# Patient Record
Sex: Female | Born: 1955
Health system: Southern US, Community
[De-identification: ages and names within clinical notes are randomized; demographics above are authoritative.]

## PROBLEM LIST (undated history)

## (undated) DIAGNOSIS — Z9889 Other specified postprocedural states: Secondary | ICD-10-CM

## (undated) DIAGNOSIS — R112 Nausea with vomiting, unspecified: Secondary | ICD-10-CM

## (undated) DIAGNOSIS — M810 Age-related osteoporosis without current pathological fracture: Secondary | ICD-10-CM

## (undated) DIAGNOSIS — Z95810 Presence of automatic (implantable) cardiac defibrillator: Secondary | ICD-10-CM

## (undated) DIAGNOSIS — M1612 Unilateral primary osteoarthritis, left hip: Secondary | ICD-10-CM

## (undated) DIAGNOSIS — I219 Acute myocardial infarction, unspecified: Secondary | ICD-10-CM

## (undated) DIAGNOSIS — G4730 Sleep apnea, unspecified: Secondary | ICD-10-CM

## (undated) HISTORY — PX: CHOLECYSTECTOMY: SHX55

## (undated) HISTORY — PX: KNEE ARTHROSCOPY: SUR90

## (undated) HISTORY — DX: Sleep apnea, unspecified: G47.30

## (undated) HISTORY — PX: SP CHOLECYSTOMY: HXRAD409

## (undated) HISTORY — DX: Age-related osteoporosis without current pathological fracture: M81.0

---

## 2016-06-10 ENCOUNTER — Ambulatory Visit (INDEPENDENT_AMBULATORY_CARE_PROVIDER_SITE_OTHER): Payer: 59 | Admitting: Family Medicine

## 2016-06-10 ENCOUNTER — Encounter: Payer: Self-pay | Admitting: Family Medicine

## 2016-06-10 VITALS — BP 118/74 | HR 72 | Temp 98.1°F | Resp 17 | Ht 67.0 in | Wt 143.0 lb

## 2016-06-10 DIAGNOSIS — H6091 Unspecified otitis externa, right ear: Secondary | ICD-10-CM

## 2016-06-10 MED ORDER — NEOMYCIN-POLYMYXIN-HC 3.5-10000-1 OT SOLN: 3.0000 [drp] | Freq: Four times a day (QID) | OTIC | 1 refills | Status: DC

## 2016-06-10 NOTE — Patient Instructions (Addendum)
It was good to meet you today!  I have sent in the cortisporin otic for your Right ear.  Use this 3-4 drops three to four times a day.   If you're not doing better, come back to see [place] in a week or so. Sooner if worsening.      IF you received an x-ray today, you will receive an invoice from [facility]. Please contact [facility] at [phone] with questions or concerns regarding your invoice.   IF you received labwork today, you will receive an invoice from [employer]. Please contact [facility] at [phone] with questions or concerns regarding your invoice.   Our billing staff will not be able to assist you with questions regarding bills from these companies.  You will be contacted with the lab results as soon as they are available. The fastest way to get your results is to activate your My Chart account. Instructions are located on the last page of this paperwork. If you have not heard from [place] regarding the results in 2 weeks, please contact this office.    We recommend that you schedule a mammogram for breast cancer screening. Typically, you do not need a referral to do this. Please contact a local imaging center to schedule your mammogram.  [facility] - [phone]  *ask for the Radiology Department [facility]) - [phone] or [phone]  [facility] - [phone] [place] [phone] [facility] - [phone]  *ask for the [facility] - [phone]  *ask for the Radiology Department [facility] - [phone]  *ask for the [place] - [phone]

## 2016-06-10 NOTE — Progress Notes (Signed)
Allison Cannon is a 60 y.o. female who presents to Urgent Care today for Ear pain  1.  Right ear pain:  Present for past 2 days.  Resolved this AM.  Hasn't had any further pain today.  No other URI symptoms.  Describes sharp stabbing pain deep within her ear on the Right side.  No trauma.  NO drainage.  No injury.    Husband diagnosed several days ago with otitis externa.  She took some of his vosol drops yesterday evening as well as Alleve, both of which seemed to help. NO fevers or chills.   ROS as above.   PMH reviewed. Patient is a nonsmoker.   No past medical history on file. Past Surgical History:  Procedure Laterality Date  . CHOLECYSTECTOMY      Medications reviewed. No current outpatient prescriptions on file.   No current facility-administered medications for this visit.    Family history of stroke in mother and father. Nonsmoker.    Physical Exam:  BP 118/74 (BP Location: Left Arm, Patient Position: Sitting, Cuff Size: Normal)   Pulse 72   Temp 98.1 F (36.7 C) (Oral)   Resp 17   Ht 5\' 7"  (1.702 m)   Wt 143 lb (64.9 kg)   SpO2 97%   BMI 22.40 kg/m  Gen:  Patient sitting on exam table, appears stated age in no acute distress Head: Normocephalic atraumatic Eyes: EOMI, PERRL, sclera and conjunctiva non-erythematous Ears:  Canals clear with good TM on Left.  Rigth canal with some erythema and edema noted.  TM itself is non-red and normal appearing.  Mouth: Mucosa membranes moist. Tonsils +2, nonenlarged, non-erythematous. Neck: No cervical lymphadenopathy noted Heart:  RRR, no murmurs auscultated. Pulm:  Clear to auscultation bilaterally with good air movement.  No wheezes or rales noted.      Assessment and Plan:  1.  Right otitis externa: - treat with cortisporin otic.  OTC Alleve as needed - she did have some tenderness on ear speculum examination.  - TM not infected.  No evidence of AOM.   - FU if no improvement within the week

## 2018-06-21 DIAGNOSIS — D1801 Hemangioma of skin and subcutaneous tissue: Secondary | ICD-10-CM | POA: Diagnosis not present

## 2018-06-21 DIAGNOSIS — D239 Other benign neoplasm of skin, unspecified: Secondary | ICD-10-CM | POA: Diagnosis not present

## 2018-06-21 DIAGNOSIS — L821 Other seborrheic keratosis: Secondary | ICD-10-CM | POA: Diagnosis not present

## 2019-07-28 DIAGNOSIS — Z1231 Encounter for screening mammogram for malignant neoplasm of breast: Secondary | ICD-10-CM | POA: Diagnosis not present

## 2019-07-28 DIAGNOSIS — Z803 Family history of malignant neoplasm of breast: Secondary | ICD-10-CM | POA: Diagnosis not present

## 2019-11-07 ENCOUNTER — Encounter: Payer: Self-pay | Admitting: Orthopaedic Surgery

## 2019-11-07 ENCOUNTER — Ambulatory Visit: Payer: Self-pay

## 2019-11-07 ENCOUNTER — Other Ambulatory Visit: Payer: Self-pay

## 2019-11-07 ENCOUNTER — Ambulatory Visit (INDEPENDENT_AMBULATORY_CARE_PROVIDER_SITE_OTHER): Payer: BC Managed Care – PPO

## 2019-11-07 ENCOUNTER — Ambulatory Visit (INDEPENDENT_AMBULATORY_CARE_PROVIDER_SITE_OTHER): Payer: BC Managed Care – PPO | Admitting: Orthopaedic Surgery

## 2019-11-07 DIAGNOSIS — M1612 Unilateral primary osteoarthritis, left hip: Secondary | ICD-10-CM

## 2019-11-07 MED ORDER — MELOXICAM 7.5 MG PO TABS: 7.5000 mg | ORAL_TABLET | Freq: Two times a day (BID) | ORAL | 2 refills | Status: DC | PRN

## 2019-11-07 NOTE — Progress Notes (Signed)
Office Visit Note   Patient: Allison Cannon           Date of Birth: 1955/12/03           MRN: [id number] Visit Date: 11/07/2019              Requested by: No referring provider defined for this encounter. PCP: Patient, No Pcp Per   Assessment & Plan: Visit Diagnoses:  1. Primary osteoarthritis of left hip     Plan: Impression is moderate left hip osteoarthritis with recent exacerbation.  Activity restrictions and modifications were discussed today.  Meloxicam prescribed today as well to take as needed.  She is interested in receiving a cortisone injection today which was performed by Dr. Junius Roads.  Patient will follow up with me as needed.  Follow-Up Instructions: Return if symptoms worsen or fail to improve.   Orders:  Orders Placed This Encounter  Procedures  . XR HIP UNILAT W OR W/O PELVIS 2-3 VIEWS LEFT   Meds ordered this encounter  Medications  . meloxicam (MOBIC) 7.5 MG tablet    Sig: Take 1 tablet (7.5 mg total) by mouth 2 (two) times daily as needed for pain.    Dispense:  30 tablet    Refill:  2      Procedures: No procedures performed   Clinical Data: No additional findings.   Subjective: Chief Complaint  Patient presents with  . Left Hip - Pain    Allison Cannon is a very pleasant 64 year old female who is the neighbor Allison Cannon who comes in for evaluation and treatment of chronic left hip pain that has progressively gotten worse for years and especially over the last several weeks.  She feels mainly pain in the groin sometimes will radiate into the buttock.  Occasionally it'll radiate down into the knee and some burning pain in the calf but she doesn't relate endorse any significant back pain.  Denies any previous history of injury or surgery to the left hip.  Advil and Aleve do provide temporary relief.  She is now having trouble sleeping at night due to the pain.   Review of Systems  Constitutional: Negative.   HENT: Negative.   Eyes: Negative.     Respiratory: Negative.   Cardiovascular: Negative.   Endocrine: Negative.   Musculoskeletal: Negative.   Neurological: Negative.   Hematological: Negative.   Psychiatric/Behavioral: Negative.   All other systems reviewed and are negative.    Objective: Vital Signs: There were no vitals taken for this visit.  Physical Exam Vitals and nursing note reviewed.  Constitutional:      Appearance: She is well-developed.  HENT:     Head: Normocephalic and atraumatic.  Pulmonary:     Effort: Pulmonary effort is normal.  Abdominal:     Palpations: Abdomen is soft.  Musculoskeletal:     Cervical back: Neck supple.  Skin:    General: Skin is warm.     Capillary Refill: Capillary refill takes less than 2 seconds.  Neurological:     Mental Status: She is alert and oriented to person, place, and time.  Psychiatric:        Behavior: Behavior normal.        Thought Content: Thought content normal.        Judgment: Judgment normal.     Ortho Exam Left hip exam shows pain with logroll.  Mildly positive Stinchfield and no significant pain with internal and external rotation.  No bony tenderness. Specialty Comments:  No specialty  comments available.  Imaging: No results found.   PMFS History: Patient Active Problem List   Diagnosis Date Noted  . Primary osteoarthritis of left hip 11/07/2019   History reviewed. No pertinent past medical history.  Family History  Problem Relation Age of Onset  . Cancer Mother   . Stroke Mother   . Stroke Father   . Cancer Sister     Past Surgical History:  Procedure Laterality Date  . CHOLECYSTECTOMY     Social History   Occupational History  . Not on file  Tobacco Use  . Smoking status: Never Smoker  . Smokeless tobacco: Never Used  Substance and Sexual Activity  . Alcohol use: Not on file  . Drug use: Not on file  . Sexual activity: Not on file

## 2019-11-07 NOTE — Addendum Note (Signed)
Addended by: Marlyne Beards on: 11/07/2019 01:53 PM   Modules accepted: Orders

## 2019-11-07 NOTE — Progress Notes (Signed)
Subjective: Patient is here for ultrasound-guided intra-articular left hip injection.   Anterior and posterior pain.  Objective:  Good ROM but pain with IR.  Procedure: Ultrasound-guided left hip injection: After sterile prep with Betadine, injected 8 cc 1% lidocaine without epinephrine and 40 mg methylprednisolone using a 22-gauge spinal needle, passing the needle through the iliofemoral ligament into the femoral head/neck junction.  Injectate seen filling joint capsule.  Moderate immediate relief.

## 2020-01-02 ENCOUNTER — Encounter: Payer: Self-pay | Admitting: Orthopaedic Surgery

## 2020-01-02 ENCOUNTER — Other Ambulatory Visit: Payer: Self-pay

## 2020-01-02 ENCOUNTER — Ambulatory Visit: Payer: Self-pay

## 2020-01-02 ENCOUNTER — Ambulatory Visit (INDEPENDENT_AMBULATORY_CARE_PROVIDER_SITE_OTHER): Payer: BC Managed Care – PPO | Admitting: Orthopaedic Surgery

## 2020-01-02 DIAGNOSIS — G8929 Other chronic pain: Secondary | ICD-10-CM | POA: Diagnosis not present

## 2020-01-02 DIAGNOSIS — M1612 Unilateral primary osteoarthritis, left hip: Secondary | ICD-10-CM

## 2020-01-02 DIAGNOSIS — M545 Low back pain: Secondary | ICD-10-CM

## 2020-01-02 MED ORDER — PREDNISONE 10 MG (21) PO TBPK: ORAL_TABLET | ORAL | 0 refills | Status: DC

## 2020-01-02 MED ORDER — METHOCARBAMOL 500 MG PO TABS: 500.0000 mg | ORAL_TABLET | Freq: Two times a day (BID) | ORAL | 0 refills | Status: DC | PRN

## 2020-01-02 NOTE — Progress Notes (Signed)
Office Visit Note   Patient: Allison Cannon           Date of Birth: 11/20/1955           MRN: [id number] Visit Date: 01/02/2020              Requested by: No referring provider defined for this encounter. PCP: Patient, No Pcp Per   Assessment & Plan: Visit Diagnoses:  1. Chronic low back pain, unspecified back pain laterality, unspecified whether sciatica present   2. Unilateral primary osteoarthritis, left hip     Plan: Impression is left lower extremity radiculopathy with underlying left hip osteoarthritis.  At this point we will start the patient on a steroid and muscle relaxer and start her in outpatient physical therapy.  She would like to see Carrolyn Leigh of written her a prescription for this.  She will follow up with [place] as needed.  Follow-Up Instructions: Return if symptoms worsen or fail to improve.   Orders:  Orders Placed This Encounter  Procedures  . XR Lumbar Spine 2-3 Views   Meds ordered this encounter  Medications  . predniSONE (STERAPRED UNI-PAK 21 TAB) 10 MG (21) TBPK tablet    Sig: Take as directed    Dispense:  21 tablet    Refill:  0  . methocarbamol (ROBAXIN) 500 MG tablet    Sig: Take 1 tablet (500 mg total) by mouth 2 (two) times daily as needed.    Dispense:  20 tablet    Refill:  0      Procedures: No procedures performed   Clinical Data: No additional findings.   Subjective: Chief Complaint  Patient presents with  . Lower Back - Pain  . Left Hip - Pain    HPI patient is a pleasant 64 year old female comes in today with continued left lower extremity pain.  The pain she has starts in the left buttocks and radiates down the posterior lateral thigh and into the shin.  She does note occasional pain into the left groin and anterior thigh.  She has had this for the past several years and has progressively worsened.  She remembers to remote injuries 1 from slipping and falling on the ice in the morning falling pretty hard on her left  buttock when she was going down a set of stairs at home.  Both injuries were years ago.  Her pain is aggravated when she is walking for an extended period of time.  She also has pain while sleeping at night.  This does make it difficult to get comfortable.  She notes burning to the anterolateral shin on the left.  No bowel or bladder change and no saddle paresthesias.  She was seen by Dr. Junius Roads where her left hip was injected on 11/07/2019.  She noted moderate relief of symptoms only during the anesthetic phase.  Review of Systems as detailed in HPI.  All others reviewed and are negative.   Objective: Vital Signs: There were no vitals taken for this visit.  Physical Exam well-developed well-nourished female no acute distress.  Alert oriented x3.  Ortho Exam examination of the left hip reveals a minimally positive logroll.  Markedly positive straight leg raise.  Increased pain with lumbar flexion.  Mild tenderness to the left and right lower lumbar paraspinous musculature.  No focal weakness.  She is neurovascular intact distally.  Specialty Comments:  No specialty comments available.  Imaging: XR Lumbar Spine 2-3 Views  Result Date: 01/02/2020 Marked degenerative changes L5-S1  PMFS History: Patient Active Problem List   Diagnosis Date Noted  . Primary osteoarthritis of left hip 11/07/2019   History reviewed. No pertinent past medical history.  Family History  Problem Relation Age of Onset  . Cancer Mother   . Stroke Mother   . Stroke Father   . Cancer Sister     Past Surgical History:  Procedure Laterality Date  . CHOLECYSTECTOMY     Social History   Occupational History  . Not on file  Tobacco Use  . Smoking status: Never Smoker  . Smokeless tobacco: Never Used  Substance and Sexual Activity  . Alcohol use: Not on file  . Drug use: Not on file  . Sexual activity: Not on file

## 2020-01-11 DIAGNOSIS — M545 Low back pain: Secondary | ICD-10-CM | POA: Diagnosis not present

## 2020-01-17 DIAGNOSIS — M545 Low back pain: Secondary | ICD-10-CM | POA: Diagnosis not present

## 2020-01-24 DIAGNOSIS — M545 Low back pain: Secondary | ICD-10-CM | POA: Diagnosis not present

## 2020-02-01 DIAGNOSIS — M545 Low back pain: Secondary | ICD-10-CM | POA: Diagnosis not present

## 2020-02-08 DIAGNOSIS — M545 Low back pain: Secondary | ICD-10-CM | POA: Diagnosis not present

## 2020-02-20 DIAGNOSIS — M545 Low back pain: Secondary | ICD-10-CM | POA: Diagnosis not present

## 2020-02-24 ENCOUNTER — Ambulatory Visit: Payer: BC Managed Care – PPO

## 2020-02-29 DIAGNOSIS — M545 Low back pain: Secondary | ICD-10-CM | POA: Diagnosis not present

## 2020-03-19 ENCOUNTER — Emergency Department (HOSPITAL_COMMUNITY): Payer: BC Managed Care – PPO

## 2020-03-19 ENCOUNTER — Inpatient Hospital Stay (HOSPITAL_COMMUNITY): Payer: BC Managed Care – PPO

## 2020-03-19 ENCOUNTER — Inpatient Hospital Stay (HOSPITAL_COMMUNITY)
Admission: EM | Admit: 2020-03-19 | Discharge: 2020-03-22 | DRG: 224 | Disposition: A | Discharge status: Home / Self Care | Payer: BC Managed Care – PPO | Attending: Internal Medicine | Admitting: Internal Medicine

## 2020-03-19 ENCOUNTER — Encounter (HOSPITAL_COMMUNITY): Payer: Self-pay | Admitting: Cardiology

## 2020-03-19 ENCOUNTER — Other Ambulatory Visit: Payer: Self-pay

## 2020-03-19 DIAGNOSIS — R404 Transient alteration of awareness: Secondary | ICD-10-CM | POA: Diagnosis not present

## 2020-03-19 DIAGNOSIS — Z95810 Presence of automatic (implantable) cardiac defibrillator: Secondary | ICD-10-CM | POA: Diagnosis not present

## 2020-03-19 DIAGNOSIS — R61 Generalized hyperhidrosis: Secondary | ICD-10-CM | POA: Diagnosis not present

## 2020-03-19 DIAGNOSIS — I4901 Ventricular fibrillation: Secondary | ICD-10-CM | POA: Diagnosis not present

## 2020-03-19 DIAGNOSIS — R7989 Other specified abnormal findings of blood chemistry: Secondary | ICD-10-CM

## 2020-03-19 DIAGNOSIS — Z20822 Contact with and (suspected) exposure to covid-19: Secondary | ICD-10-CM | POA: Diagnosis not present

## 2020-03-19 DIAGNOSIS — R4781 Slurred speech: Secondary | ICD-10-CM | POA: Diagnosis present

## 2020-03-19 DIAGNOSIS — I428 Other cardiomyopathies: Secondary | ICD-10-CM | POA: Diagnosis not present

## 2020-03-19 DIAGNOSIS — G8929 Other chronic pain: Secondary | ICD-10-CM | POA: Diagnosis present

## 2020-03-19 DIAGNOSIS — R110 Nausea: Secondary | ICD-10-CM | POA: Diagnosis not present

## 2020-03-19 DIAGNOSIS — M79605 Pain in left leg: Secondary | ICD-10-CM | POA: Diagnosis present

## 2020-03-19 DIAGNOSIS — Z823 Family history of stroke: Secondary | ICD-10-CM | POA: Diagnosis not present

## 2020-03-19 DIAGNOSIS — R7401 Elevation of levels of liver transaminase levels: Secondary | ICD-10-CM | POA: Diagnosis not present

## 2020-03-19 DIAGNOSIS — R569 Unspecified convulsions: Secondary | ICD-10-CM | POA: Diagnosis not present

## 2020-03-19 DIAGNOSIS — J90 Pleural effusion, not elsewhere classified: Secondary | ICD-10-CM | POA: Diagnosis not present

## 2020-03-19 DIAGNOSIS — G4089 Other seizures: Secondary | ICD-10-CM | POA: Diagnosis present

## 2020-03-19 DIAGNOSIS — R1111 Vomiting without nausea: Secondary | ICD-10-CM | POA: Diagnosis not present

## 2020-03-19 DIAGNOSIS — M1612 Unilateral primary osteoarthritis, left hip: Secondary | ICD-10-CM | POA: Diagnosis present

## 2020-03-19 DIAGNOSIS — R270 Ataxia, unspecified: Secondary | ICD-10-CM | POA: Diagnosis not present

## 2020-03-19 DIAGNOSIS — R739 Hyperglycemia, unspecified: Secondary | ICD-10-CM | POA: Diagnosis present

## 2020-03-19 DIAGNOSIS — I469 Cardiac arrest, cause unspecified: Secondary | ICD-10-CM

## 2020-03-19 DIAGNOSIS — R55 Syncope and collapse: Secondary | ICD-10-CM | POA: Diagnosis not present

## 2020-03-19 DIAGNOSIS — R570 Cardiogenic shock: Secondary | ICD-10-CM | POA: Diagnosis not present

## 2020-03-19 DIAGNOSIS — Z9049 Acquired absence of other specified parts of digestive tract: Secondary | ICD-10-CM | POA: Diagnosis not present

## 2020-03-19 DIAGNOSIS — D3610 Benign neoplasm of peripheral nerves and autonomic nervous system, unspecified: Secondary | ICD-10-CM | POA: Diagnosis not present

## 2020-03-19 DIAGNOSIS — I462 Cardiac arrest due to underlying cardiac condition: Secondary | ICD-10-CM | POA: Diagnosis present

## 2020-03-19 DIAGNOSIS — D333 Benign neoplasm of cranial nerves: Secondary | ICD-10-CM | POA: Diagnosis present

## 2020-03-19 DIAGNOSIS — E876 Hypokalemia: Secondary | ICD-10-CM | POA: Diagnosis not present

## 2020-03-19 DIAGNOSIS — I639 Cerebral infarction, unspecified: Secondary | ICD-10-CM | POA: Diagnosis not present

## 2020-03-19 DIAGNOSIS — R29818 Other symptoms and signs involving the nervous system: Secondary | ICD-10-CM | POA: Diagnosis not present

## 2020-03-19 HISTORY — DX: Unilateral primary osteoarthritis, left hip: M16.12

## 2020-03-19 LAB — COMPREHENSIVE METABOLIC PANEL
ALT: 631 U/L — ABNORMAL HIGH (ref 0–44)
AST: 573 U/L — ABNORMAL HIGH (ref 15–41)
Albumin: 3.9 g/dL (ref 3.5–5.0)
Alkaline Phosphatase: 83 U/L (ref 38–126)
Anion gap: 16 — ABNORMAL HIGH (ref 5–15)
BUN: 10 mg/dL (ref 8–23)
CO2: 20 mmol/L — ABNORMAL LOW (ref 22–32)
Calcium: 9.4 mg/dL (ref 8.9–10.3)
Chloride: 103 mmol/L (ref 98–111)
Creatinine, Ser: 0.86 mg/dL (ref 0.44–1.00)
GFR calc Af Amer: >60 mL/min (ref >60)
GFR calc non Af Amer: >60 mL/min (ref >60)
Glucose, Bld: 214 mg/dL — ABNORMAL HIGH (ref 70–99)
Potassium: 3.3 mmol/L — ABNORMAL LOW (ref 3.5–5.1)
Sodium: 139 mmol/L (ref 135–145)
Total Bilirubin: 0.7 mg/dL (ref 0.3–1.2)
Total Protein: 6.4 g/dL — ABNORMAL LOW (ref 6.5–8.1)

## 2020-03-19 LAB — CBC
HCT: 41.4 % (ref 36.0–46.0)
Hemoglobin: 14 g/dL (ref 12.0–15.0)
MCH: 29.7 pg (ref 26.0–34.0)
MCHC: 33.8 g/dL (ref 30.0–36.0)
MCV: 87.9 fL (ref 80.0–100.0)
Platelets: 256 10*3/uL (ref 150–400)
RBC: 4.71 MIL/uL (ref 3.87–5.11)
RDW: 12.4 % (ref 11.5–15.5)
WBC: 11.5 10*3/uL — ABNORMAL HIGH (ref 4.0–10.5)
nRBC: 0 % (ref 0.0–0.2)

## 2020-03-19 LAB — I-STAT CHEM 8, ED
BUN: 10 mg/dL (ref 8–23)
Calcium, Ion: 1.08 mmol/L — ABNORMAL LOW (ref 1.15–1.40)
Chloride: 103 mmol/L (ref 98–111)
Creatinine, Ser: 0.6 mg/dL (ref 0.44–1.00)
Glucose, Bld: 209 mg/dL — ABNORMAL HIGH (ref 70–99)
HCT: 40 % (ref 36.0–46.0)
Hemoglobin: 13.6 g/dL (ref 12.0–15.0)
Potassium: 3.3 mmol/L — ABNORMAL LOW (ref 3.5–5.1)
Sodium: 139 mmol/L (ref 135–145)
TCO2: 22 mmol/L (ref 22–32)

## 2020-03-19 LAB — ECHOCARDIOGRAM COMPLETE
Height: 67 in
Weight: 2160 oz

## 2020-03-19 LAB — DIFFERENTIAL
Abs Immature Granulocytes: 0.1 10*3/uL — ABNORMAL HIGH (ref 0.00–0.07)
Basophils Absolute: 0.1 10*3/uL (ref 0.0–0.1)
Basophils Relative: 1 %
Eosinophils Absolute: 0.1 10*3/uL (ref 0.0–0.5)
Eosinophils Relative: 1 %
Immature Granulocytes: 1 %
Lymphocytes Relative: 27 %
Lymphs Abs: 3.1 10*3/uL (ref 0.7–4.0)
Monocytes Absolute: 0.6 10*3/uL (ref 0.1–1.0)
Monocytes Relative: 5 %
Neutro Abs: 7.5 10*3/uL (ref 1.7–7.7)
Neutrophils Relative %: 65 %

## 2020-03-19 LAB — ETHANOL: Alcohol, Ethyl (B): <10 mg/dL (ref <10)

## 2020-03-19 LAB — TSH: TSH: 1.195 u[IU]/mL (ref 0.350–4.500)

## 2020-03-19 LAB — HEMOGLOBIN A1C
Hgb A1c MFr Bld: 5.5 % (ref 4.8–5.6)
Mean Plasma Glucose: 111.15 mg/dL

## 2020-03-19 LAB — TROPONIN I (HIGH SENSITIVITY)
Troponin I (High Sensitivity): 8 ng/L (ref <18)
Troponin I (High Sensitivity): 88 ng/L — ABNORMAL HIGH (ref <18)

## 2020-03-19 LAB — APTT: aPTT: 24 seconds (ref 24–36)

## 2020-03-19 LAB — PROTIME-INR
INR: 1 (ref 0.8–1.2)
Prothrombin Time: 13.2 seconds (ref 11.4–15.2)

## 2020-03-19 LAB — CK: Total CK: 82 U/L (ref 38–234)

## 2020-03-19 LAB — MAGNESIUM: Magnesium: 1.6 mg/dL — ABNORMAL LOW (ref 1.7–2.4)

## 2020-03-19 LAB — D-DIMER, QUANTITATIVE: D-Dimer, Quant: 10.36 ug/mL-FEU — ABNORMAL HIGH (ref 0.00–0.50)

## 2020-03-19 LAB — T4, FREE: Free T4: 1.12 ng/dL (ref 0.61–1.12)

## 2020-03-19 LAB — SARS CORONAVIRUS 2 BY RT PCR (HOSPITAL ORDER, PERFORMED IN ~~LOC~~ HOSPITAL LAB): SARS Coronavirus 2: NEGATIVE

## 2020-03-19 MED ORDER — IOHEXOL 350 MG/ML SOLN
100.0000 mL | Freq: Once | INTRAVENOUS | Status: AC | PRN
  Administered 2020-03-19: 75 mL via INTRAVENOUS

## 2020-03-19 MED ORDER — DIPHENHYDRAMINE HCL 25 MG PO CAPS: 25.0000 mg | ORAL_CAPSULE | Freq: Four times a day (QID) | ORAL | Status: DC | PRN

## 2020-03-19 MED ORDER — ONDANSETRON HCL 4 MG/2ML IJ SOLN: 4.0000 mg | Freq: Once | INTRAMUSCULAR | Status: DC

## 2020-03-19 MED ORDER — POTASSIUM CHLORIDE CRYS ER 20 MEQ PO TBCR
40.0000 meq | EXTENDED_RELEASE_TABLET | Freq: Once | ORAL | Status: AC
  Administered 2020-03-19: 40 meq via ORAL
  Filled 2020-03-19: qty 2

## 2020-03-19 MED ORDER — PROMETHAZINE HCL 25 MG/ML IJ SOLN
12.5000 mg | Freq: Once | INTRAMUSCULAR | Status: AC
  Administered 2020-03-19: 12.5 mg via INTRAVENOUS
  Filled 2020-03-19: qty 1

## 2020-03-19 MED ORDER — ENOXAPARIN SODIUM 60 MG/0.6ML ~~LOC~~ SOLN
1.0000 mg/kg | Freq: Two times a day (BID) | SUBCUTANEOUS | Status: DC
  Administered 2020-03-19 – 2020-03-20 (×2): 60 mg via SUBCUTANEOUS
  Filled 2020-03-19 (×3): qty 0.6

## 2020-03-19 MED ORDER — LORAZEPAM 2 MG/ML IJ SOLN
0.5000 mg | Freq: Once | INTRAMUSCULAR | Status: AC
  Administered 2020-03-19: 0.5 mg via INTRAVENOUS
  Filled 2020-03-19: qty 1

## 2020-03-19 MED ORDER — ASPIRIN EC 81 MG PO TBEC
81.0000 mg | DELAYED_RELEASE_TABLET | Freq: Every day | ORAL | Status: DC
  Administered 2020-03-19 – 2020-03-22 (×3): 81 mg via ORAL
  Filled 2020-03-19 (×3): qty 1

## 2020-03-19 MED ORDER — ACETAMINOPHEN 650 MG RE SUPP: 650.0000 mg | Freq: Four times a day (QID) | RECTAL | Status: DC | PRN

## 2020-03-19 MED ORDER — DIPHENHYDRAMINE-APAP (SLEEP) 25-500 MG PO TABS: 1.0000 | ORAL_TABLET | Freq: Every evening | ORAL | Status: DC | PRN

## 2020-03-19 MED ORDER — ACETAMINOPHEN 325 MG PO TABS
650.0000 mg | ORAL_TABLET | Freq: Four times a day (QID) | ORAL | Status: DC | PRN
  Administered 2020-03-19 – 2020-03-22 (×4): 650 mg via ORAL
  Filled 2020-03-19 (×5): qty 2

## 2020-03-19 MED ORDER — SODIUM CHLORIDE 0.9 % IV BOLUS
1000.0000 mL | Freq: Once | INTRAVENOUS | Status: AC
  Administered 2020-03-19: 1000 mL via INTRAVENOUS

## 2020-03-19 MED ORDER — ONDANSETRON HCL 4 MG/2ML IJ SOLN
INTRAMUSCULAR | Status: AC
  Administered 2020-03-19: 4 mg
  Filled 2020-03-19: qty 2

## 2020-03-19 MED ORDER — MAGNESIUM SULFATE 2 GM/50ML IV SOLN
2.0000 g | Freq: Once | INTRAVENOUS | Status: AC
  Administered 2020-03-19: 2 g via INTRAVENOUS
  Filled 2020-03-19: qty 50

## 2020-03-19 MED ORDER — ENOXAPARIN SODIUM 40 MG/0.4ML ~~LOC~~ SOLN: 40.0000 mg | SUBCUTANEOUS | Status: DC

## 2020-03-19 NOTE — Consult Note (Addendum)
Cardiology Consultation:   Patient ID: Allison Cannon MRN: [id number]; DOB: 05-17-1956 Pt with 2 medical record numbers.  Admit date: 03/19/2020 Date of Consult: 03/19/2020  Primary Care Provider: Patient, No Pcp Per Primary Cardiologist: No primary care provider on file. new Primary Electrophysiologist:  None    Patient Profile:   Allison Cannon is a 64 y.o. female with a hx of unknown PMH who is being seen today for the evaluation of cardiac arrest at the request of Dr Sedonia Small.  History of Present Illness:   Allison Cannon was in step class at [facility] and collapsed, had 5 min of CPR and 1 shock given from [place].  Pulse present with EMS arrival. But jerking behavior and speech disturbance.     +nausea, has rec'd Zofran and phergan for severe nausea.  CODE Stroke called. CT of head with no acute issues.  Per neuro enroute to hospital she had head trun to left and some upper and lower body jerking. She was diaphoretic and incontinent.  She was slow to respond initially but improved. + nausea.  Usually in good health with biking yesterday about 7 miles and swimming.  She does walk up to 7-9 miles at times.  No chest pain prior to this event. No SOB.  Drinks 2 cups of coffee per day.  Was in her usual state of health. No pre-warning.  Never to her knowledge did she have COVID and has had vaccine.    Now with chest pain due to CPR, increases with palpation.   EKG:  The EKG was personally reviewed and demonstrates:  ST at 101, RAD, no acute ST elevation.  Telemetry:  Telemetry was personally reviewed and demonstrates:  SR CXR negative portable chest.  CTA of head and neck IMPRESSION: 1. Negative for large vessel occlusion. 2. Mild for age atherosclerosis, most pronounced in the dominant left vertebral V4 segment. No significant arterial stenosis in the head or neck.   Labs: Troponin hs 8 second  INR 1.0 ETOH <10 hgb 14, WBC 11.5 plts 256  Na 139, K+ 3.3, C02 20, glucose 214, Cr 0.86, AST 573  ALT 631  Gap 16   Past Medical History:  Diagnosis Date   Arthritis of left hip     Past Surgical History:  Procedure Laterality Date   SP CHOLECYSTOMY       Home Medications:  Prior to Admission medications   Medication Sig Start Date End Date Taking? Authorizing Provider  diphenhydrAMINE (BENADRYL) 25 MG tablet Take 25 mg by mouth every 6 (six) hours as needed for allergies (cough).   Yes [provider]  diphenhydramine-acetaminophen (TYLENOL PM) 25-500 MG TABS tablet Take 1 tablet by mouth at bedtime as needed (pain).   Yes [provider]  ibuprofen (ADVIL) 200 MG tablet Take 200 mg by mouth every 6 (six) hours as needed for moderate pain.   Yes [provider]  naproxen sodium (ALEVE) 220 MG tablet Take 220 mg by mouth daily as needed (pain).   Yes [provider]  Also Vitamin C and D at times and some zinc   Inpatient Medications: Scheduled Meds:  ondansetron (ZOFRAN) IV  4 mg Intravenous Once   potassium chloride  40 mEq Oral Once   Continuous Infusions:  PRN Meds:   Allergies:   Not on File  Social History:   Social History   Socioeconomic History   Marital status: Married    Spouse name: Not on file   Number of children: Not  on file   Years of education: Not on file   Highest education level: Not on file  Occupational History   Not on file  Tobacco Use   Smoking status: Never Smoker   Smokeless tobacco: Never Used  Substance and Sexual Activity   Alcohol use: Not Currently   Drug use: Never   Sexual activity: Not on file  Other Topics Concern   Not on file  Social History Narrative   Not on file   Social Determinants of Health   Financial Resource Strain:    Difficulty of Paying Living Expenses:   Food Insecurity:    Worried About [place] in the Last Year:    Arboriculturist in the Last Year:   Transportation Needs:    Film/video editor (Medical):    Lack of  Transportation (Non-Medical):   Physical Activity:    Days of Exercise per Week:    Minutes of Exercise per Session:   Stress:    Feeling of Stress :   Social Connections:    Frequency of Communication with Friends and Family:    Frequency of Social Gatherings with Friends and Family:    Attends Religious Services:    Active Member of Clubs or Organizations:    Attends Music therapist:    Marital Status:   Intimate Partner Violence:    Fear of Current or Ex-Partner:    Emotionally Abused:    Physically Abused:    Sexually Abused:     Family History:    Family History  Problem Relation Age of Onset   Stroke Mother    Stroke Father      ROS:  Please see the history of present illness.  General:no colds or fevers, no weight changes Skin:no rashes or ulcers HEENT:no blurred vision, no congestion CV:see HPI PUL:see HPI GI:no diarrhea constipation or melena, no indigestion GU:no hematuria, no dysuria MS:no joint pain, no claudication Neuro:no syncope, no lightheadedness- cardiac arrest today. Endo:no diabetes, no thyroid disease  All other ROS reviewed and negative.     Physical Exam/Data:   Vitals:   03/19/20 1000 03/19/20 1003  BP: 128/75 128/75  Pulse: 91 99  Resp: 16 17  Temp:  97.8 F (36.6 C)  TempSrc:  Oral  SpO2:  100%   No intake or output data in the 24 hours ending 03/19/20 1225 No flowsheet data found.   There is no height or weight on file to calculate BMI.  General:  Thin female, in no acute distress though somewhat stunned HEENT: normal Lymph: no adenopathy Neck: no JVD Endocrine:  No thryomegaly Vascular: No carotid bruits; pedal pulses 2+ bilaterally  Cardiac:  normal S1, S2; RRR; no murmur gallup rub or click Lungs:  clear to auscultation bilaterally, no wheezing, rhonchi or rales  Abd: soft, nontender, no hepatomegaly  Ext: no edema Musculoskeletal:  No deformities, BUE and BLE strength normal and  equal Skin: warm and dry  Neuro:  Alert and oriented X 3 MAE follows commands , no focal abnormalities noted Psych:  Normal affect     Relevant CV Studies: Echo pending  Laboratory Data:  High Sensitivity Troponin:   Recent Labs  Lab 03/19/20 1008  TROPONINIHS 8     Chemistry Recent Labs  Lab 03/19/20 1008 03/19/20 1015  NA 139 139  K 3.3* 3.3*  CL 103 103  CO2 20*  --   GLUCOSE 214* 209*  BUN 10 10  CREATININE 0.86 0.60  CALCIUM 9.4  --   GFRNONAA >60  --   GFRAA >60  --   ANIONGAP 16*  --     Recent Labs  Lab 03/19/20 1008  PROT 6.4*  ALBUMIN 3.9  AST 573*  ALT 631*  ALKPHOS 83  BILITOT 0.7   Hematology Recent Labs  Lab 03/19/20 1008 03/19/20 1015  WBC 11.5*  --   RBC 4.71  --   HGB 14.0 13.6  HCT 41.4 40.0  MCV 87.9  --   MCH 29.7  --   MCHC 33.8  --   RDW 12.4  --   PLT 256  --    BNPNo results for input(s): BNP, PROBNP in the last 168 hours.  DDimer No results for input(s): DDIMER in the last 168 hours.   Radiology/Studies:  CT ANGIO NECK W OR WO CONTRAST  Result Date: 03/19/2020 CLINICAL DATA:  64 year old female code stroke presentation. EXAM: CT ANGIOGRAPHY HEAD AND NECK TECHNIQUE: Multidetector CT imaging of the head and neck was performed using the standard protocol during bolus administration of intravenous contrast. Multiplanar CT image reconstructions and MIPs were obtained to evaluate the vascular anatomy. Carotid stenosis measurements (when applicable) are obtained utilizing NASCET criteria, using the distal internal carotid diameter as the denominator. CONTRAST:  31mL OMNIPAQUE IOHEXOL 350 MG/ML SOLN COMPARISON:  Plain head CT  1014 hours today. FINDINGS: CTA NECK Skeleton: Chronic C5-C6 disc and endplate degeneration in the cervical spine. No acute osseous abnormality identified. Upper chest: Negative. Other neck: Negative. Aortic arch: 3 vessel arch configuration. No arch atherosclerosis identified. Right carotid system: Negative.  Left carotid system: Negative. Vertebral arteries: Proximal right subclavian artery and right vertebral artery origin are normal. The right vertebral is mildly tortuous and patent to the skull base without stenosis. No proximal left subclavian artery or left vertebral artery origin plaque or stenosis. Mildly dominant left vertebral is tortuous and patent to the skull base without stenosis. CTA HEAD A multiphase intracranial CTA was performed although accidentally, the vertex was excluded. Posterior circulation: Dominant left V4 segment with mild calcified plaque, only mild left V4 segment stenosis. Minimal right V4 plaque without stenosis. Normal right PICA origin. Patent vertebrobasilar junction is tortuous without stenosis. Patent basilar artery without stenosis. Normal SCA and PCA origins. Posterior communicating arteries are diminutive or absent. Bilateral PCA branches are within normal limits. Anterior circulation: Both ICA siphons are patent. Minimal siphon plaque and no stenosis. Patent carotid termini. Normal MCA and ACA origins. Anterior communicating artery and visible ACA branches are within normal limits. Left MCA M1 segment and bifurcation are patent without stenosis. Left MCA branches are within normal limits. Right MCA M1 segment and trifurcation are patent. There is no right M2 branch occlusion. And no convincing right MCA branch occlusion. Multiphase: No additional findings. Venous sinuses: Superior sagittal sinus not included. Otherwise patent. Anatomic variants: Dominant left vertebral artery. Review of the MIP images confirms the above findings IMPRESSION: 1. Negative for large vessel occlusion. 2. Mild for age atherosclerosis, most pronounced in the dominant left vertebral V4 segment. No significant arterial stenosis in the head or neck. Electronically Signed   By: Genevie Ann M.D.   On: 03/19/2020 11:50   DG Chest Port 1 View  Result Date: 03/19/2020 CLINICAL DATA:  Cardiac arrest with CPR EXAM:  PORTABLE CHEST 1 VIEW COMPARISON:  None. FINDINGS: Normal heart size and mediastinal contours. No acute infiltrate or edema. Minimal retrocardiac scarring. No effusion or pneumothorax. No acute osseous findings. Artifact from EKG  leads. IMPRESSION: Negative portable chest. Electronically Signed   By: Monte Fantasia M.D.   On: 03/19/2020 10:46   CT HEAD CODE STROKE WO CONTRAST  Result Date: 03/19/2020 CLINICAL DATA:  Code stroke.  64 year old female with ataxia. EXAM: CT HEAD WITHOUT CONTRAST TECHNIQUE: Contiguous axial images were obtained from the base of the skull through the vertex without intravenous contrast. COMPARISON:  None. FINDINGS: Brain: Cerebral volume is within normal limits for age. No midline shift, mass effect, or evidence of intracranial mass lesion. No ventriculomegaly. No acute intracranial hemorrhage identified. Gray-white matter differentiation is within normal limits for age. No acute cortically based infarct or chronic cortical encephalomalacia identified. Posterior fossa gray-white matter differentiation within normal limits. Vascular: Dominant left vertebral artery. Mild Calcified atherosclerosis at the skull base. No suspicious intracranial vascular hyperdensity. Skull: No acute osseous abnormality identified. Sinuses/Orbits: Visualized paranasal sinuses and mastoids are clear. Other: No acute orbit or scalp soft tissue finding. ASPECTS [facility] Stroke Program Early CT Score) Total score (0-10 with 10 being normal): 10 IMPRESSION: 1. Normal for age non contrast CT appearance of the brain. ASPECTS 10. 2. These results were communicated to Dr. Cheral Marker at 10:21 am on 03/19/2020 by text page via the [facility] messaging system. Electronically Signed   By: Genevie Ann M.D.   On: 03/19/2020 10:21   CT ANGIO HEAD CODE STROKE  Result Date: 03/19/2020 CLINICAL DATA:  64 year old female code stroke presentation. EXAM: CT ANGIOGRAPHY HEAD AND NECK TECHNIQUE: Multidetector CT imaging of the head and neck  was performed using the standard protocol during bolus administration of intravenous contrast. Multiplanar CT image reconstructions and MIPs were obtained to evaluate the vascular anatomy. Carotid stenosis measurements (when applicable) are obtained utilizing NASCET criteria, using the distal internal carotid diameter as the denominator. CONTRAST:  93mL OMNIPAQUE IOHEXOL 350 MG/ML SOLN COMPARISON:  Plain head CT  1014 hours today. FINDINGS: CTA NECK Skeleton: Chronic C5-C6 disc and endplate degeneration in the cervical spine. No acute osseous abnormality identified. Upper chest: Negative. Other neck: Negative. Aortic arch: 3 vessel arch configuration. No arch atherosclerosis identified. Right carotid system: Negative. Left carotid system: Negative. Vertebral arteries: Proximal right subclavian artery and right vertebral artery origin are normal. The right vertebral is mildly tortuous and patent to the skull base without stenosis. No proximal left subclavian artery or left vertebral artery origin plaque or stenosis. Mildly dominant left vertebral is tortuous and patent to the skull base without stenosis. CTA HEAD A multiphase intracranial CTA was performed although accidentally, the vertex was excluded. Posterior circulation: Dominant left V4 segment with mild calcified plaque, only mild left V4 segment stenosis. Minimal right V4 plaque without stenosis. Normal right PICA origin. Patent vertebrobasilar junction is tortuous without stenosis. Patent basilar artery without stenosis. Normal SCA and PCA origins. Posterior communicating arteries are diminutive or absent. Bilateral PCA branches are within normal limits. Anterior circulation: Both ICA siphons are patent. Minimal siphon plaque and no stenosis. Patent carotid termini. Normal MCA and ACA origins. Anterior communicating artery and visible ACA branches are within normal limits. Left MCA M1 segment and bifurcation are patent without stenosis. Left MCA branches are  within normal limits. Right MCA M1 segment and trifurcation are patent. There is no right M2 branch occlusion. And no convincing right MCA branch occlusion. Multiphase: No additional findings. Venous sinuses: Superior sagittal sinus not included. Otherwise patent. Anatomic variants: Dominant left vertebral artery. Review of the MIP images confirms the above findings IMPRESSION: 1. Negative for large vessel occlusion. 2. Mild for  age atherosclerosis, most pronounced in the dominant left vertebral V4 segment. No significant arterial stenosis in the head or neck. Electronically Signed   By: Genevie Ann M.D.   On: 03/19/2020 11:50        No CHest pain.   Assessment and Plan:   1. Cardiac and respiratory arrest, with AED shock- presumed V fib. No acute ST changes and first HS troponin 8.  K+ 3.3- replace and check Mg+, may have been primary arrhythmia, will check Echo and possible cardiac cath to eval. Depending on echo.  Dr. [name] to see.  In SR now--ddimer is pending. She has had vaccine concern for clots.  But should be admitted to ICU for eval.  And if no reason will have  2. Garble speech post event, with CT head without bleed, and follow up without acute process, neuro has seen 3. Elevated LFTs  No old labs to compare. 4. Possible seizure, garbled speech -neuro is following.       For questions or updates, please contact [place] Please consult [email or web address] for contact info under     Signed, Cecilie Kicks, NP  03/19/2020 12:25 PM    I have personally seen and examined this patient. I agree with the assessment and plan as outlined above.  64 yo female with joint disease but no major medical problems presenting to the ED via EMS following a syncopal event and apparent cardiac arrest. She was at the gym exercising and without warning, she passed out. Bystanders started CPR and the AED advised a single shock at which time she regained consciousness. During EMS transport she had some jerking  movements. Stroke workup is thus far negative.  At this time, she has no dyspnea. She has chest wall pain post CPR  EKG reviewed by me with sinus rhythm Tele with sinus rhythm She feels at baseline other than chest wall pain My exam:  General: Well developed, well nourished, NAD  HEENT: OP clear, mucus membranes moist  SKIN: warm, dry. No rashes. Neuro: No focal deficits  Musculoskeletal: Muscle strength 5/5 all ext  Psychiatric: Mood and affect normal  Neck: No JVD, no carotid bruits, no thyromegaly, no lymphadenopathy.  Lungs:Clear bilaterally, no wheezes, rhonci, crackles Cardiovascular: Regular rate and rhythm. No murmurs, gallops or rubs. Abdomen:Soft. Bowel sounds present. Non-tender.  Extremities: No lower extremity edema. Pulses are 2 + in the bilateral DP/PT.  Plan: Syncope/Cardiac and Respiratory Arrest: She has no ischemic changes on her EKG. Troponin negative thus far. She had both Covid vaccine shots but has had no LE edema or dyspnea at home to suggest a post vaccine embolic/hypercoagulable event such as PE. D-dimer is pending. No exertional chest pain at home and no risk factors for CAD.  -Agree with admission and monitoring on tele -Echo to assess LV function -Cycle troponin -Will consider an ischemic evaluation with cardiac cath or coronary CTA pending echo results.  - If no etiology of the event is found, will involve our EP team to discuss an ICD.   Lauree Chandler 03/19/2020 1:12 PM

## 2020-03-19 NOTE — Plan of Care (Signed)

## 2020-03-19 NOTE — Progress Notes (Signed)
EEG completed, results pending. 

## 2020-03-19 NOTE — Consult Note (Addendum)
Neurology Consultation  Reason for Consult: Code stroke  Referring Physician: Dr.Bero  CC: Syncopal episodes with strokelike symptoms  History is obtained from: EMS  HPI: Allison Cannon is a 64 y.o. female with no past medical history.  Patient was apparently in a workout class at the [facility].  At [phone] the patient collapsed.  AED was placed on patient, detecting a shockable rhythm.  She received 1 shock with no further shocks recommended.  CPR was given by staff.  On arrival EMS noted patient had a normal rhythm, but also appreciated left gaze deviation and garbles speech described as word salad.  While en route she was note to have her head turned to the left with bilateral upper and lower body jerking; legs were bent up to her stomach along with bilateral flailing of arms.  She was also diaphoretic and ncontinent.  On arrival to the emergency department, the patient was slow to respond initially but then improved.  There were no localizing or lateralizing abnormalities.  The patient was able to speak clearly.  No seizure-like activity was noted.  LKW: 8:35 AM today tpa given?: no, neurological deficits resolved Premorbid modified Rankin scale (mRS): 0 NIH stroke scale: 0  No past medical history, including no hypertension, prior seizure or stroke.  No family history of stroke and/or seizure.  Social History:   has no history on file for tobacco, alcohol, and drug.  Medications  Current Facility-Administered Medications:  .  ondansetron (ZOFRAN) injection 4 mg, 4 mg, Intravenous, Once, Bero, Barth Kirks, MD No current outpatient medications on file.  ROS: Positive for nausea. No other complaints. Detailed ROS deferred in the context of acuity of presentation.    General ROS: negative for - chills, fatigue, fever, night sweats, weight gain or weight loss Psychological ROS: negative for - behavioral disorder, hallucinations, memory difficulties, mood swings or suicidal ideation Ophthalmic  ROS: negative for - blurry vision, double vision, eye pain or loss of vision ENT ROS: negative for - epistaxis, nasal discharge, oral lesions, sore throat, tinnitus or vertigo Allergy and Immunology ROS: negative for - hives or itchy/watery eyes Hematological and Lymphatic ROS: negative for - bleeding problems, bruising or swollen lymph nodes Endocrine ROS: negative for - galactorrhea, hair pattern changes, polydipsia/polyuria or temperature intolerance Respiratory ROS: negative for - cough, hemoptysis, shortness of breath or wheezing Cardiovascular ROS: negative for - chest pain, dyspnea on exertion, edema or irregular heartbeat Gastrointestinal ROS: negative for - abdominal pain, diarrhea, hematemesis, nausea/vomiting or stool incontinence Genito-Urinary ROS: negative for - dysuria, hematuria, incontinence or urinary frequency/urgency Musculoskeletal ROS: negative for - joint swelling or muscular weakness Neurological ROS: as noted in HPI Dermatological ROS: negative for rash and skin lesion changes  Exam: Current vital signs: BP 128/75 (BP Location: Right Arm)   Pulse 99   Temp 97.8 F (36.6 C) (Oral)   Resp 17   SpO2 100%  Vital signs in last 24 hours: Temp:  [97.8 F (36.6 C)] 97.8 F (36.6 C) (06/01 1003) Pulse Rate:  [91-99] 99 (06/01 1003) Resp:  [16-17] 17 (06/01 1003) BP: (128)/(75) 128/75 (06/01 1003) SpO2:  [100 %] 100 % (06/01 1003)   Constitutional: Appears well-developed and well-nourished.  Psych: Affect appropriate to situation Eyes: No scleral injection HENT: No OP obstrucion Head: Normocephalic.  Cardiovascular: Normal rate and regular rhythm.  Respiratory: Effort normal, non-labored breathing GI: Soft.  No distension. There is no tenderness.  Skin: WDI  Neuro: Mental Status: Patient is awake, alert, oriented to person,  place, month, not year, and situation. Speech-intact naming, repeating, comprehension.  Intact comprehension of three-step commands.  No  dysarthria or aphasia.  She does not recall the event at the exercise facility; her first memory after her collapse is of her being transported in an ambulance. Cranial Nerves: II: Visual Fields are full. PERRL.  III,IV, VI: EOMI without ptosis or diplopia. V: Facial sensation is symmetric to temperature VII: Facial movement is symmetric.  VIII: hearing is intact to voice X: Palate elevates symmetrically XI: Shoulder shrug is symmetric. XII: tongue is midline without atrophy or fasciculations.  Motor: Tone is normal. Bulk is normal. 5/5 strength was present in all four extremities.  No drift Sensory: Sensation is symmetric to light touch and temperature in the arms and legs. DSS intact Deep Tendon Reflexes: 2+  biceps and 3+ bilateral patellae and 2+ bilateral ankle jerk Plantars: Equivocal bilaterally Cerebellar: FNF and HKS are intact bilaterally  Labs I have reviewed labs in epic and the results pertinent to this consultation are:   CBC    Component Value Date/Time   WBC 11.5 (H) 03/19/2020 1008   RBC 4.71 03/19/2020 1008   HGB 13.6 03/19/2020 1015   HCT 40.0 03/19/2020 1015   PLT 256 03/19/2020 1008   MCV 87.9 03/19/2020 1008   MCH 29.7 03/19/2020 1008   MCHC 33.8 03/19/2020 1008   RDW 12.4 03/19/2020 1008   LYMPHSABS 3.1 03/19/2020 1008   MONOABS 0.6 03/19/2020 1008   EOSABS 0.1 03/19/2020 1008   BASOSABS 0.1 03/19/2020 1008    CMP     Component Value Date/Time   NA 139 03/19/2020 1015   K 3.3 (L) 03/19/2020 1015   CL 103 03/19/2020 1015   GLUCOSE 209 (H) 03/19/2020 1015   BUN 10 03/19/2020 1015   CREATININE 0.60 03/19/2020 1015    Imaging I have reviewed the images obtained:  CT-scan of the brain-showed no acute abnormality  CTA head and neck-negative for large vessel occlusion.  Mild for age atherosclerosis, most pronounced in the dominant left vertebral V4 segment.  No significant arterial stenosis in the head or neck.  Etta Quill PA-C [facility]  Neurohospitalist [phone] 03/19/2020, 11:06 AM   Assessment: 64 year old female with no past medical history presenting to the emergency department after an episode of acute LOC while working out, with shockable rhythm detected by AED placed on her at the exercise facility where she collapsed.  This was followed by seizure-like activity at the facility and during EMS transport.  Per staff at the [facility] she was altered for approximately 5 minutes. EMS did note eye deviation and dysp-hasia resembling word salad as well as some shaking.  On arrival to ED patient was somewhat with depressed mentation and appeared postictal, but had no focal deficits.   1. DDx: Most likely the patient suffered from a syncopal episode secondary to an acute arrhythmia, followed by syncopal seizure.   2. Arrhythmia secondary to a first time seizure is also possible, but is felt to be less likely.   Recommendations: 1.  MRI brain 2.  EEG 3.  Cardiology consult  I have seen and examined the patient. I have formulated the assessment and recommendations. 64 year old female with probable syncopal seizure precipitated by an arrhythmia during strenuous exercise. Exam in the ED is nonfocal. Recommendations include MRI brain, EEG and Cardiology consult.  Electronically signed: Dr. Kerney Elbe

## 2020-03-19 NOTE — Progress Notes (Signed)
Pt unavailable for EEG. Pt went to MRI. Will attempt later when schedule permits

## 2020-03-19 NOTE — ED Triage Notes (Signed)
[facility] to ED [facility] from [facility] [place], was working out in a step class, witneesed collapse, placed on AED, shock indicated, shocked x 1, regained consciousness, was not answering questions initially, began talking approx 15 minutes afte event. Per EMS - attempted to talk, was sticking tongue out, words were garbled.

## 2020-03-19 NOTE — Progress Notes (Signed)
Echocardiogram 2D Echocardiogram has been performed.  Allison Cannon 03/19/2020, 3:46 PM

## 2020-03-19 NOTE — Code Documentation (Signed)
Pt in workout class at [facility], witnessed collapse, CPR started, AED advised shock, shock delivered. [facility]EM[facility] arrived and found patient disoriented/aphasic with spastic movement all over her body, a left gaze, and very nauseated. Pt taken to [facility] and code stroke called. Pt was LKW at [place]. Pt taken to Trauma C and evaluated by EDP and cleared for CT. CT/CTA completed. Pt's NIHSS 0 at this time. She was given one dose of Zofran and then Phenergan for severe nausea. No headache or complaints of pain. No TPA r/t symptoms resolved. Care Plan: q2 neuro checks and vitals x12 hours then q4, routine MRI, EEG, and cardiac consult per Neuro MD. Will call new code stroke if symptoms return. Hand off with Delia Chimes. Avabella Wailes, Rande Brunt, RN, SCRN

## 2020-03-19 NOTE — Procedures (Signed)
[name]  MRN: [id number]  Epilepsy Attending: Lora Havens  Referring Physician/Provider: Etta Quill, PA Date: 03/19/2020 Duration: 24.15 mins  Patient history:  64 year old female with no past medical history presenting to the emergency department after a syncopal episode followed by seizure-like jerking that lasted briefly. EEG to evaluate for seizure  Level of alertness: Awake, asleep  AEDs during EEG study: None  Technical aspects: This EEG study was done with scalp electrodes positioned according to the 10-20 International system of electrode placement. Electrical activity was acquired at a sampling rate of 500Hz  and reviewed with a high frequency filter of 70Hz  and a low frequency filter of 1Hz . EEG data were recorded continuously and digitally stored.   Description: The posterior dominant rhythm consists of 8.5-9 Hz activity of moderate voltage (25-35 uV) seen predominantly in posterior head regions, symmetric and reactive to eye opening and eye closing. Sleep was characterized by vertex waves, maximal frontocentral region.   Hyperventilation and photic stimulation were not performed.     IMPRESSION: This study is within normal limits. No seizures or epileptiform discharges were seen throughout the recording.  Allison Cannon

## 2020-03-19 NOTE — ED Notes (Signed)
Per Dorene Ar NP with cardiology and Zhang with [facility] pt is appropriate for progressive on 6e

## 2020-03-19 NOTE — ED Provider Notes (Signed)
[facility] Emergency Department Provider Note MRN:  [id number]  Arrival date & time: 03/19/20     Chief Complaint   post CPR   History of Present Illness   Allison Cannon is a 64 y.o. year-old female with unknown past medical history presenting to the ED with chief complaint of post CPR.  Per report patient collapsed at the [facility], received 5 total minutes of CPR with 1 shock given from the [facility] AED.  Upon EMS arrival patient was with pulse, but she was exhibiting speech disturbance and she was having some jerking behavior.  Patient currently denies chest pain or shortness of breath, no abdominal pain, no recent illness, does not recall the event, endorsing nausea.  Review of Systems  Positive for cardiac arrest, nausea, syncope.  Patient's Health History    Past Medical History:  Diagnosis Date  . Arthritis of left hip       Family History  Problem Relation Age of Onset  . Stroke Mother   . Stroke Father     Social History   Socioeconomic History  . Marital status: Married    Spouse name: Not on file  . Number of children: Not on file  . Years of education: Not on file  . Highest education level: Not on file  Occupational History  . Not on file  Tobacco Use  . Smoking status: Never Smoker  . Smokeless tobacco: Never Used  Substance and Sexual Activity  . Alcohol use: Not Currently  . Drug use: Never  . Sexual activity: Not on file  Other Topics Concern  . Not on file  Social History Narrative  . Not on file   Social Determinants of Health   Financial Resource Strain:   . Difficulty of Paying Living Expenses:   Food Insecurity:   . Worried About Charity fundraiser in the Last Year:   . Arboriculturist in the Last Year:   Transportation Needs:   . Film/video editor (Medical):   Marland Kitchen Lack of Transportation (Non-Medical):   Physical Activity:   . Days of Exercise per Week:   . Minutes of Exercise per Session:   Stress:   .  Feeling of Stress :   Social Connections:   . Frequency of Communication with Friends and Family:   . Frequency of Social Gatherings with Friends and Family:   . Attends Religious Services:   . Active Member of Clubs or Organizations:   . Attends Archivist Meetings:   Marland Kitchen Marital Status:   Intimate Partner Violence:   . Fear of Current or Ex-Partner:   . Emotionally Abused:   Marland Kitchen Physically Abused:   . Sexually Abused:      Physical Exam   Vitals:   03/19/20 1620 03/19/20 1622  BP: 104/64   Pulse: 82   Resp: 17   Temp:  98.5 F (36.9 C)  SpO2: 97%     CONSTITUTIONAL: Well-appearing, NAD NEURO:  Alert and oriented x 3, normal and symmetric strength and sensation, normal coordination, mildly slowed response to questions but no aphasia, no dysarthria EYES:  eyes equal and reactive ENT/NECK:  no LAD, no JVD CARDIO: Regular rate, well-perfused, normal S1 and S2 PULM:  CTAB no wheezing or rhonchi GI/GU:  normal bowel sounds, non-distended, non-tender MSK/SPINE:  No gross deformities, no edema SKIN:  no rash, atraumatic PSYCH:  Appropriate speech and behavior  *Additional and/or pertinent findings included in MDM below  Diagnostic and Interventional  Summary    EKG Interpretation  Date/Time:  Tuesday March 19 2020 10:00:57 EDT Ventricular Rate:  101 PR Interval:    QRS Duration: 103 QT Interval:  362 QTC Calculation: 470 R Axis:   95 Text Interpretation: Sinus tachycardia Right axis deviation Confirmed by Gerlene Fee [phone]) on 03/19/2020 10:23:28 AM      Labs Reviewed  CBC - Abnormal; Notable for the following components:      Result Value   WBC 11.5 (*)    All other components within normal limits  DIFFERENTIAL - Abnormal; Notable for the following components:   Abs Immature Granulocytes 0.10 (*)    All other components within normal limits  COMPREHENSIVE METABOLIC PANEL - Abnormal; Notable for the following components:   Potassium 3.3 (*)    CO2 20 (*)     Glucose, Bld 214 (*)    Total Protein 6.4 (*)    AST 573 (*)    ALT 631 (*)    Anion gap 16 (*)    All other components within normal limits  D-DIMER, QUANTITATIVE (NOT AT [facility]) - Abnormal; Notable for the following components:   D-Dimer, Quant 10.36 (*)    All other components within normal limits  MAGNESIUM - Abnormal; Notable for the following components:   Magnesium 1.6 (*)    All other components within normal limits  I-STAT CHEM 8, ED - Abnormal; Notable for the following components:   Potassium 3.3 (*)    Glucose, Bld 209 (*)    Calcium, Ion 1.08 (*)    All other components within normal limits  TROPONIN I (HIGH SENSITIVITY) - Abnormal; Notable for the following components:   Troponin I (High Sensitivity) 88 (*)    All other components within normal limits  SARS CORONAVIRUS 2 BY RT PCR (HOSPITAL ORDER, [facility])  ETHANOL  PROTIME-INR  APTT  TSH  T4, FREE  RAPID URINE DRUG SCREEN, HOSP PERFORMED  URINALYSIS, ROUTINE W REFLEX MICROSCOPIC  HIV ANTIBODY (ROUTINE TESTING W REFLEX)  TSH  CK  HEMOGLOBIN A1C  TROPONIN I (HIGH SENSITIVITY)    MR BRAIN WO CONTRAST  Final Result    CT ANGIO HEAD CODE STROKE  Final Result    CT ANGIO NECK W OR WO CONTRAST  Final Result    DG Chest Port 1 View  Final Result    CT HEAD CODE STROKE WO CONTRAST  Final Result    CT ANGIO CHEST PE W OR WO CONTRAST    (Results Pending)    Medications  ondansetron (ZOFRAN) injection 4 mg (has no administration in time range)  acetaminophen (TYLENOL) tablet 650 mg (has no administration in time range)    Or  acetaminophen (TYLENOL) suppository 650 mg (has no administration in time range)  aspirin EC tablet 81 mg (has no administration in time range)  diphenhydrAMINE (BENADRYL) capsule 25 mg (has no administration in time range)  enoxaparin (LOVENOX) injection 60 mg (has no administration in time range)  ondansetron (ZOFRAN) 4 MG/2ML injection (4 mg  Given  03/19/20 1015)  promethazine (PHENERGAN) injection 12.5 mg (12.5 mg Intravenous Given 03/19/20 1036)  LORazepam (ATIVAN) injection 0.5 mg (0.5 mg Intravenous Given 03/19/20 1130)  sodium chloride 0.9 % bolus 1,000 mL (1,000 mLs Intravenous New Bag/Given 03/19/20 1131)  iohexol (OMNIPAQUE) 350 MG/ML injection 100 mL (75 mLs Intravenous Contrast Given 03/19/20 1122)  potassium chloride SA (KLOR-CON) CR tablet 40 mEq (40 mEq Oral Given 03/19/20 1412)     Procedures  /  Critical Care .Critical Care Performed by: Maudie Flakes, MD Authorized by: Maudie Flakes, MD   Critical care provider statement:    Critical care time (minutes):  45   Critical care was necessary to treat or prevent imminent or life-threatening deterioration of the following conditions: Cardiac arrest.   Critical care was time spent personally by me on the following activities:  Discussions with consultants, evaluation of patient's response to treatment, examination of patient, ordering and performing treatments and interventions, ordering and review of laboratory studies, ordering and review of radiographic studies, pulse oximetry, re-evaluation of patient's condition, obtaining history from patient or surrogate and review of old charts    ED Course and Medical Decision Making  I have reviewed the triage vital signs, the nursing notes, and pertinent available records from the EMR.  Listed above are laboratory and imaging tests that I personally ordered, reviewed, and interpreted and then considered in my medical decision making (see below for details).      Cardiac arrest with return of spontaneous circulation after single shock in the field.  Some concern for aphasia and clonic movements with EMS.  Code stroke initiated prior to arrival.  She seemed to have a good recovery on my initial exam, able to answer questions, alert and oriented, good strength and sensation, single episode emesis but nausea controlled with Zofran.  Given the  code stroke initiation she underwent CT imaging of the head and neck which is reassuring.  She continues to be hemodynamically stable and protecting her airway, I suspect that she had a cardiac arrest and had some abnormal neurological symptoms in route due to hypoxia, possibly a seizure.  Overall the primary etiology does not seem to be neurological.  Neurology recommending cardiology consultation.  We will continue close monitoring, will obtain cardiology consultation, anticipating admission to ICU or stepdown unit.  Post ROSC EKG does not reveal any significant ischemia.  Patient continued to be hemodynamically stable and neurologically at baseline during her few hours here in the emergency department.  Neurology will continue to follow, cardiology was consulted and will continue to follow.  Admitted to hospitalist stepdown unit for further care.  Barth Kirks. Sedonia Small, [place] [email or web address]  Final Clinical Impressions(s) / ED Diagnoses     ICD-10-CM   1. Cardiac arrest [facility])  I46.9     ED Discharge Orders    None       Discharge Instructions Discussed with and Provided to Patient:   Discharge Instructions   None       Maudie Flakes, MD 03/19/20 [phone]

## 2020-03-19 NOTE — ED Notes (Signed)
Pt husband is in Consult B.

## 2020-03-19 NOTE — H&P (Signed)
History and Physical    Allison Cannon [id number] DOB: [birth year] DOA: 03/19/2020  PCP: Patient, No Pcp Per (Confirm with patient/family/NH records and if not entered, this has to be entered at [facility] point of entry) Patient coming from: Home  I have personally briefly reviewed patient's old medical records in [place]  Chief Complaint: My heart stopped  HPI: Allison Cannon is a 64 y.o. female with no significant past medical history but chronic left leg pain secondary to puriform muscle compression, presented with cardiopulmonary arrest and CPR.  Patient has been healthy, with occasionally intake of ibuprofen for leg pain.  She she has been doing outpatient PT and workout class at the [facility] for her leg pain 3 times a week.  At 853 this morning, just before regular yoga class started, she suddenly collapsed without any prodromes. Downtime was minimal. CPR started, and at one point AED was showed shockable rhythm.  AED delivered 1 shock and there was no further shocks recommended.    CPR continued for about 15 minutes before surgery resume simultaneous circulation.  On arrival EMS noted patient had normal rhythm.  On arrival he noted left gaze deviation and slurred speech. Per EMS while in route she did have her head turned to the left, bilateral upper and lower body jerking with legs bent up to stomach along with bilateral flailing of arms.  She is diaphoretic and she was incontinent. ED Course: Vital signs were stable, patient complains some soreness in front of her chest, MRI negative for stroke, EEG negative for seizure disorder.  EKG normal sinus rhythm no significant ST-T changes with PR or QTC changes electrolyte potassium 3.3 magnesium 1.6, AST 570 ALT 630  Review of Systems: As per HPI otherwise 10 point review of systems negative.    Past Medical History:  Diagnosis Date  . Arthritis of left hip     Past Surgical History:  Procedure Laterality Date  . SP CHOLECYSTOMY       reports that she has never smoked. She has never used smokeless tobacco. She reports previous alcohol use. She reports that she does not use drugs.  Not on File  Family History  Problem Relation Age of Onset  . Stroke Mother   . Stroke Father      Prior to Admission medications   Medication Sig Start Date End Date Taking? Authorizing Provider  diphenhydrAMINE (BENADRYL) 25 MG tablet Take 25 mg by mouth every 6 (six) hours as needed for allergies (cough).   Yes [provider]  diphenhydramine-acetaminophen (TYLENOL PM) 25-500 MG TABS tablet Take 1 tablet by mouth at bedtime as needed (pain).   Yes [provider]  ibuprofen (ADVIL) 200 MG tablet Take 200 mg by mouth every 6 (six) hours as needed for moderate pain.   Yes [provider]  naproxen sodium (ALEVE) 220 MG tablet Take 220 mg by mouth daily as needed (pain).   Yes [provider]    Physical Exam: Vitals:   03/19/20 1445 03/19/20 1500 03/19/20 1515 03/19/20 1530  BP: (!) 96/59 99/66 105/67 102/69  Pulse: 82 85 90 83  Resp: 13 19 17 15   Temp:      TempSrc:      SpO2: 98% 97% 97% 96%  Weight:      Height:        Constitutional: NAD, calm, comfortable Vitals:   03/19/20 1445 03/19/20 1500 03/19/20 1515 03/19/20 1530  BP: (!) 96/59 99/66 105/67 102/69  Pulse: 82 85  90 83  Resp: 13 19 17 15   Temp:      TempSrc:      SpO2: 98% 97% 97% 96%  Weight:      Height:       Eyes: PERRL, lids and conjunctivae normal ENMT: Mucous membranes are moist. Posterior pharynx clear of any exudate or lesions.Normal dentition.  Neck: normal, supple, no masses, no thyromegaly Respiratory: clear to auscultation bilaterally, no wheezing, no crackles. Normal respiratory effort. No accessory muscle use.  Cardiovascular: Regular rate and rhythm, no murmurs / rubs / gallops. No extremity edema. 2+ pedal pulses. No carotid bruits.  Abdomen: no tenderness, no masses palpated. No hepatosplenomegaly. Bowel  sounds positive.  Musculoskeletal: no clubbing / cyanosis. No joint deformity upper and lower extremities. Good ROM, no contractures. Normal muscle tone.  Skin: no rashes, lesions, ulcers. No induration Neurologic: CN 2-12 grossly intact. Sensation intact, DTR normal. Strength 5/5 in all 4.  Psychiatric: Normal judgment and insight. Alert and oriented x 3. Normal mood.    Labs on Admission: I have personally reviewed following labs and imaging studies  CBC: Recent Labs  Lab 03/19/20 1008 03/19/20 1015  WBC 11.5*  --   NEUTROABS 7.5  --   HGB 14.0 13.6  HCT 41.4 40.0  MCV 87.9  --   PLT 256  --    Basic Metabolic Panel: Recent Labs  Lab 03/19/20 1008 03/19/20 1015  NA 139 139  K 3.3* 3.3*  CL 103 103  CO2 20*  --   GLUCOSE 214* 209*  BUN 10 10  CREATININE 0.86 0.60  CALCIUM 9.4  --    GFR: Estimated Creatinine Clearance: 68.6 mL/min (by C-G formula based on SCr of 0.6 mg/dL). Liver Function Tests: Recent Labs  Lab 03/19/20 1008  AST 573*  ALT 631*  ALKPHOS 83  BILITOT 0.7  PROT 6.4*  ALBUMIN 3.9   No results for input(s): LIPASE, AMYLASE in the last 168 hours. No results for input(s): AMMONIA in the last 168 hours. Coagulation Profile: Recent Labs  Lab 03/19/20 1008  INR 1.0   Cardiac Enzymes: No results for input(s): CKTOTAL, CKMB, CKMBINDEX, TROPONINI in the last 168 hours. BNP (last 3 results) No results for input(s): PROBNP in the last 8760 hours. HbA1C: No results for input(s): HGBA1C in the last 72 hours. CBG: No results for input(s): GLUCAP in the last 168 hours. Lipid Profile: No results for input(s): CHOL, HDL, LDLCALC, TRIG, CHOLHDL, LDLDIRECT in the last 72 hours. Thyroid Function Tests: Recent Labs    03/19/20 1424  TSH 1.195   Anemia Panel: No results for input(s): VITAMINB12, FOLATE, FERRITIN, TIBC, IRON, RETICCTPCT in the last 72 hours. Urine analysis: No results found for: COLORURINE, APPEARANCEUR, LABSPEC, PHURINE, GLUCOSEU,  HGBUR, BILIRUBINUR, KETONESUR, PROTEINUR, UROBILINOGEN, NITRITE, LEUKOCYTESUR  Radiological Exams on Admission: CT ANGIO NECK W OR WO CONTRAST  Result Date: 03/19/2020 CLINICAL DATA:  64 year old female code stroke presentation. EXAM: CT ANGIOGRAPHY HEAD AND NECK TECHNIQUE: Multidetector CT imaging of the head and neck was performed using the standard protocol during bolus administration of intravenous contrast. Multiplanar CT image reconstructions and MIPs were obtained to evaluate the vascular anatomy. Carotid stenosis measurements (when applicable) are obtained utilizing NASCET criteria, using the distal internal carotid diameter as the denominator. CONTRAST:  84mL OMNIPAQUE IOHEXOL 350 MG/ML SOLN COMPARISON:  Plain head CT  1014 hours today. FINDINGS: CTA NECK Skeleton: Chronic C5-C6 disc and endplate degeneration in the cervical spine. No acute osseous abnormality identified. Upper chest: Negative.  Other neck: Negative. Aortic arch: 3 vessel arch configuration. No arch atherosclerosis identified. Right carotid system: Negative. Left carotid system: Negative. Vertebral arteries: Proximal right subclavian artery and right vertebral artery origin are normal. The right vertebral is mildly tortuous and patent to the skull base without stenosis. No proximal left subclavian artery or left vertebral artery origin plaque or stenosis. Mildly dominant left vertebral is tortuous and patent to the skull base without stenosis. CTA HEAD A multiphase intracranial CTA was performed although accidentally, the vertex was excluded. Posterior circulation: Dominant left V4 segment with mild calcified plaque, only mild left V4 segment stenosis. Minimal right V4 plaque without stenosis. Normal right PICA origin. Patent vertebrobasilar junction is tortuous without stenosis. Patent basilar artery without stenosis. Normal SCA and PCA origins. Posterior communicating arteries are diminutive or absent. Bilateral PCA branches are within  normal limits. Anterior circulation: Both ICA siphons are patent. Minimal siphon plaque and no stenosis. Patent carotid termini. Normal MCA and ACA origins. Anterior communicating artery and visible ACA branches are within normal limits. Left MCA M1 segment and bifurcation are patent without stenosis. Left MCA branches are within normal limits. Right MCA M1 segment and trifurcation are patent. There is no right M2 branch occlusion. And no convincing right MCA branch occlusion. Multiphase: No additional findings. Venous sinuses: Superior sagittal sinus not included. Otherwise patent. Anatomic variants: Dominant left vertebral artery. Review of the MIP images confirms the above findings IMPRESSION: 1. Negative for large vessel occlusion. 2. Mild for age atherosclerosis, most pronounced in the dominant left vertebral V4 segment. No significant arterial stenosis in the head or neck. Electronically Signed   By: Genevie Ann M.D.   On: 03/19/2020 11:50   MR BRAIN WO CONTRAST  Result Date: 03/19/2020 CLINICAL DATA:  Stroke, follow-up. EXAM: MRI HEAD WITHOUT CONTRAST TECHNIQUE: Multiplanar, multiecho pulse sequences of the brain and surrounding structures were obtained without intravenous contrast. COMPARISON:  Noncontrast head CT and CT angiogram head/neck performed earlier the same day 03/19/2020. 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 Elsewhere, there is no focal parenchymal signal abnormality within the brain. There is no acute infarct. No chronic intracranial blood products. No extra-axial fluid collection. No midline shift. Vascular: Expected proximal arterial flow voids. Skull and upper cervical spine: No focal marrow lesion. Sinuses/Orbits: Visualized orbits show no acute finding. Minimal ethmoid sinus mucosal thickening. No significant mastoid effusion. IMPRESSION: No  evidence of acute infarct. 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 Primary differential considerations include a vestibular schwannoma or meningioma, although this finding is incompletely assessed on this noncontrast study. Contrast-enhanced brain MRI with internal auditory canal protocol is recommended for further characterization. Otherwise unremarkable MRI appearance of the brain. Mild ethmoid sinus mucosal thickening. Electronically Signed   By: Kellie Simmering DO   On: 03/19/2020 14:08   DG Chest Port 1 View  Result Date: 03/19/2020 CLINICAL DATA:  Cardiac arrest with CPR EXAM: PORTABLE CHEST 1 VIEW COMPARISON:  None. FINDINGS: Normal heart size and mediastinal contours. No acute infiltrate or edema. Minimal retrocardiac scarring. No effusion or pneumothorax. No acute osseous findings. Artifact from EKG leads. IMPRESSION: Negative portable chest. Electronically Signed   By: Monte Fantasia M.D.   On: 03/19/2020 10:46   CT HEAD CODE STROKE WO CONTRAST  Result Date: 03/19/2020 CLINICAL DATA:  Code stroke.  64 year old female with ataxia. EXAM: CT HEAD WITHOUT CONTRAST TECHNIQUE: Contiguous axial  images were obtained from the base of the skull through the vertex without intravenous contrast. COMPARISON:  None. FINDINGS: Brain: Cerebral volume is within normal limits for age. No midline shift, mass effect, or evidence of intracranial mass lesion. No ventriculomegaly. No acute intracranial hemorrhage identified. Gray-white matter differentiation is within normal limits for age. No acute cortically based infarct or chronic cortical encephalomalacia identified. Posterior fossa gray-white matter differentiation within normal limits. Vascular: Dominant left vertebral artery. Mild Calcified atherosclerosis at the skull base. No suspicious intracranial vascular hyperdensity. Skull: No acute osseous abnormality identified. Sinuses/Orbits: Visualized paranasal sinuses and mastoids are clear. Other: No acute orbit or  scalp soft tissue finding. ASPECTS [facility] Stroke Program Early CT Score) Total score (0-10 with 10 being normal): 10 IMPRESSION: 1. Normal for age non contrast CT appearance of the brain. ASPECTS 10. 2. These results were communicated to Dr. Cheral Marker at 10:21 am on 03/19/2020 by text page via the [facility] messaging system. Electronically Signed   By: Genevie Ann M.D.   On: 03/19/2020 10:21   CT ANGIO HEAD CODE STROKE  Result Date: 03/19/2020 CLINICAL DATA:  64 year old female code stroke presentation. EXAM: CT ANGIOGRAPHY HEAD AND NECK TECHNIQUE: Multidetector CT imaging of the head and neck was performed using the standard protocol during bolus administration of intravenous contrast. Multiplanar CT image reconstructions and MIPs were obtained to evaluate the vascular anatomy. Carotid stenosis measurements (when applicable) are obtained utilizing NASCET criteria, using the distal internal carotid diameter as the denominator. CONTRAST:  42mL OMNIPAQUE IOHEXOL 350 MG/ML SOLN COMPARISON:  Plain head CT  1014 hours today. FINDINGS: CTA NECK Skeleton: Chronic C5-C6 disc and endplate degeneration in the cervical spine. No acute osseous abnormality identified. Upper chest: Negative. Other neck: Negative. Aortic arch: 3 vessel arch configuration. No arch atherosclerosis identified. Right carotid system: Negative. Left carotid system: Negative. Vertebral arteries: Proximal right subclavian artery and right vertebral artery origin are normal. The right vertebral is mildly tortuous and patent to the skull base without stenosis. No proximal left subclavian artery or left vertebral artery origin plaque or stenosis. Mildly dominant left vertebral is tortuous and patent to the skull base without stenosis. CTA HEAD A multiphase intracranial CTA was performed although accidentally, the vertex was excluded. Posterior circulation: Dominant left V4 segment with mild calcified plaque, only mild left V4 segment stenosis. Minimal right V4  plaque without stenosis. Normal right PICA origin. Patent vertebrobasilar junction is tortuous without stenosis. Patent basilar artery without stenosis. Normal SCA and PCA origins. Posterior communicating arteries are diminutive or absent. Bilateral PCA branches are within normal limits. Anterior circulation: Both ICA siphons are patent. Minimal siphon plaque and no stenosis. Patent carotid termini. Normal MCA and ACA origins. Anterior communicating artery and visible ACA branches are within normal limits. Left MCA M1 segment and bifurcation are patent without stenosis. Left MCA branches are within normal limits. Right MCA M1 segment and trifurcation are patent. There is no right M2 branch occlusion. And no convincing right MCA branch occlusion. Multiphase: No additional findings. Venous sinuses: Superior sagittal sinus not included. Otherwise patent. Anatomic variants: Dominant left vertebral artery. Review of the MIP images confirms the above findings IMPRESSION: 1. Negative for large vessel occlusion. 2. Mild for age atherosclerosis, most pronounced in the dominant left vertebral V4 segment. No significant arterial stenosis in the head or neck. Electronically Signed   By: Genevie Ann M.D.   On: 03/19/2020 11:50    EKG: Independently reviewed.  No significant PR or QTC changes  Assessment/Plan  Active Problems:   Cardiac arrest [facility])   Cardiac arrest status post CPR with cardiovert -Suspect ventricular arrhythmia, replenish potassium to level of 4, and magnesium 2. -Telemetry monitoring -Check echocardiogram to rule out any structural heart problems such as HCOM -May need outpatient cardiac monitoring versus EP study, defer to cardiology.  Elevated D-dimer -Will order CT angiogram to rule out PE, however patient received IV contrast today, will postpone the CT angiogram to tomorrow 11 o'clock -For now, Lovenox 1 mg/kg twice daily until PE ruled out.  Hypomagnesemia -Replace and  recheck  Transaminitis -Suspect 2nd to transient cardiogenic shock from cardiac arrest and shock liver, will trend AST and ALT levels. -RUQ U/S  Elevated glucose -Recheck BMP in the morning, suspect dyspnea related to the glucose/IVF given during resuscitation.  DVT prophylaxis: Lovenox twice daily Code Status: Full code Family Communication: Husband at bedside Disposition Plan: Suspect patient may have a fatal arrhythmia causing cardiopulmonary arrest, likely will need extended inpatient monitoring and cardiac work-up, likely will stay 2 midnights Consults called: Neurology cardiology Admission status: PCU   Lequita Halt MD [facility] Pager [phone]    03/19/2020, 3:34 PM

## 2020-03-19 NOTE — Plan of Care (Signed)
  Problem: Education: Goal: Knowledge of General Education information will improve Description: Including pain rating scale, medication(s)/side effects and non-pharmacologic comfort measures Outcome: Progressing   Problem: Clinical Measurements: Goal: Ability to maintain clinical measurements within normal limits will improve Outcome: Progressing Goal: Diagnostic test results will improve Outcome: Progressing   Problem: Activity: Goal: Risk for activity intolerance will decrease Outcome: Progressing   Problem: Nutrition: Goal: Adequate nutrition will be maintained Outcome: Adequate for Discharge   Problem: Pain Managment: Goal: General experience of comfort will improve Outcome: Adequate for Discharge

## 2020-03-20 ENCOUNTER — Inpatient Hospital Stay (HOSPITAL_COMMUNITY): Payer: BC Managed Care – PPO

## 2020-03-20 DIAGNOSIS — R55 Syncope and collapse: Secondary | ICD-10-CM

## 2020-03-20 LAB — CBC
HCT: 40 % (ref 36.0–46.0)
Hemoglobin: 13 g/dL (ref 12.0–15.0)
MCH: 29.1 pg (ref 26.0–34.0)
MCHC: 32.5 g/dL (ref 30.0–36.0)
MCV: 89.7 fL (ref 80.0–100.0)
Platelets: 195 10*3/uL (ref 150–400)
RBC: 4.46 MIL/uL (ref 3.87–5.11)
RDW: 12.6 % (ref 11.5–15.5)
WBC: 5.5 10*3/uL (ref 4.0–10.5)
nRBC: 0 % (ref 0.0–0.2)

## 2020-03-20 LAB — TROPONIN I (HIGH SENSITIVITY): Troponin I (High Sensitivity): 23 ng/L — ABNORMAL HIGH (ref <18)

## 2020-03-20 LAB — COMPREHENSIVE METABOLIC PANEL
ALT: 433 U/L — ABNORMAL HIGH (ref 0–44)
AST: 258 U/L — ABNORMAL HIGH (ref 15–41)
Albumin: 3.6 g/dL (ref 3.5–5.0)
Alkaline Phosphatase: 82 U/L (ref 38–126)
Anion gap: 7 (ref 5–15)
BUN: 7 mg/dL — ABNORMAL LOW (ref 8–23)
CO2: 27 mmol/L (ref 22–32)
Calcium: 9.1 mg/dL (ref 8.9–10.3)
Chloride: 106 mmol/L (ref 98–111)
Creatinine, Ser: 0.72 mg/dL (ref 0.44–1.00)
GFR calc Af Amer: >60 mL/min (ref >60)
GFR calc non Af Amer: >60 mL/min (ref >60)
Glucose, Bld: 96 mg/dL (ref 70–99)
Potassium: 3.9 mmol/L (ref 3.5–5.1)
Sodium: 140 mmol/L (ref 135–145)
Total Bilirubin: 0.8 mg/dL (ref 0.3–1.2)
Total Protein: 5.8 g/dL — ABNORMAL LOW (ref 6.5–8.1)

## 2020-03-20 LAB — HIV ANTIBODY (ROUTINE TESTING W REFLEX): HIV Screen 4th Generation wRfx: NONREACTIVE

## 2020-03-20 LAB — MAGNESIUM: Magnesium: 1.9 mg/dL (ref 1.7–2.4)

## 2020-03-20 MED ORDER — MAGNESIUM SULFATE 2 GM/50ML IV SOLN
2.0000 g | Freq: Once | INTRAVENOUS | Status: AC
  Administered 2020-03-20: 2 g via INTRAVENOUS
  Filled 2020-03-20: qty 50

## 2020-03-20 MED ORDER — SODIUM CHLORIDE 0.9% FLUSH
3.0000 mL | Freq: Two times a day (BID) | INTRAVENOUS | Status: DC
  Administered 2020-03-20: 3 mL via INTRAVENOUS

## 2020-03-20 MED ORDER — ENOXAPARIN SODIUM 60 MG/0.6ML ~~LOC~~ SOLN: 1.0000 mg/kg | Freq: Two times a day (BID) | SUBCUTANEOUS | Status: DC

## 2020-03-20 MED ORDER — IOHEXOL 350 MG/ML SOLN
65.0000 mL | Freq: Once | INTRAVENOUS | Status: AC | PRN
  Administered 2020-03-20: 65 mL via INTRAVENOUS

## 2020-03-20 MED ORDER — SODIUM CHLORIDE 0.9 % WEIGHT BASED INFUSION
3.0000 mL/kg/h | INTRAVENOUS | Status: DC
  Administered 2020-03-21: 3 mL/kg/h via INTRAVENOUS

## 2020-03-20 MED ORDER — SODIUM CHLORIDE 0.9 % WEIGHT BASED INFUSION
1.0000 mL/kg/h | INTRAVENOUS | Status: DC
  Administered 2020-03-21: 1 mL/kg/h via INTRAVENOUS

## 2020-03-20 MED ORDER — SODIUM CHLORIDE 0.9 % IV SOLN: INTRAVENOUS | Status: DC

## 2020-03-20 MED ORDER — SODIUM CHLORIDE 0.9% FLUSH: 3.0000 mL | INTRAVENOUS | Status: DC | PRN

## 2020-03-20 MED ORDER — SODIUM CHLORIDE 0.9 % IV SOLN: 250.0000 mL | INTRAVENOUS | Status: DC | PRN

## 2020-03-20 MED ORDER — ENOXAPARIN SODIUM 40 MG/0.4ML ~~LOC~~ SOLN
40.0000 mg | SUBCUTANEOUS | Status: DC
  Administered 2020-03-21 – 2020-03-22 (×2): 40 mg via SUBCUTANEOUS
  Filled 2020-03-20 (×2): qty 0.4

## 2020-03-20 MED ORDER — POTASSIUM CHLORIDE CRYS ER 20 MEQ PO TBCR
20.0000 meq | EXTENDED_RELEASE_TABLET | Freq: Once | ORAL | Status: AC
  Administered 2020-03-20: 20 meq via ORAL
  Filled 2020-03-20: qty 1

## 2020-03-20 MED ORDER — ASPIRIN 81 MG PO CHEW
81.0000 mg | CHEWABLE_TABLET | ORAL | Status: AC
  Administered 2020-03-21: 81 mg via ORAL
  Filled 2020-03-20: qty 1

## 2020-03-20 NOTE — Progress Notes (Addendum)
Progress Note  [name] Date of Encounter: 03/20/2020  Primary Cardiologist: [name] (new)  Subjective   No chest pain. No dyspnea. No overnight events.   Inpatient Medications    Scheduled Meds: . aspirin EC  81 mg Oral Daily  . enoxaparin (LOVENOX) injection  1 mg/kg Subcutaneous Q12H  . ondansetron (ZOFRAN) IV  4 mg Intravenous Once   Continuous Infusions:  PRN Meds: acetaminophen **OR** acetaminophen, diphenhydrAMINE   Vital Signs    Vitals:   03/19/20 2010 03/20/20 0037 03/20/20 0342 03/20/20 0345  BP:  110/76  118/78  Pulse:  83  77  Resp:  16  14  Temp:  98.3 F (36.8 C)  99 F (37.2 C)  TempSrc:  Oral  Oral  SpO2: 97% 99%  97%  Weight:   61.4 kg   Height:        Intake/Output Summary (Last 24 hours) at 03/20/2020 0915 Last data filed at 03/19/2020 2012 Gross per 24 hour  Intake 240 ml  Output --  Net 240 ml   Last 3 Weights 03/20/2020 03/19/2020 03/19/2020  Weight (lbs) 135 lb 4.8 oz 135 lb 12.8 oz 135 lb  Weight (kg) 61.372 kg 61.598 kg 61.236 kg      Telemetry    Sinus - Personally Reviewed  ECG    No AM EKG - Personally Reviewed  Physical Exam   GEN: No acute distress.   Neck: No JVD Cardiac: RRR, no murmurs, rubs, or gallops.  Respiratory: Clear to auscultation bilaterally. GI: Soft, nontender, non-distended  MS: No edema; No deformity. Neuro:  Nonfocal  Psych: Normal affect   Labs    High Sensitivity Troponin:   Recent Labs  Lab 03/19/20 1008 03/19/20 1424 03/20/20 0647  TROPONINIHS 8 88* 23*      Chemistry Recent Labs  Lab 03/19/20 1008 03/19/20 1015 03/20/20 0647  NA 139 139 140  K 3.3* 3.3* 3.9  CL 103 103 106  CO2 20*  --  27  GLUCOSE 214* 209* 96  BUN 10 10 7*  CREATININE 0.86 0.60 0.72  CALCIUM 9.4  --  9.1  PROT 6.4*  --  5.8*  ALBUMIN 3.9  --  3.6  AST 573*  --  258*  ALT 631*  --  433*  ALKPHOS 83  --  82  BILITOT 0.7  --  0.8  GFRNONAA >60  --  >60  GFRAA >60  --  >60  ANIONGAP 16*   --  7     Hematology Recent Labs  Lab 03/19/20 1008 03/19/20 1015 03/20/20 0647  WBC 11.5*  --  5.5  RBC 4.71  --  4.46  HGB 14.0 13.6 13.0  HCT 41.4 40.0 40.0  MCV 87.9  --  89.7  MCH 29.7  --  29.1  MCHC 33.8  --  32.5  RDW 12.4  --  12.6  PLT 256  --  195    BNPNo results for input(s): BNP, PROBNP in the last 168 hours.   DDimer  Recent Labs  Lab 03/19/20 1424  DDIMER 10.36*     Radiology    EEG  Result Date: 03/19/2020 Lora Havens, MD     03/19/2020  4:07 PM [name] MRN: [id number] Epilepsy Attending: Lora Havens Referring Physician/Provider: Etta Quill, PA Date: 03/19/2020 Duration: 24.15 mins Patient history:  64 year old female with no past medical history presenting to the emergency department after a syncopal episode followed by seizure-like jerking  that lasted briefly. EEG to evaluate for seizure Level of alertness: Awake, asleep AEDs during EEG study: None Technical aspects: This EEG study was done with scalp electrodes positioned according to the 10-20 International system of electrode placement. Electrical activity was acquired at a sampling rate of 500Hz  and reviewed with a high frequency filter of 70Hz  and a low frequency filter of 1Hz . EEG data were recorded continuously and digitally stored. Description: The posterior dominant rhythm consists of 8.5-9 Hz activity of moderate voltage (25-35 uV) seen predominantly in posterior head regions, symmetric and reactive to eye opening and eye closing. Sleep was characterized by vertex waves, maximal frontocentral region.   Hyperventilation and photic stimulation were not performed.   IMPRESSION: This study is within normal limits. No seizures or epileptiform discharges were seen throughout the recording. Priyanka Barbra Sarks   CT ANGIO NECK W OR WO CONTRAST  Result Date: 03/19/2020 CLINICAL DATA:  64 year old female code stroke presentation. EXAM: CT ANGIOGRAPHY HEAD AND NECK TECHNIQUE: Multidetector  CT imaging of the head and neck was performed using the standard protocol during bolus administration of intravenous contrast. Multiplanar CT image reconstructions and MIPs were obtained to evaluate the vascular anatomy. Carotid stenosis measurements (when applicable) are obtained utilizing NASCET criteria, using the distal internal carotid diameter as the denominator. CONTRAST:  47mL OMNIPAQUE IOHEXOL 350 MG/ML SOLN COMPARISON:  Plain head CT  1014 hours today. FINDINGS: CTA NECK Skeleton: Chronic C5-C6 disc and endplate degeneration in the cervical spine. No acute osseous abnormality identified. Upper chest: Negative. Other neck: Negative. Aortic arch: 3 vessel arch configuration. No arch atherosclerosis identified. Right carotid system: Negative. Left carotid system: Negative. Vertebral arteries: Proximal right subclavian artery and right vertebral artery origin are normal. The right vertebral is mildly tortuous and patent to the skull base without stenosis. No proximal left subclavian artery or left vertebral artery origin plaque or stenosis. Mildly dominant left vertebral is tortuous and patent to the skull base without stenosis. CTA HEAD A multiphase intracranial CTA was performed although accidentally, the vertex was excluded. Posterior circulation: Dominant left V4 segment with mild calcified plaque, only mild left V4 segment stenosis. Minimal right V4 plaque without stenosis. Normal right PICA origin. Patent vertebrobasilar junction is tortuous without stenosis. Patent basilar artery without stenosis. Normal SCA and PCA origins. Posterior communicating arteries are diminutive or absent. Bilateral PCA branches are within normal limits. Anterior circulation: Both ICA siphons are patent. Minimal siphon plaque and no stenosis. Patent carotid termini. Normal MCA and ACA origins. Anterior communicating artery and visible ACA branches are within normal limits. Left MCA M1 segment and bifurcation are patent without  stenosis. Left MCA branches are within normal limits. Right MCA M1 segment and trifurcation are patent. There is no right M2 branch occlusion. And no convincing right MCA branch occlusion. Multiphase: No additional findings. Venous sinuses: Superior sagittal sinus not included. Otherwise patent. Anatomic variants: Dominant left vertebral artery. Review of the MIP images confirms the above findings IMPRESSION: 1. Negative for large vessel occlusion. 2. Mild for age atherosclerosis, most pronounced in the dominant left vertebral V4 segment. No significant arterial stenosis in the head or neck. Electronically Signed   By: Genevie Ann M.D.   On: 03/19/2020 11:50   MR BRAIN WO CONTRAST  Result Date: 03/19/2020 CLINICAL DATA:  Stroke, follow-up. EXAM: MRI HEAD WITHOUT CONTRAST TECHNIQUE: Multiplanar, multiecho pulse sequences of the brain and surrounding structures were obtained without intravenous contrast. COMPARISON:  Noncontrast head CT and CT angiogram head/neck performed earlier the  same day 03/19/2020. 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 Elsewhere, there is no focal parenchymal signal abnormality within the brain. There is no acute infarct. No chronic intracranial blood products. No extra-axial fluid collection. No midline shift. Vascular: Expected proximal arterial flow voids. Skull and upper cervical spine: No focal marrow lesion. Sinuses/Orbits: Visualized orbits show no acute finding. Minimal ethmoid sinus mucosal thickening. No significant mastoid effusion. IMPRESSION: No evidence of acute infarct. 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 Primary differential considerations include a vestibular schwannoma or meningioma, although this finding is incompletely assessed on this noncontrast study. Contrast-enhanced brain MRI with internal auditory canal protocol is  recommended for further characterization. Otherwise unremarkable MRI appearance of the brain. Mild ethmoid sinus mucosal thickening. Electronically Signed   By: Kellie Simmering DO   On: 03/19/2020 14:08   DG Chest Port 1 View  Result Date: 03/19/2020 CLINICAL DATA:  Cardiac arrest with CPR EXAM: PORTABLE CHEST 1 VIEW COMPARISON:  None. FINDINGS: Normal heart size and mediastinal contours. No acute infiltrate or edema. Minimal retrocardiac scarring. No effusion or pneumothorax. No acute osseous findings. Artifact from EKG leads. IMPRESSION: Negative portable chest. Electronically Signed   By: Monte Fantasia M.D.   On: 03/19/2020 10:46   ECHOCARDIOGRAM COMPLETE  Result Date: 03/19/2020    ECHOCARDIOGRAM REPORT   Patient Name:   Allison Cannon Date of Exam: 03/19/2020 Medical Rec #:  [id number]    Height:       67.0 in Accession #:    [id number]   Weight:       135.0 lb Date of Birth:  [birth year]   BSA:          34.711 m Patient Age:    67 years     BP:           96/59 mmHg Patient Gender: F            HR:           95 bpm. Exam Location:  Inpatient Procedure: 2D Echo, Color Doppler and Cardiac Doppler Indications:    Cardiac Arrest  History:        Patient has no prior history of Echocardiogram examinations.  Sonographer:    Raquel Sarna Senior RDCS Referring Phys: [place]  1. Left ventricular ejection fraction, by estimation, is 70 to 75%. The left ventricle has hyperdynamic function. The left ventricle has no regional wall motion abnormalities. Left ventricular diastolic parameters were normal.  2. Right ventricular systolic function is normal. The right ventricular size is normal.  3. Left atrial size was mildly dilated.  4. Right atrial size was mildly dilated.  5. The mitral valve is normal in structure. Trivial mitral valve regurgitation. No evidence of mitral stenosis.  6. The aortic valve is tricuspid. Aortic valve regurgitation is not visualized. No aortic stenosis is present.  7. Aortic  dilatation noted. There is borderline dilatation of the ascending aorta measuring 37 mm.  8. The inferior vena cava is normal in size with <50% respiratory variability, suggesting right atrial pressure of 8 mmHg. Comparison(s): No prior Echocardiogram. Conclusion(s)/Recommendation(s): Normal biventricular function without evidence of hemodynamically significant valvular heart disease. FINDINGS  Left Ventricle: Left ventricular ejection fraction, by estimation, is 70 to 75%. The left ventricle has hyperdynamic function. The left ventricle has no regional wall motion abnormalities. The left ventricular internal cavity size was  normal in size. There is no left ventricular hypertrophy. Left ventricular diastolic parameters were normal. Right Ventricle: The right ventricular size is normal. No increase in right ventricular wall thickness. Right ventricular systolic function is normal. Left Atrium: Left atrial size was mildly dilated. Right Atrium: Right atrial size was mildly dilated. Pericardium: Trivial pericardial effusion is present. Mitral Valve: The mitral valve is normal in structure. Trivial mitral valve regurgitation. No evidence of mitral valve stenosis. Tricuspid Valve: The tricuspid valve is normal in structure. Tricuspid valve regurgitation is trivial. No evidence of tricuspid stenosis. Aortic Valve: The aortic valve is tricuspid. Aortic valve regurgitation is not visualized. No aortic stenosis is present. Pulmonic Valve: The pulmonic valve was grossly normal. Pulmonic valve regurgitation is not visualized. Aorta: Aortic dilatation noted. There is borderline dilatation of the ascending aorta measuring 37 mm. Venous: The inferior vena cava is normal in size with less than 50% respiratory variability, suggesting right atrial pressure of 8 mmHg. IAS/Shunts: No atrial level shunt detected by color flow Doppler. Additional Comments: There is a small pleural effusion in both left and right lateral regions.  LEFT  VENTRICLE PLAX 2D LVIDd:         4.60 cm  Diastology LVIDs:         2.50 cm  LV e' lateral:   14.40 cm/s LV PW:         0.90 cm  LV E/e' lateral: 4.2 LV IVS:        1.00 cm  LV e' medial:    9.79 cm/s LVOT diam:     1.90 cm  LV E/e' medial:  6.1 LV SV:         61 LV SV Index:   36 LVOT Area:     2.84 cm  RIGHT VENTRICLE RV S prime:     14.90 cm/s TAPSE (M-mode): 2.9 cm LEFT ATRIUM           Index       RIGHT ATRIUM           Index LA diam:      2.50 cm 1.46 cm/m  RA Area:     19.00 cm LA Vol (A2C): 52.3 ml 30.56 ml/m RA Volume:   51.70 ml  30.21 ml/m LA Vol (A4C): 46.4 ml 27.12 ml/m  AORTIC VALVE LVOT Vmax:   119.00 cm/s LVOT Vmean:  79.200 cm/s LVOT VTI:    0.215 m  AORTA Ao Root diam: 3.20 cm Ao Asc diam:  3.70 cm MITRAL VALVE MV Area (PHT): 2.43 cm    SHUNTS MV Decel Time: 312 msec    Systemic VTI:  0.22 m MV E velocity: 60.00 cm/s  Systemic Diam: 1.90 cm MV A velocity: 57.40 cm/s MV E/A ratio:  1.05 Buford Dresser MD Electronically signed by Buford Dresser MD Signature Date/Time: 03/19/2020/5:23:57 PM    Final    CT HEAD CODE STROKE WO CONTRAST  Result Date: 03/19/2020 CLINICAL DATA:  Code stroke.  64 year old female with ataxia. EXAM: CT HEAD WITHOUT CONTRAST TECHNIQUE: Contiguous axial images were obtained from the base of the skull through the vertex without intravenous contrast. COMPARISON:  None. FINDINGS: Brain: Cerebral volume is within normal limits for age. No midline shift, mass effect, or evidence of intracranial mass lesion. No ventriculomegaly. No acute intracranial hemorrhage identified. Gray-white matter differentiation is within normal limits for age. No acute cortically based infarct or chronic cortical encephalomalacia identified. Posterior fossa gray-white matter differentiation within normal limits. Vascular: Dominant left vertebral artery. Mild  Calcified atherosclerosis at the skull base. No suspicious intracranial vascular hyperdensity. Skull: No acute osseous  abnormality identified. Sinuses/Orbits: Visualized paranasal sinuses and mastoids are clear. Other: No acute orbit or scalp soft tissue finding. ASPECTS [facility] [place] [facility] Stroke Program Early CT Score) Total score (0-10 with 10 being normal): 10 IMPRESSION: 1. Normal for age non contrast CT appearance of the brain. ASPECTS 10. 2. These results were communicated to Dr. Cheral Marker at 10:21 am on 03/19/2020 by text page via the [facility] messaging system. Electronically Signed   By: Genevie Ann M.D.   On: 03/19/2020 10:21   CT ANGIO HEAD CODE STROKE  Result Date: 03/19/2020 CLINICAL DATA:  64 year old female code stroke presentation. EXAM: CT ANGIOGRAPHY HEAD AND NECK TECHNIQUE: Multidetector CT imaging of the head and neck was performed using the standard protocol during bolus administration of intravenous contrast. Multiplanar CT image reconstructions and MIPs were obtained to evaluate the vascular anatomy. Carotid stenosis measurements (when applicable) are obtained utilizing NASCET criteria, using the distal internal carotid diameter as the denominator. CONTRAST:  26mL OMNIPAQUE IOHEXOL 350 MG/ML SOLN COMPARISON:  Plain head CT  1014 hours today. FINDINGS: CTA NECK Skeleton: Chronic C5-C6 disc and endplate degeneration in the cervical spine. No acute osseous abnormality identified. Upper chest: Negative. Other neck: Negative. Aortic arch: 3 vessel arch configuration. No arch atherosclerosis identified. Right carotid system: Negative. Left carotid system: Negative. Vertebral arteries: Proximal right subclavian artery and right vertebral artery origin are normal. The right vertebral is mildly tortuous and patent to the skull base without stenosis. No proximal left subclavian artery or left vertebral artery origin plaque or stenosis. Mildly dominant left vertebral is tortuous and patent to the skull base without stenosis. CTA HEAD A multiphase intracranial CTA was performed although accidentally, the vertex was excluded. Posterior  circulation: Dominant left V4 segment with mild calcified plaque, only mild left V4 segment stenosis. Minimal right V4 plaque without stenosis. Normal right PICA origin. Patent vertebrobasilar junction is tortuous without stenosis. Patent basilar artery without stenosis. Normal SCA and PCA origins. Posterior communicating arteries are diminutive or absent. Bilateral PCA branches are within normal limits. Anterior circulation: Both ICA siphons are patent. Minimal siphon plaque and no stenosis. Patent carotid termini. Normal MCA and ACA origins. Anterior communicating artery and visible ACA branches are within normal limits. Left MCA M1 segment and bifurcation are patent without stenosis. Left MCA branches are within normal limits. Right MCA M1 segment and trifurcation are patent. There is no right M2 branch occlusion. And no convincing right MCA branch occlusion. Multiphase: No additional findings. Venous sinuses: Superior sagittal sinus not included. Otherwise patent. Anatomic variants: Dominant left vertebral artery. Review of the MIP images confirms the above findings IMPRESSION: 1. Negative for large vessel occlusion. 2. Mild for age atherosclerosis, most pronounced in the dominant left vertebral V4 segment. No significant arterial stenosis in the head or neck. Electronically Signed   By: Genevie Ann M.D.   On: 03/19/2020 11:50   US Abdomen Limited RUQ  Result Date: 03/19/2020 CLINICAL DATA:  Elevated LFTs EXAM: ULTRASOUND ABDOMEN LIMITED RIGHT UPPER QUADRANT COMPARISON:  None. FINDINGS: Gallbladder: Prior cholecystectomy Common bile duct: Diameter: Normal caliber, 5 mm Liver: No focal lesion identified. Within normal limits in parenchymal echogenicity. Portal vein is patent on color Doppler imaging with normal direction of blood flow towards the liver. Other: None. IMPRESSION: No acute findings. Electronically Signed   By: Rolm Baptise M.D.   On: 03/19/2020 23:24    Cardiac Studies  Patient Profile       64 y.o. female with no chronic medical issues admitted following a syncopal event/cardiac arrest on 03/19/20. She was exercising and lost consciousness. Witnessed arrest with immediate CPR and defibrillation by AED. No persistent neuro issues post arrest.   Assessment & Plan    1. Cardiac arrest: No evidence of ACS (Troponin peak 88 then down to 23 post arrest/CPR). EKG without ischemic changes. Echo with normal LV systolic function. Low suspicion for obstructive CAD. Her D-dimer was elevated and CTA chest is pending to exclude PE. She has had no recent long trips or risk factors for PE. She did receive the Covid vaccine which has been associated with hypercoagulability so I think a definitive CTA to exclude PE is indicated. If no evidence of PE, will plan cardiac cath to exclude CAD. If no CAD is noted, will involve EP to discuss the need for an ICD.   Addendum 1 pm: CTA chest negative for PE. Will plan cardiac cath tomorrow. She had lunch already today. NPO at midnight. Cath orders placed.  I have reviewed the risks, indications, and alternatives to cardiac catheterization, possible angioplasty, and stenting with the patient. Risks include but are not limited to bleeding, infection, vascular injury, stroke, myocardial infection, arrhythmia, kidney injury, radiation-related injury in the case of prolonged fluoroscopy use, emergency cardiac surgery, and death. The patient understands the risks of serious complication is 1-2 in [id number] with diagnostic cardiac cath and 1-2% or less with angioplasty/stenting.   For questions or updates, please contact [place] Please consult [email or web address] for contact info under        Signed, Lauree Chandler, MD  03/20/2020, 9:15 AM

## 2020-03-20 NOTE — Consult Note (Addendum)
Cardiology Consultation:   Patient ID: Allison Cannon MRN: [id number]; DOB: 06-03-1956  Admit date: 03/19/2020 Date of Consult: 03/20/2020  Primary Care Provider: Patient, No Pcp Per Primary Cardiologist: new to [facility], Dr. [name] Primary Electrophysiologist:  None    Patient Profile:   Allison Cannon is a 64 y.o. female with no notable PMHx who is being seen today for the evaluation of cardiac arrest at the request of Dr. [name].  History of Present Illness:   Allison Cannon was at the [facility] exercising when she was witnessed to collapse.  EMS record reviewed On their arrival FD was on scene, [t was conscious though with snoring type respirations Reportedly she had immediate CPR started and once AED applied was advised shock and she was given one shock  By AED, afterwards she became conscious though with body jerking, throwing her head back, incontinent of urine and spastic type movements.  She had Left gaze preference, displayed lip smacking, sticking tongue out. She was found in SR and maintained SR through their tracings, rates low 100's, and stable BP.  Code stroke was activated, neurology and cardiology consulted.  Neurology note from today is incomplete though appears they feel most likely the patient suffered from a syncopal episode secondary to an acute arrhythmia, followed by syncopal seizure. EEG is normal, which militates against epilepsy as the primary etiology for her presentation.   MRI brain is negative for stroke. 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  HS Trop 8 > 88 > 23 K+ 3.3 > 3.3 > 3.9 Mag 1.6 > 1.9 BUN/Creat 10/0.66 WBC 11.5 > 5.5 H/H 13/40 Plts 195 TSH 1.195 D dimer 10.36 >  CT chest that r/o PE  Echo noted LVEF 70-75%, hyperdynamic LV, no WMA  Home meds only for PRN tylenol, advil, aleve, benadryl  TOX: ETOH <10  She had no presyncopal symptoms/warning In the 10th grade they  visited a hospital, blood bank/lab and the smell/sight of blood provoked fainting. No family cardiac history, no sudden death hx Her sister has vaso-vagal syncope    Past Medical History:  Diagnosis Date   Arthritis of left hip     Past Surgical History:  Procedure Laterality Date   SP CHOLECYSTOMY       Home Medications:  Prior to Admission medications   Medication Sig Start Date End Date Taking? Authorizing Provider  diphenhydrAMINE (BENADRYL) 25 MG tablet Take 25 mg by mouth every 6 (six) hours as needed for allergies (cough).   Yes [provider]  diphenhydramine-acetaminophen (TYLENOL PM) 25-500 MG TABS tablet Take 1 tablet by mouth at bedtime as needed (pain).   Yes [provider]  ibuprofen (ADVIL) 200 MG tablet Take 200 mg by mouth every 6 (six) hours as needed for moderate pain.   Yes [provider]  naproxen sodium (ALEVE) 220 MG tablet Take 220 mg by mouth daily as needed (pain).   Yes [provider]    Inpatient Medications: Scheduled Meds:  [START ON 03/21/2020] aspirin  81 mg Oral Pre-Cath   aspirin EC  81 mg Oral Daily   enoxaparin (LOVENOX) injection  1 mg/kg Subcutaneous Q12H   ondansetron (ZOFRAN) IV  4 mg Intravenous Once   sodium chloride flush  3 mL Intravenous Q12H   Continuous Infusions:  sodium chloride     sodium chloride     [START ON 03/21/2020] sodium chloride     Followed by   [  START ON 03/21/2020] sodium chloride     PRN Meds: sodium chloride, acetaminophen **OR** acetaminophen, diphenhydrAMINE, sodium chloride flush  Allergies:   Not on File  Social History:   Social History   Socioeconomic History   Marital status: Married    Spouse name: Not on file   Number of children: Not on file   Years of education: Not on file   Highest education level: Not on file  Occupational History   Not on file  Tobacco Use   Smoking status: Never Smoker   Smokeless tobacco: Never Used  Substance and Sexual  Activity   Alcohol use: Not Currently   Drug use: Never   Sexual activity: Not on file  Other Topics Concern   Not on file  Social History Narrative   Not on file   Social Determinants of Health   Financial Resource Strain:    Difficulty of Paying Living Expenses:   Food Insecurity:    Worried About Charity fundraiser in the Last Year:    Arboriculturist in the Last Year:   Transportation Needs:    Film/video editor (Medical):    Lack of Transportation (Non-Medical):   Physical Activity:    Days of Exercise per Week:    Minutes of Exercise per Session:   Stress:    Feeling of Stress :   Social Connections:    Frequency of Communication with Friends and Family:    Frequency of Social Gatherings with Friends and Family:    Attends Religious Services:    Active Member of Clubs or Organizations:    Attends Music therapist:    Marital Status:   Intimate Partner Violence:    Fear of Current or Ex-Partner:    Emotionally Abused:    Physically Abused:    Sexually Abused:     Family History:   Family History  Problem Relation Age of Onset   Stroke Mother    Stroke Father      ROS:  Please see the history of present illness.  All other ROS reviewed and negative.     Physical Exam/Data:   Vitals:   03/20/20 0037 03/20/20 0342 03/20/20 0345 03/20/20 0949  BP: 110/76  118/78 112/73  Pulse: 83  77 80  Resp: 16  14 16   Temp: 98.3 F (36.8 C)  99 F (37.2 C) 98.3 F (36.8 C)  TempSrc: Oral  Oral Oral  SpO2: 99%  97% 97%  Weight:  61.4 kg    Height:        Intake/Output Summary (Last 24 hours) at 03/20/2020 1332 Last data filed at 03/19/2020 2012 Gross per 24 hour  Intake 240 ml  Output --  Net 240 ml   Last 3 Weights 03/20/2020 03/19/2020 03/19/2020  Weight (lbs) 135 lb 4.8 oz 135 lb 12.8 oz 135 lb  Weight (kg) 61.372 kg 61.598 kg 61.236 kg     Body mass index is 21.19 kg/m.  General:  Well nourished, well developed, in no acute distress HEENT:  normal Lymph: no adenopathy Neck: no JVD Endocrine:  No thryomegaly Vascular: No carotid bruits  Cardiac: RRR; no murmurs, gallops or rubs Lungs:  CTA b/l, no wheezing, rhonchi or rales  Abd: soft, nontender, no hepatomegaly  Ext: no edema Musculoskeletal:  No deformities, BUE and BLE strength normal and equal Skin: warm and dry  Neuro:  No gross focal abnormalities noted Psych:  Normal affect   EKG:  The EKG was  personally reviewed and demonstrates:   SR , normal intervals  Telemetry:  Telemetry was personally reviewed and demonstrates:   SR  Relevant CV Studies:  03/19/2020: TTE IMPRESSIONS  1. Left ventricular ejection fraction, by estimation, is 70 to 75%. The  left ventricle has hyperdynamic function. The left ventricle has no  regional wall motion abnormalities. Left ventricular diastolic parameters  were normal.   2. Right ventricular systolic function is normal. The right ventricular  size is normal.   3. Left atrial size was mildly dilated.   4. Right atrial size was mildly dilated.   5. The mitral valve is normal in structure. Trivial mitral valve  regurgitation. No evidence of mitral stenosis.   6. The aortic valve is tricuspid. Aortic valve regurgitation is not  visualized. No aortic stenosis is present.   7. Aortic dilatation noted. There is borderline dilatation of the  ascending aorta measuring 37 mm.   8. The inferior vena cava is normal in size with <50% respiratory  variability, suggesting right atrial pressure of 8 mmHg.    Laboratory Data:  High Sensitivity Troponin:   Recent Labs  Lab 03/19/20 1008 03/19/20 1424 03/20/20 0647  TROPONINIHS 8 88* 23*     Chemistry Recent Labs  Lab 03/19/20 1008 03/19/20 1015 03/20/20 0647  NA 139 139 140  K 3.3* 3.3* 3.9  CL 103 103 106  CO2 20*  --  27  GLUCOSE 214* 209* 96  BUN 10 10 7*  CREATININE 0.86 0.60 0.72  CALCIUM 9.4  --  9.1  GFRNONAA >60  --  >60  GFRAA >60  --  >60  ANIONGAP 16*  --  7     Recent Labs  Lab 03/19/20 1008 03/20/20 0647  PROT 6.4* 5.8*  ALBUMIN 3.9 3.6  AST 573* 258*  ALT 631* 433*  ALKPHOS 83 82  BILITOT 0.7 0.8   Hematology Recent Labs  Lab 03/19/20 1008 03/19/20 1015 03/20/20 0647  WBC 11.5*  --  5.5  RBC 4.71  --  4.46  HGB 14.0 13.6 13.0  HCT 41.4 40.0 40.0  MCV 87.9  --  89.7  MCH 29.7  --  29.1  MCHC 33.8  --  32.5  RDW 12.4  --  12.6  PLT 256  --  195   BNPNo results for input(s): BNP, PROBNP in the last 168 hours.  DDimer  Recent Labs  Lab 03/19/20 1424  DDIMER 10.36*     Radiology/Studies:   EEG Result Date: 03/19/2020 Lora Havens, MD     03/19/2020  4:07 PM Patient Name: Desteney Hogle MRN: [id number] Epilepsy Attending: Lora Havens Referring Physician/Provider: Etta Quill, PA Date: 03/19/2020 Duration: 24.15 mins Patient history:  64 year old female with no past medical history presenting to the emergency department after a syncopal episode followed by seizure-like jerking that lasted briefly. EEG to evaluate for seizure Level of alertness: Awake, asleep AEDs during EEG study: None Technical aspects: This EEG study was done with scalp electrodes positioned according to the 10-20 International system of electrode placement. Electrical activity was acquired at a sampling rate of 500Hz  and reviewed with a high frequency filter of 70Hz  and a low frequency filter of 1Hz . EEG data were recorded continuously and digitally stored. Description: The posterior dominant rhythm consists of 8.5-9 Hz activity of moderate voltage (25-35 uV) seen predominantly in posterior head regions, symmetric and reactive to eye opening and eye closing. Sleep was characterized by vertex waves, maximal frontocentral region.  Hyperventilation and photic stimulation were not performed.   IMPRESSION: This study is within normal limits. No seizures or epileptiform discharges were seen throughout the recording. Priyanka Barbra Sarks     CT ANGIO NECK W OR WO  CONTRAST Result Date: 03/19/2020 CLINICAL DATA:  64 year old female code stroke presentation. EXAM: CT ANGIOGRAPHY HEAD AND NECK TECHNIQUE: Multidetector CT imaging of the head and neck was performed using the standard protocol during bolus administration of intravenous contrast. Multiplanar CT image reconstructions and MIPs were obtained to evaluate the vascular anatomy. Carotid stenosis measurements (when applicable) are obtained utilizing NASCET criteria, using the distal internal carotid diameter as the denominator. CONTRAST:  50mL OMNIPAQUE IOHEXOL 350 MG/ML SOLN COMPARISON:  Plain head CT  1014 hours today. FINDINGS: CTA NECK Skeleton: Chronic C5-C6 disc and endplate degeneration in the cervical spine. No acute osseous abnormality identified. Upper chest: Negative. Other neck: Negative. Aortic arch: 3 vessel arch configuration. No arch atherosclerosis identified. Right carotid system: Negative. Left carotid system: Negative. Vertebral arteries: Proximal right subclavian artery and right vertebral artery origin are normal. The right vertebral is mildly tortuous and patent to the skull base without stenosis. No proximal left subclavian artery or left vertebral artery origin plaque or stenosis. Mildly dominant left vertebral is tortuous and patent to the skull base without stenosis. CTA HEAD A multiphase intracranial CTA was performed although accidentally, the vertex was excluded. Posterior circulation: Dominant left V4 segment with mild calcified plaque, only mild left V4 segment stenosis. Minimal right V4 plaque without stenosis. Normal right PICA origin. Patent vertebrobasilar junction is tortuous without stenosis. Patent basilar artery without stenosis. Normal SCA and PCA origins. Posterior communicating arteries are diminutive or absent. Bilateral PCA branches are within normal limits. Anterior circulation: Both ICA siphons are patent. Minimal siphon plaque and no stenosis. Patent carotid termini. Normal  MCA and ACA origins. Anterior communicating artery and visible ACA branches are within normal limits. Left MCA M1 segment and bifurcation are patent without stenosis. Left MCA branches are within normal limits. Right MCA M1 segment and trifurcation are patent. There is no right M2 branch occlusion. And no convincing right MCA branch occlusion. Multiphase: No additional findings. Venous sinuses: Superior sagittal sinus not included. Otherwise patent. Anatomic variants: Dominant left vertebral artery. Review of the MIP images confirms the above findings IMPRESSION: 1. Negative for large vessel occlusion. 2. Mild for age atherosclerosis, most pronounced in the dominant left vertebral V4 segment. No significant arterial stenosis in the head or neck. Electronically Signed   By: Genevie Ann M.D.   On: 03/19/2020 11:50    CT ANGIO CHEST PE W OR WO CONTRAST Result Date: 03/20/2020 CLINICAL DATA:  Cardiac arrest. EXAM: CT ANGIOGRAPHY CHEST WITH CONTRAST TECHNIQUE: Multidetector CT imaging of the chest was performed using the standard protocol during bolus administration of intravenous contrast. Multiplanar CT image reconstructions and MIPs were obtained to evaluate the vascular anatomy. CONTRAST:  52mL OMNIPAQUE IOHEXOL 350 MG/ML SOLN COMPARISON:  Chest x-ray dated 03/19/2020 FINDINGS: Cardiovascular: Satisfactory opacification of the pulmonary arteries to the segmental level. No evidence of pulmonary embolism. Normal heart size. No pericardial effusion. Mediastinum/Nodes: No enlarged mediastinal, hilar, or axillary lymph nodes. Thyroid gland, trachea, and esophagus demonstrate no significant findings. Lungs/Pleura: Slight atelectasis at the lung bases posteriorly. Otherwise normal. No effusions. Upper Abdomen: Normal. Musculoskeletal: Normal. Review of the MIP images confirms the above findings. IMPRESSION: No pulmonary emboli. Slight atelectasis at the lung bases posteriorly. Otherwise, normal exam. Electronically Signed    By: Jeneen Rinks  Maxwell M.D.   On: 03/20/2020 11:59    MR BRAIN WO CONTRAST Result Date: 03/19/2020 CLINICAL DATA:  Stroke, follow-up. EXAM: MRI HEAD WITHOUT CONTRAST TECHNIQUE: Multiplanar, multiecho pulse sequences of the brain and surrounding structures were obtained without intravenous contrast. COMPARISON:  Noncontrast head CT and CT angiogram head/neck performed earlier the same day 03/19/2020. 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 Elsewhere, there is no focal parenchymal signal abnormality within the brain. There is no acute infarct. No chronic intracranial blood products. No extra-axial fluid collection. No midline shift. Vascular: Expected proximal arterial flow voids. Skull and upper cervical spine: No focal marrow lesion. Sinuses/Orbits: Visualized orbits show no acute finding. Minimal ethmoid sinus mucosal thickening. No significant mastoid effusion. IMPRESSION: No evidence of acute infarct. 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 Primary differential considerations include a vestibular schwannoma or meningioma, although this finding is incompletely assessed on this noncontrast study. Contrast-enhanced brain MRI with internal auditory canal protocol is recommended for further characterization. Otherwise unremarkable MRI appearance of the brain. Mild ethmoid sinus mucosal thickening. Electronically Signed   By: Kellie Simmering DO   On: 03/19/2020 14:08     DG Chest Port 1 View Result Date: 03/19/2020 CLINICAL DATA:  Cardiac arrest with CPR EXAM: PORTABLE CHEST 1 VIEW COMPARISON:  None. FINDINGS: Normal heart size and mediastinal contours. No acute infiltrate or edema. Minimal retrocardiac scarring. No effusion or pneumothorax. No acute osseous findings. Artifact from EKG leads. IMPRESSION: Negative portable chest. Electronically Signed   By: Monte Fantasia  M.D.   On: 03/19/2020 10:46    CT HEAD CODE STROKE WO CONTRAST Result Date: 03/19/2020 CLINICAL DATA:  Code stroke.  64 year old female with ataxia. EXAM: CT HEAD WITHOUT CONTRAST TECHNIQUE: Contiguous axial images were obtained from the base of the skull through the vertex without intravenous contrast. COMPARISON:  None. FINDINGS: Brain: Cerebral volume is within normal limits for age. No midline shift, mass effect, or evidence of intracranial mass lesion. No ventriculomegaly. No acute intracranial hemorrhage identified. Gray-white matter differentiation is within normal limits for age. No acute cortically based infarct or chronic cortical encephalomalacia identified. Posterior fossa gray-white matter differentiation within normal limits. Vascular: Dominant left vertebral artery. Mild Calcified atherosclerosis at the skull base. No suspicious intracranial vascular hyperdensity. Skull: No acute osseous abnormality identified. Sinuses/Orbits: Visualized paranasal sinuses and mastoids are clear. Other: No acute orbit or scalp soft tissue finding. ASPECTS [facility] Stroke Program Early CT Score) Total score (0-10 with 10 being normal): 10 IMPRESSION: 1. Normal for age non contrast CT appearance of the brain. ASPECTS 10. 2. These results were communicated to Dr. Cheral Marker at 10:21 am on 03/19/2020 by text page via the [facility] messaging system. Electronically Signed   By: Genevie Ann M.D.   On: 03/19/2020 10:21    US Abdomen Limited RUQ Result Date: 03/19/2020 CLINICAL DATA:  Elevated LFTs EXAM: ULTRASOUND ABDOMEN LIMITED RIGHT UPPER QUADRANT COMPARISON:  None. FINDINGS: Gallbladder: Prior cholecystectomy Common bile duct: Diameter: Normal caliber, 5 mm Liver: No focal lesion identified. Within normal limits in parenchymal echogenicity. Portal vein is patent on color Doppler imaging with normal direction of blood flow towards the liver. Other: None. IMPRESSION: No acute findings. Electronically Signed   By: Rolm Baptise M.D.    On: 03/19/2020 23:24     Assessment and Plan:   1. Cardiac arrest     AED advised and  gave shock presumed VF  No known medical history Unremarkable EKG, echo Pending cath  If no significant CAD Judeth Gilles need c.MRI and an ICD  I have discussed [place] law no driving 6 months  Await cath findings  Dr. Curt Bears Khyleigh Furney see the patient later today    For questions or updates, please contact [facility] Please consult [email or web address] for contact info under     Signed, Baldwin Jamaica, PA-C  03/20/2020 1:32 PM  I have seen and examined this patient with Tommye Standard.  Agree with above, note added to reflect my findings.  On exam, RRR, no murmurs, lungs clear.  Presented to the hospital after witnessed cardiac arrest.  She had a CPR and an AED placed.  AED advised shock.  After her arrest, she had seizure-like activity.  She has had a seizure work-up without any conclusive finding.  It is certainly possible that her seizure-like activity was due to anoxic injury caused by her arrest.  She is plan for left heart catheterization tomorrow.  If this is not revealing, would plan for cardiac MRI.  There is nothing reversible found, would plan for ICD.  Shaquile Lutze M. Camella Seim MD 03/20/2020 3:50 PM

## 2020-03-20 NOTE — Progress Notes (Signed)
PROGRESS NOTE    Allison Cannon  [id number] DOB: 04-04-1956 DOA: 03/19/2020 PCP: Allison Cannon, No Pcp Per   Brief Narrative: 64 year old with no significant past medical history other than chronic left leg pain secondary to piriformis muscle compression, presents after a cardiopulmonary arrest and CPR.  Allison Cannon has been doing outpatient PT and workout class at the [facility] for leg pain.  At 8:30 AM, just before regular yoga class, Allison Cannon suddenly collapsed without any prodromes.  Downtime was minimal.  CPR restarted, at one point AED showed shockable rhythm, Allison Cannon had 1 shock delivered.  CPR was continued.  Allison Cannon will regain circulation.  On EMS arrival Allison Cannon was noted to have a normal rhythm.  On arrival to the ED Allison Cannon was noted to have a left gaze deviation and a slurry speech. Evaluation in the ED EKG with no significant changes, EEG negative for seizure.  Low magnesium and low potassium level.   Assessment & Plan:   Active Problems:   Cardiac arrest [facility])   LFT elevation   1-Cardiac Arrest, S/P CPR and shock by AED. CT angio negative for PE  ECHO ordered. EF 70-75 % Plan for cath tomorrow. Might need EP eval, evaluation for ICD>   2-Elevation of D dimer;  CT angio negative for PE>  Doppler ordered.   3-Hypokalemia/Hypomagnesemia;  Replaced.   Transaminases Related to  Liver hypoperfusion.  LFT trending down  [place] negative.   Seizure like activity post cardiac arrest;  Evaluated by neurology CT head no acute abnormalities  EEG no evidence of seizure.  MRI brain: 11 mm left cerebellopontine angle mass, vestibular schwannoma or meningioma.  Follow neurology recommendation.       Estimated body mass index is 21.19 kg/m as calculated from the following:   Height as of this encounter: 5\' 7"  (1.702 m).   Weight as of this encounter: 61.4 kg.   DVT prophylaxis: lovenox Code Status: Full code Family Communication: Husband was at bedside Disposition Plan:  Status  is: Inpatient  Remains inpatient appropriate because:Ongoing diagnostic testing needed not appropriate for outpatient work up   Dispo: The Allison Cannon is from: Home              Anticipated d/c is to: Home              Anticipated d/c date is: 2 days              Allison Cannon currently is not medically stable to d/c.         Consultants:   Cardiology   Procedures:   Doppler  Antimicrobials:    Subjective: Alert, conversant. She doesn't remember what happen. She only remember waking up surrounded by  People. Denies dyspnea or chest pain prior to episode. No family history of sudden death.   Objective: Vitals:   04/06/2020 0037 04/06/20 0342 04-06-20 0345 04-06-2020 0949  BP: 110/76  118/78 112/73  Pulse: 83  77 80  Resp: 16  14 16   Temp: 98.3 F (36.8 C)  99 F (37.2 C) 98.3 F (36.8 C)  TempSrc: Oral  Oral Oral  SpO2: 99%  97% 97%  Weight:  61.4 kg    Height:        Intake/Output Summary (Last 24 hours) at 04-06-20 1240 Last data filed at 03/19/2020 2012 Gross per 24 hour  Intake 240 ml  Output --  Net 240 ml   Filed Weights   03/19/20 1415 03/19/20 1652 04/06/20 0342  Weight: 61.2 kg 61.6 kg 61.4 kg  Examination:  General exam: Appears calm and comfortable  Respiratory system: Clear to auscultation. Respiratory effort normal. Cardiovascular system: S1 & S2 heard, RRR. No JVD, murmurs, rubs, gallops or clicks. No pedal edema. Gastrointestinal system: Abdomen is nondistended, soft and nontender. No organomegaly or masses felt. Normal bowel sounds heard. Central nervous system: Alert and oriented. No focal neurological deficits. Extremities: Symmetric 5 x 5 power. Skin: No rashes, lesions or ulcers Psychiatry: Judgement and insight appear normal. Mood & affect appropriate.     Data Reviewed: I have personally reviewed following labs and imaging studies  CBC: Recent Labs  Lab 03/19/20 1008 03/19/20 1015 03/20/20 0647  WBC 11.5*  --  5.5  NEUTROABS 7.5   --   --   HGB 14.0 13.6 13.0  HCT 41.4 40.0 40.0  MCV 87.9  --  89.7  PLT 256  --  0000000   Basic Metabolic Panel: Recent Labs  Lab 03/19/20 1008 03/19/20 1015 03/19/20 1424 03/20/20 0647  NA 139 139  --  140  K 3.3* 3.3*  --  3.9  CL 103 103  --  106  CO2 20*  --   --  27  GLUCOSE 214* 209*  --  96  BUN 10 10  --  7*  CREATININE 0.86 0.60  --  0.72  CALCIUM 9.4  --   --  9.1  MG  --   --  1.6* 1.9   GFR: Estimated Creatinine Clearance: 68.9 mL/min (by C-G formula based on SCr of 0.72 mg/dL). Liver Function Tests: Recent Labs  Lab 03/19/20 1008 03/20/20 0647  AST 573* 258*  ALT 631* 433*  ALKPHOS 83 82  BILITOT 0.7 0.8  PROT 6.4* 5.8*  ALBUMIN 3.9 3.6   No results for input(s): LIPASE, AMYLASE in the last 168 hours. No results for input(s): AMMONIA in the last 168 hours. Coagulation Profile: Recent Labs  Lab 03/19/20 1008  INR 1.0   Cardiac Enzymes: Recent Labs  Lab 03/19/20 1639  CKTOTAL 82   BNP (last 3 results) No results for input(s): PROBNP in the last 8760 hours. HbA1C: Recent Labs    03/19/20 1639  HGBA1C 5.5   CBG: No results for input(s): GLUCAP in the last 168 hours. Lipid Profile: No results for input(s): CHOL, HDL, LDLCALC, TRIG, CHOLHDL, LDLDIRECT in the last 72 hours. Thyroid Function Tests: Recent Labs    03/19/20 1424  TSH 1.195  FREET4 1.12   Anemia Panel: No results for input(s): VITAMINB12, FOLATE, FERRITIN, TIBC, IRON, RETICCTPCT in the last 72 hours. Sepsis Labs: No results for input(s): PROCALCITON, LATICACIDVEN in the last 168 hours.  Recent Results (from the past 240 hour(s))  SARS Coronavirus 2 by RT PCR (hospital order, performed in [facility] hospital lab) Nasopharyngeal Nasopharyngeal Swab     Status: None   Collection Time: 03/19/20 12:03 PM   Specimen: Nasopharyngeal Swab  Result Value Ref Range Status   SARS Coronavirus 2 NEGATIVE NEGATIVE Final    Comment: (NOTE) SARS-CoV-2 target nucleic acids are NOT  DETECTED. The SARS-CoV-2 RNA is generally detectable in upper and lower respiratory specimens during the acute phase of infection. The lowest concentration of SARS-CoV-2 viral copies this assay can detect is 250 copies / mL. A negative result does not preclude SARS-CoV-2 infection and should not be used as the sole basis for treatment or other Allison Cannon management decisions.  A negative result may occur with improper specimen collection / handling, submission of specimen other than nasopharyngeal swab, presence of  viral mutation(s) within the areas targeted by this assay, and inadequate number of viral copies (<250 copies / mL). A negative result must be combined with clinical observations, Allison Cannon history, and epidemiological information. Fact Sheet for Patients:   StrictlyIdeas.no Fact Sheet for Healthcare Providers: BankingDealers.co.za This test is not yet approved or cleared  by the [place] FDA and has been authorized for detection and/or diagnosis of SARS-CoV-2 by FDA under an Emergency Use Authorization (EUA).  This EUA will remain in effect (meaning this test can be used) for the duration of the COVID-19 declaration under Section 564(b)(1) of the Act, 21 U.S.C. section 360bbb-3(b)(1), unless the authorization is terminated or revoked sooner. Performed at [facility], [place] [address], [place], [place] [address]          Radiology Studies: EEG  Result Date: 03/19/2020 Lora Havens, MD     03/19/2020  4:07 PM Allison Cannon Name: Allison Cannon MRN: [id number] Epilepsy Attending: Lora Havens Referring Physician/Provider: Etta Quill, PA Date: 03/19/2020 Duration: 24.15 mins Allison Cannon history:  64 year old female with no past medical history presenting to the emergency department after a syncopal episode followed by seizure-like jerking that lasted briefly. EEG to evaluate for seizure Level of alertness: Awake, asleep AEDs during  EEG study: None Technical aspects: This EEG study was done with scalp electrodes positioned according to the 10-20 International system of electrode placement. Electrical activity was acquired at a sampling rate of 500Hz  and reviewed with a high frequency filter of 70Hz  and a low frequency filter of 1Hz . EEG data were recorded continuously and digitally stored. Description: The posterior dominant rhythm consists of 8.5-9 Hz activity of moderate voltage (25-35 uV) seen predominantly in posterior head regions, symmetric and reactive to eye opening and eye closing. Sleep was characterized by vertex waves, maximal frontocentral region.   Hyperventilation and photic stimulation were not performed.   IMPRESSION: This study is within normal limits. No seizures or epileptiform discharges were seen throughout the recording. Priyanka Barbra Sarks   CT ANGIO NECK W OR WO CONTRAST  Result Date: 03/19/2020 CLINICAL DATA:  64 year old female code stroke presentation. EXAM: CT ANGIOGRAPHY HEAD AND NECK TECHNIQUE: Multidetector CT imaging of the head and neck was performed using the standard protocol during bolus administration of intravenous contrast. Multiplanar CT image reconstructions and MIPs were obtained to evaluate the vascular anatomy. Carotid stenosis measurements (when applicable) are obtained utilizing NASCET criteria, using the distal internal carotid diameter as the denominator. CONTRAST:  49mL OMNIPAQUE IOHEXOL 350 MG/ML SOLN COMPARISON:  Plain head CT  1014 hours today. FINDINGS: CTA NECK Skeleton: Chronic C5-C6 disc and endplate degeneration in the cervical spine. No acute osseous abnormality identified. Upper chest: Negative. Other neck: Negative. Aortic arch: 3 vessel arch configuration. No arch atherosclerosis identified. Right carotid system: Negative. Left carotid system: Negative. Vertebral arteries: Proximal right subclavian artery and right vertebral artery origin are normal. The right vertebral is mildly  tortuous and patent to the skull base without stenosis. No proximal left subclavian artery or left vertebral artery origin plaque or stenosis. Mildly dominant left vertebral is tortuous and patent to the skull base without stenosis. CTA HEAD A multiphase intracranial CTA was performed although accidentally, the vertex was excluded. Posterior circulation: Dominant left V4 segment with mild calcified plaque, only mild left V4 segment stenosis. Minimal right V4 plaque without stenosis. Normal right PICA origin. Patent vertebrobasilar junction is tortuous without stenosis. Patent basilar artery without stenosis. Normal SCA and PCA origins. Posterior communicating arteries are  diminutive or absent. Bilateral PCA branches are within normal limits. Anterior circulation: Both ICA siphons are patent. Minimal siphon plaque and no stenosis. Patent carotid termini. Normal MCA and ACA origins. Anterior communicating artery and visible ACA branches are within normal limits. Left MCA M1 segment and bifurcation are patent without stenosis. Left MCA branches are within normal limits. Right MCA M1 segment and trifurcation are patent. There is no right M2 branch occlusion. And no convincing right MCA branch occlusion. Multiphase: No additional findings. Venous sinuses: Superior sagittal sinus not included. Otherwise patent. Anatomic variants: Dominant left vertebral artery. Review of the MIP images confirms the above findings IMPRESSION: 1. Negative for large vessel occlusion. 2. Mild for age atherosclerosis, most pronounced in the dominant left vertebral V4 segment. No significant arterial stenosis in the head or neck. Electronically Signed   By: Genevie Ann M.D.   On: 03/19/2020 11:50   CT ANGIO CHEST PE W OR WO CONTRAST  Result Date: 03/20/2020 CLINICAL DATA:  Cardiac arrest. EXAM: CT ANGIOGRAPHY CHEST WITH CONTRAST TECHNIQUE: Multidetector CT imaging of the chest was performed using the standard protocol during bolus administration  of intravenous contrast. Multiplanar CT image reconstructions and MIPs were obtained to evaluate the vascular anatomy. CONTRAST:  71mL OMNIPAQUE IOHEXOL 350 MG/ML SOLN COMPARISON:  Chest x-ray dated 03/19/2020 FINDINGS: Cardiovascular: Satisfactory opacification of the pulmonary arteries to the segmental level. No evidence of pulmonary embolism. Normal heart size. No pericardial effusion. Mediastinum/Nodes: No enlarged mediastinal, hilar, or axillary lymph nodes. Thyroid gland, trachea, and esophagus demonstrate no significant findings. Lungs/Pleura: Slight atelectasis at the lung bases posteriorly. Otherwise normal. No effusions. Upper Abdomen: Normal. Musculoskeletal: Normal. Review of the MIP images confirms the above findings. IMPRESSION: No pulmonary emboli. Slight atelectasis at the lung bases posteriorly. Otherwise, normal exam. Electronically Signed   By: Lorriane Shire M.D.   On: 03/20/2020 11:59   MR BRAIN WO CONTRAST  Result Date: 03/19/2020 CLINICAL DATA:  Stroke, follow-up. EXAM: MRI HEAD WITHOUT CONTRAST TECHNIQUE: Multiplanar, multiecho pulse sequences of the brain and surrounding structures were obtained without intravenous contrast. COMPARISON:  Noncontrast head CT and CT angiogram head/neck performed earlier the same day 03/19/2020. 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 Elsewhere, there is no focal parenchymal signal abnormality within the brain. There is no acute infarct. No chronic intracranial blood products. No extra-axial fluid collection. No midline shift. Vascular: Expected proximal arterial flow voids. Skull and upper cervical spine: No focal marrow lesion. Sinuses/Orbits: Visualized orbits show no acute finding. Minimal ethmoid sinus mucosal thickening. No significant mastoid effusion. IMPRESSION: No evidence of acute infarct. 11 mm left  cerebellopontine angle mass. Primary differential considerations include a vestibular schwannoma or meningioma, although this finding is incompletely assessed on this noncontrast study. Contrast-enhanced brain MRI with internal auditory canal protocol is recommended for further characterization. Otherwise unremarkable MRI appearance of the brain. Mild ethmoid sinus mucosal thickening. Electronically Signed   By: Kellie Simmering DO   On: 03/19/2020 14:08   DG Chest Port 1 View  Result Date: 03/19/2020 CLINICAL DATA:  Cardiac arrest with CPR EXAM: PORTABLE CHEST 1 VIEW COMPARISON:  None. FINDINGS: Normal heart size and mediastinal contours. No acute infiltrate or edema. Minimal retrocardiac scarring. No effusion or pneumothorax. No acute osseous findings. Artifact from EKG leads. IMPRESSION: Negative portable chest. Electronically Signed   By: Monte Fantasia M.D.   On: 03/19/2020 10:46  ECHOCARDIOGRAM COMPLETE  Result Date: 03/19/2020    ECHOCARDIOGRAM REPORT   Allison Cannon Name:   LISA FAUNTLEROY Date of Exam: 03/19/2020 Medical Rec #:  [id number]    Height:       67.0 in Accession #:    [id number]   Weight:       135.0 lb Date of Birth:  1956-03-02   BSA:          92.711 m Allison Cannon Age:    74 years     BP:           96/59 mmHg Allison Cannon Gender: F            HR:           95 bpm. Exam Location:  Inpatient Procedure: 2D Echo, Color Doppler and Cardiac Doppler Indications:    Cardiac Arrest  History:        Allison Cannon has no prior history of Echocardiogram examinations.  Sonographer:    Raquel Sarna Senior RDCS Referring Phys: [place]  1. Left ventricular ejection fraction, by estimation, is 70 to 75%. The left ventricle has hyperdynamic function. The left ventricle has no regional wall motion abnormalities. Left ventricular diastolic parameters were normal.  2. Right ventricular systolic function is normal. The right ventricular size is normal.  3. Left atrial size was mildly dilated.  4. Right atrial size was  mildly dilated.  5. The mitral valve is normal in structure. Trivial mitral valve regurgitation. No evidence of mitral stenosis.  6. The aortic valve is tricuspid. Aortic valve regurgitation is not visualized. No aortic stenosis is present.  7. Aortic dilatation noted. There is borderline dilatation of the ascending aorta measuring 37 mm.  8. The inferior vena cava is normal in size with <50% respiratory variability, suggesting right atrial pressure of 8 mmHg. Comparison(s): No prior Echocardiogram. Conclusion(s)/Recommendation(s): Normal biventricular function without evidence of hemodynamically significant valvular heart disease. FINDINGS  Left Ventricle: Left ventricular ejection fraction, by estimation, is 70 to 75%. The left ventricle has hyperdynamic function. The left ventricle has no regional wall motion abnormalities. The left ventricular internal cavity size was normal in size. There is no left ventricular hypertrophy. Left ventricular diastolic parameters were normal. Right Ventricle: The right ventricular size is normal. No increase in right ventricular wall thickness. Right ventricular systolic function is normal. Left Atrium: Left atrial size was mildly dilated. Right Atrium: Right atrial size was mildly dilated. Pericardium: Trivial pericardial effusion is present. Mitral Valve: The mitral valve is normal in structure. Trivial mitral valve regurgitation. No evidence of mitral valve stenosis. Tricuspid Valve: The tricuspid valve is normal in structure. Tricuspid valve regurgitation is trivial. No evidence of tricuspid stenosis. Aortic Valve: The aortic valve is tricuspid. Aortic valve regurgitation is not visualized. No aortic stenosis is present. Pulmonic Valve: The pulmonic valve was grossly normal. Pulmonic valve regurgitation is not visualized. Aorta: Aortic dilatation noted. There is borderline dilatation of the ascending aorta measuring 37 mm. Venous: The inferior vena cava is normal in size with  less than 50% respiratory variability, suggesting right atrial pressure of 8 mmHg. IAS/Shunts: No atrial level shunt detected by color flow Doppler. Additional Comments: There is a small pleural effusion in both left and right lateral regions.  LEFT VENTRICLE PLAX 2D LVIDd:         4.60 cm  Diastology LVIDs:         2.50 cm  LV e' lateral:   14.40 cm/s LV PW:  0.90 cm  LV E/e' lateral: 4.2 LV IVS:        1.00 cm  LV e' medial:    9.79 cm/s LVOT diam:     1.90 cm  LV E/e' medial:  6.1 LV SV:         61 LV SV Index:   36 LVOT Area:     2.84 cm  RIGHT VENTRICLE RV S prime:     14.90 cm/s TAPSE (M-mode): 2.9 cm LEFT ATRIUM           Index       RIGHT ATRIUM           Index LA diam:      2.50 cm 1.46 cm/m  RA Area:     19.00 cm LA Vol (A2C): 52.3 ml 30.56 ml/m RA Volume:   51.70 ml  30.21 ml/m LA Vol (A4C): 46.4 ml 27.12 ml/m  AORTIC VALVE LVOT Vmax:   119.00 cm/s LVOT Vmean:  79.200 cm/s LVOT VTI:    0.215 m  AORTA Ao Root diam: 3.20 cm Ao Asc diam:  3.70 cm MITRAL VALVE MV Area (PHT): 2.43 cm    SHUNTS MV Decel Time: 312 msec    Systemic VTI:  0.22 m MV E velocity: 60.00 cm/s  Systemic Diam: 1.90 cm MV A velocity: 57.40 cm/s MV E/A ratio:  1.05 Buford Dresser MD Electronically signed by Buford Dresser MD Signature Date/Time: 03/19/2020/5:23:57 PM    Final    CT HEAD CODE STROKE WO CONTRAST  Result Date: 03/19/2020 CLINICAL DATA:  Code stroke.  64 year old female with ataxia. EXAM: CT HEAD WITHOUT CONTRAST TECHNIQUE: Contiguous axial images were obtained from the base of the skull through the vertex without intravenous contrast. COMPARISON:  None. FINDINGS: Brain: Cerebral volume is within normal limits for age. No midline shift, mass effect, or evidence of intracranial mass lesion. No ventriculomegaly. No acute intracranial hemorrhage identified. Gray-white matter differentiation is within normal limits for age. No acute cortically based infarct or chronic cortical encephalomalacia  identified. Posterior fossa gray-white matter differentiation within normal limits. Vascular: Dominant left vertebral artery. Mild Calcified atherosclerosis at the skull base. No suspicious intracranial vascular hyperdensity. Skull: No acute osseous abnormality identified. Sinuses/Orbits: Visualized paranasal sinuses and mastoids are clear. Other: No acute orbit or scalp soft tissue finding. ASPECTS [facility] Stroke Program Early CT Score) Total score (0-10 with 10 being normal): 10 IMPRESSION: 1. Normal for age non contrast CT appearance of the brain. ASPECTS 10. 2. These results were communicated to Dr. Cheral Marker at 10:21 am on 03/19/2020 by text page via the [facility] messaging system. Electronically Signed   By: Genevie Ann M.D.   On: 03/19/2020 10:21   CT ANGIO HEAD CODE STROKE  Result Date: 03/19/2020 CLINICAL DATA:  64 year old female code stroke presentation. EXAM: CT ANGIOGRAPHY HEAD AND NECK TECHNIQUE: Multidetector CT imaging of the head and neck was performed using the standard protocol during bolus administration of intravenous contrast. Multiplanar CT image reconstructions and MIPs were obtained to evaluate the vascular anatomy. Carotid stenosis measurements (when applicable) are obtained utilizing NASCET criteria, using the distal internal carotid diameter as the denominator. CONTRAST:  77mL OMNIPAQUE IOHEXOL 350 MG/ML SOLN COMPARISON:  Plain head CT  1014 hours today. FINDINGS: CTA NECK Skeleton: Chronic C5-C6 disc and endplate degeneration in the cervical spine. No acute osseous abnormality identified. Upper chest: Negative. Other neck: Negative. Aortic arch: 3 vessel arch configuration. No arch atherosclerosis identified. Right carotid system: Negative. Left carotid system: Negative. Vertebral  arteries: Proximal right subclavian artery and right vertebral artery origin are normal. The right vertebral is mildly tortuous and patent to the skull base without stenosis. No proximal left subclavian artery or left  vertebral artery origin plaque or stenosis. Mildly dominant left vertebral is tortuous and patent to the skull base without stenosis. CTA HEAD A multiphase intracranial CTA was performed although accidentally, the vertex was excluded. Posterior circulation: Dominant left V4 segment with mild calcified plaque, only mild left V4 segment stenosis. Minimal right V4 plaque without stenosis. Normal right PICA origin. Patent vertebrobasilar junction is tortuous without stenosis. Patent basilar artery without stenosis. Normal SCA and PCA origins. Posterior communicating arteries are diminutive or absent. Bilateral PCA branches are within normal limits. Anterior circulation: Both ICA siphons are patent. Minimal siphon plaque and no stenosis. Patent carotid termini. Normal MCA and ACA origins. Anterior communicating artery and visible ACA branches are within normal limits. Left MCA M1 segment and bifurcation are patent without stenosis. Left MCA branches are within normal limits. Right MCA M1 segment and trifurcation are patent. There is no right M2 branch occlusion. And no convincing right MCA branch occlusion. Multiphase: No additional findings. Venous sinuses: Superior sagittal sinus not included. Otherwise patent. Anatomic variants: Dominant left vertebral artery. Review of the MIP images confirms the above findings IMPRESSION: 1. Negative for large vessel occlusion. 2. Mild for age atherosclerosis, most pronounced in the dominant left vertebral V4 segment. No significant arterial stenosis in the head or neck. Electronically Signed   By: Genevie Ann M.D.   On: 03/19/2020 11:50   US Abdomen Limited RUQ  Result Date: 03/19/2020 CLINICAL DATA:  Elevated LFTs EXAM: ULTRASOUND ABDOMEN LIMITED RIGHT UPPER QUADRANT COMPARISON:  None. FINDINGS: Gallbladder: Prior cholecystectomy Common bile duct: Diameter: Normal caliber, 5 mm Liver: No focal lesion identified. Within normal limits in parenchymal echogenicity. Portal vein is  patent on color Doppler imaging with normal direction of blood flow towards the liver. Other: None. IMPRESSION: No acute findings. Electronically Signed   By: Rolm Baptise M.D.   On: 03/19/2020 23:24        Scheduled Meds: . aspirin EC  81 mg Oral Daily  . enoxaparin (LOVENOX) injection  1 mg/kg Subcutaneous Q12H  . ondansetron (ZOFRAN) IV  4 mg Intravenous Once   Continuous Infusions:   LOS: 1 day    Time spent: 35 minutes.    Elmarie Shiley, MD [facility]   If 7PM-7AM, please contact night-coverage [email or web address]  03/20/2020, 12:40 PM

## 2020-03-20 NOTE — Plan of Care (Signed)

## 2020-03-20 NOTE — Progress Notes (Signed)
Subjective: Back to baseline. Complains of focal chest discomfort from prior CPR.   Objective: Current vital signs: BP 118/78 (BP Location: Left Arm)   Pulse 77   Temp 99 F (37.2 C) (Oral)   Resp 14   Ht 5\' 7"  (1.702 m)   Wt 61.4 kg   SpO2 97%   BMI 21.19 kg/m  Vital signs in last 24 hours: Temp:  [97.4 F (36.3 C)-99 F (37.2 C)] 99 F (37.2 C) (06/02 0345) Pulse Rate:  [77-99] 77 (06/02 0345) Resp:  [13-22] 14 (06/02 0345) BP: (96-128)/(59-78) 118/78 (06/02 0345) SpO2:  [96 %-100 %] 97 % (06/02 0345) Weight:  [61.2 kg-61.6 kg] 61.4 kg (06/02 0342)  Intake/Output from previous day: 06/01 0701 - 06/02 0700 In: 240 [P.O.:240] Out: -  Intake/Output this shift: No intake/output data recorded. Nutritional status:  Diet Order            Diet regular Room service appropriate? Yes; Fluid consistency: Thin  Diet effective now             HEENT: [facility]/AT Lungs: Respirations unlabored Ext: No edema  Neurologic Exam: Ment: Alert and oriented. Speech fluent with intact comprehension. Good insight.  CN: Fixates and tracks normally. Eyes conjugate. Face symmetric. Hearing intact to conversation. Phonation intact.  Motor: No asymmetry noted. Cerebellar: No ataxia appreciated.   Lab Results: Results for orders placed or performed during the hospital encounter of 03/19/20 (from the past 48 hour(s))  Ethanol     Status: None   Collection Time: 03/19/20 10:08 AM  Result Value Ref Range   Alcohol, Ethyl (B) <10 <10 mg/dL    Comment: (NOTE) Lowest detectable limit for serum alcohol is 10 mg/dL. For medical purposes only. Performed at [facility], [place] [address] [place], [place] [address]   Protime-INR     Status: None   Collection Time: 03/19/20 10:08 AM  Result Value Ref Range   Prothrombin Time 13.2 11.4 - 15.2 seconds   INR 1.0 0.8 - 1.2    Comment: (NOTE) INR goal varies based on device and disease states. Performed at [facility], [place] [address] [place], [place] [address]   APTT     Status: None   Collection Time: 03/19/20 10:08 AM  Result Value Ref Range   aPTT 24 24 - 36 seconds    Comment: Performed at [facility] [address] [place], [place] [address]  CBC     Status: Abnormal   Collection Time: 03/19/20 10:08 AM  Result Value Ref Range   WBC 11.5 (H) 4.0 - 10.5 K/uL   RBC 4.71 3.87 - 5.11 MIL/uL   Hemoglobin 14.0 12.0 - 15.0 g/dL   HCT 41.4 36.0 - 46.0 %   MCV 87.9 80.0 - 100.0 fL   MCH 29.7 26.0 - 34.0 pg   MCHC 33.8 30.0 - 36.0 g/dL   RDW 12.4 11.5 - 15.5 %   Platelets 256 150 - 400 K/uL   nRBC 0.0 0.0 - 0.2 %    Comment: Performed at [facility], [place] [address], [place], [place] [address]  Differential     Status: Abnormal   Collection Time: 03/19/20 10:08 AM  Result Value Ref Range   Neutrophils Relative % 65 %   Neutro Abs 7.5 1.7 - 7.7 K/uL   Lymphocytes Relative 27 %   Lymphs Abs 3.1 0.7 - 4.0 K/uL   Monocytes Relative 5 %   Monocytes Absolute  0.6 0.1 - 1.0 K/uL   Eosinophils Relative 1 %   Eosinophils Absolute 0.1 0.0 - 0.5 K/uL   Basophils Relative 1 %   Basophils Absolute 0.1 0.0 - 0.1 K/uL   Immature Granulocytes 1 %   Abs Immature Granulocytes 0.10 (H) 0.00 - 0.07 K/uL    Comment: Performed at [facility] [address], [place], [place] [address]  Comprehensive metabolic panel     Status: Abnormal   Collection Time: 03/19/20 10:08 AM  Result Value Ref Range   Sodium 139 135 - 145 mmol/L   Potassium 3.3 (L) 3.5 - 5.1 mmol/L   Chloride 103 98 - 111 mmol/L   CO2 20 (L) 22 - 32 mmol/L   Glucose, Bld 214 (H) 70 - 99 mg/dL    Comment: Glucose reference range applies only to samples taken after fasting for at least 8 hours.   BUN 10 8 - 23 mg/dL   Creatinine, Ser 0.86 0.44 - 1.00 mg/dL   Calcium 9.4 8.9 - 10.3 mg/dL   Total Protein 6.4 (L) 6.5 - 8.1 g/dL   Albumin 3.9 3.5 - 5.0 g/dL   AST 573 (H) 15 - 41 U/L   ALT 631 (H) 0 - 44 U/L   Alkaline Phosphatase 83 38 - 126  U/L   Total Bilirubin 0.7 0.3 - 1.2 mg/dL   GFR calc non Af Amer >60 >60 mL/min   GFR calc Af Amer >60 >60 mL/min   Anion gap 16 (H) 5 - 15    Comment: Performed at [facility] [address], [place], [place] [address]  Troponin I (High Sensitivity)     Status: None   Collection Time: 03/19/20 10:08 AM  Result Value Ref Range   Troponin I (High Sensitivity) 8 <18 ng/L    Comment: (NOTE) Elevated high sensitivity troponin I (hsTnI) values and significant  changes across serial measurements may suggest ACS but many other  chronic and acute conditions are known to elevate hsTnI results.  Refer to the "Links" section for chest pain algorithms and additional  guidance. Performed at [facility], [place] [address], [place], [place] [address]   I-stat chem 8, ED     Status: Abnormal   Collection Time: 03/19/20 10:15 AM  Result Value Ref Range   Sodium 139 135 - 145 mmol/L   Potassium 3.3 (L) 3.5 - 5.1 mmol/L   Chloride 103 98 - 111 mmol/L   BUN 10 8 - 23 mg/dL   Creatinine, Ser 0.60 0.44 - 1.00 mg/dL   Glucose, Bld 209 (H) 70 - 99 mg/dL    Comment: Glucose reference range applies only to samples taken after fasting for at least 8 hours.   Calcium, Ion 1.08 (L) 1.15 - 1.40 mmol/L   TCO2 22 22 - 32 mmol/L   Hemoglobin 13.6 12.0 - 15.0 g/dL   HCT 40.0 36.0 - 46.0 %  SARS Coronavirus 2 by RT PCR (hospital order, performed in [facility] hospital lab) Nasopharyngeal Nasopharyngeal Swab     Status: None   Collection Time: 03/19/20 12:03 PM   Specimen: Nasopharyngeal Swab  Result Value Ref Range   SARS Coronavirus 2 NEGATIVE NEGATIVE    Comment: (NOTE) SARS-CoV-2 target nucleic acids are NOT DETECTED. The SARS-CoV-2 RNA is generally detectable in upper and lower respiratory specimens during the acute phase of infection. The lowest concentration of SARS-CoV-2 viral copies this assay can detect is 250 copies / mL. A negative result  does not preclude SARS-CoV-2 infection and  should not be used as the sole basis for treatment or other patient management decisions.  A negative result may occur with improper specimen collection / handling, submission of specimen other than nasopharyngeal swab, presence of viral mutation(s) within the areas targeted by this assay, and inadequate number of viral copies (<250 copies / mL). A negative result must be combined with clinical observations, patient history, and epidemiological information. Fact Sheet for Patients:   StrictlyIdeas.no Fact Sheet for Healthcare Providers: BankingDealers.co.za This test is not yet approved or cleared  by the [place] FDA and has been authorized for detection and/or diagnosis of SARS-CoV-2 by FDA under an Emergency Use Authorization (EUA).  This EUA will remain in effect (meaning this test can be used) for the duration of the COVID-19 declaration under Section 564(b)(1) of the Act, 21 U.S.C. section 360bbb-3(b)(1), unless the authorization is terminated or revoked sooner. Performed at [facility], [place] [address], [place], [place] [address]   Troponin I (High Sensitivity)     Status: Abnormal   Collection Time: 03/19/20  2:24 PM  Result Value Ref Range   Troponin I (High Sensitivity) 88 (H) <18 ng/L    Comment: RESULT CALLED TO, READ BACK BY AND VERIFIED WITH: B.BECK RN 1600 03/19/20 MCCORMICK K (NOTE) Elevated high sensitivity troponin I (hsTnI) values and significant  changes across serial measurements may suggest ACS but many other  chronic and acute conditions are known to elevate hsTnI results.  Refer to the Links section for chest pain algorithms and additional  guidance. Performed at [facility], [place] [address], [place], [place] [address]   TSH     Status: None   Collection Time: 03/19/20  2:24 PM  Result Value Ref Range   TSH 1.195 0.350 - 4.500 uIU/mL    Comment: Performed by a 3rd Generation assay with a  functional sensitivity of <=0.01 uIU/mL. Performed at [facility], [place] [address], [place], [place] [address]   T4, free     Status: None   Collection Time: 03/19/20  2:24 PM  Result Value Ref Range   Free T4 1.12 0.61 - 1.12 ng/dL    Comment: (NOTE) Biotin ingestion may interfere with free T4 tests. If the results are inconsistent with the TSH level, previous test results, or the clinical presentation, then consider biotin interference. If needed, order repeat testing after stopping biotin. Performed at [facility], [place] [address], [place], [place] [address]   D-dimer, quantitative (not at [facility])     Status: Abnormal   Collection Time: 03/19/20  2:24 PM  Result Value Ref Range   D-Dimer, Quant 10.36 (H) 0.00 - 0.50 ug/mL-FEU    Comment: (NOTE) At the manufacturer cut-off of 0.50 ug/mL FEU, this assay has been documented to exclude PE with a sensitivity and negative predictive value of 97 to 99%.  At this time, this assay has not been approved by the FDA to exclude DVT/VTE. Results should be correlated with clinical presentation. Performed at [facility], [place] [address] [place], [place] [address]   Magnesium     Status: Abnormal   Collection Time: 03/19/20  2:24 PM  Result Value Ref Range   Magnesium 1.6 (L) 1.7 - 2.4 mg/dL    Comment: Performed at [facility] [address], [place], [place] [address]  Hemoglobin A1c     Status: None   Collection Time: 03/19/20  4:39  PM  Result Value Ref Range   Hgb A1c MFr Bld 5.5 4.8 - 5.6 %    Comment: (NOTE) Pre diabetes:          5.7%-6.4% Diabetes:              >6.4% Glycemic control for   <7.0% adults with diabetes    Mean Plasma Glucose 111.15 mg/dL    Comment: Performed at [facility] [address], [place], [place] [address]  CK     Status: None   Collection Time: 03/19/20  4:39 PM  Result Value Ref Range   Total CK 82 38 - 234 U/L    Comment: Performed at [facility], [place] [address], [place], [place] [address]  CBC     Status: None   Collection Time: 03/20/20  6:47 AM  Result Value Ref Range   WBC 5.5 4.0 - 10.5 K/uL   RBC 4.46 3.87 - 5.11 MIL/uL   Hemoglobin 13.0 12.0 - 15.0 g/dL   HCT 40.0 36.0 - 46.0 %   MCV 89.7 80.0 - 100.0 fL   MCH 29.1 26.0 - 34.0 pg   MCHC 32.5 30.0 - 36.0 g/dL   RDW 12.6 11.5 - 15.5 %   Platelets 195 150 - 400 K/uL   nRBC 0.0 0.0 - 0.2 %    Comment: Performed at [facility], [place] [address], [place], [place] [address]  Comprehensive metabolic panel     Status: Abnormal   Collection Time: 03/20/20  6:47 AM  Result Value Ref Range   Sodium 140 135 - 145 mmol/L   Potassium 3.9 3.5 - 5.1 mmol/L   Chloride 106 98 - 111 mmol/L   CO2 27 22 - 32 mmol/L   Glucose, Bld 96 70 - 99 mg/dL    Comment: Glucose reference range applies only to samples taken after fasting for at least 8 hours.   BUN 7 (L) 8 - 23 mg/dL   Creatinine, Ser 0.72 0.44 - 1.00 mg/dL   Calcium 9.1 8.9 - 10.3 mg/dL   Total Protein 5.8 (L) 6.5 - 8.1 g/dL   Albumin 3.6 3.5 - 5.0 g/dL   AST 258 (H) 15 - 41 U/L   ALT 433 (H) 0 - 44 U/L   Alkaline Phosphatase 82 38 - 126 U/L   Total Bilirubin 0.8 0.3 - 1.2 mg/dL   GFR calc non Af Amer >60 >60 mL/min   GFR calc Af Amer >60 >60 mL/min   Anion gap 7 5 - 15    Comment: Performed at [facility] [address] [place], [place] [address]  Magnesium     Status: None   Collection Time: 03/20/20  6:47 AM  Result Value Ref Range   Magnesium 1.9 1.7 - 2.4 mg/dL    Comment: Performed at [facility] [address], [place], [place] [address]  Troponin I (High Sensitivity)     Status: Abnormal   Collection Time: 03/20/20  6:47 AM  Result Value Ref Range   Troponin I (High Sensitivity) 23 (H) <18 ng/L    Comment: (NOTE) Elevated high sensitivity troponin I (hsTnI) values and significant  changes across serial measurements may suggest ACS but many other  chronic and acute conditions are known to elevate hsTnI  results.  Refer to the Links section for chest pain algorithms and additional  guidance. Performed at [facility], [place] [address] [place], [place] [address]  Recent Results (from the past 240 hour(s))  SARS Coronavirus 2 by RT PCR (hospital order, performed in [facility] hospital lab) Nasopharyngeal Nasopharyngeal Swab     Status: None   Collection Time: 03/19/20 12:03 PM   Specimen: Nasopharyngeal Swab  Result Value Ref Range Status   SARS Coronavirus 2 NEGATIVE NEGATIVE Final    Comment: (NOTE) SARS-CoV-2 target nucleic acids are NOT DETECTED. The SARS-CoV-2 RNA is generally detectable in upper and lower respiratory specimens during the acute phase of infection. The lowest concentration of SARS-CoV-2 viral copies this assay can detect is 250 copies / mL. A negative result does not preclude SARS-CoV-2 infection and should not be used as the sole basis for treatment or other patient management decisions.  A negative result may occur with improper specimen collection / handling, submission of specimen other than nasopharyngeal swab, presence of viral mutation(s) within the areas targeted by this assay, and inadequate number of viral copies (<250 copies / mL). A negative result must be combined with clinical observations, patient history, and epidemiological information. Fact Sheet for Patients:   StrictlyIdeas.no Fact Sheet for Healthcare Providers: BankingDealers.co.za This test is not yet approved or cleared  by the [place] FDA and has been authorized for detection and/or diagnosis of SARS-CoV-2 by FDA under an Emergency Use Authorization (EUA).  This EUA will remain in effect (meaning this test can be used) for the duration of the COVID-19 declaration under Section 564(b)(1) of the Act, 21 U.S.C. section 360bbb-3(b)(1), unless the authorization is terminated or revoked sooner. Performed at [facility],  [place] [address] [place], [place] [address]     Lipid Panel No results for input(s): CHOL, TRIG, HDL, CHOLHDL, VLDL, LDLCALC in the last 72 hours.  Studies/Results: EEG  Result Date: 03/19/2020 Lora Havens, MD     03/19/2020  4:07 PM [name] MRN: [id number] Epilepsy Attending: Lora Havens Referring Physician/Provider: Etta Quill, PA Date: 03/19/2020 Duration: 24.15 mins Patient history:  64 year old female with no past medical history presenting to the emergency department after a syncopal episode followed by seizure-like jerking that lasted briefly. EEG to evaluate for seizure Level of alertness: Awake, asleep AEDs during EEG study: None Technical aspects: This EEG study was done with scalp electrodes positioned according to the 10-20 International system of electrode placement. Electrical activity was acquired at a sampling rate of 500Hz  and reviewed with a high frequency filter of 70Hz  and a low frequency filter of 1Hz . EEG data were recorded continuously and digitally stored. Description: The posterior dominant rhythm consists of 8.5-9 Hz activity of moderate voltage (25-35 uV) seen predominantly in posterior head regions, symmetric and reactive to eye opening and eye closing. Sleep was characterized by vertex waves, maximal frontocentral region.   Hyperventilation and photic stimulation were not performed.   IMPRESSION: This study is within normal limits. No seizures or epileptiform discharges were seen throughout the recording. Priyanka Barbra Sarks   CT ANGIO NECK W OR WO CONTRAST  Result Date: 03/19/2020 CLINICAL DATA:  64 year old female code stroke presentation. EXAM: CT ANGIOGRAPHY HEAD AND NECK TECHNIQUE: Multidetector CT imaging of the head and neck was performed using the standard protocol during bolus administration of intravenous contrast. Multiplanar CT image reconstructions and MIPs were obtained to evaluate the vascular anatomy. Carotid stenosis measurements (when  applicable) are obtained utilizing NASCET criteria, using the distal internal carotid diameter as the denominator. CONTRAST:  70mL OMNIPAQUE IOHEXOL 350 MG/ML SOLN COMPARISON:  Plain head CT  1014  hours today. FINDINGS: CTA NECK Skeleton: Chronic C5-C6 disc and endplate degeneration in the cervical spine. No acute osseous abnormality identified. Upper chest: Negative. Other neck: Negative. Aortic arch: 3 vessel arch configuration. No arch atherosclerosis identified. Right carotid system: Negative. Left carotid system: Negative. Vertebral arteries: Proximal right subclavian artery and right vertebral artery origin are normal. The right vertebral is mildly tortuous and patent to the skull base without stenosis. No proximal left subclavian artery or left vertebral artery origin plaque or stenosis. Mildly dominant left vertebral is tortuous and patent to the skull base without stenosis. CTA HEAD A multiphase intracranial CTA was performed although accidentally, the vertex was excluded. Posterior circulation: Dominant left V4 segment with mild calcified plaque, only mild left V4 segment stenosis. Minimal right V4 plaque without stenosis. Normal right PICA origin. Patent vertebrobasilar junction is tortuous without stenosis. Patent basilar artery without stenosis. Normal SCA and PCA origins. Posterior communicating arteries are diminutive or absent. Bilateral PCA branches are within normal limits. Anterior circulation: Both ICA siphons are patent. Minimal siphon plaque and no stenosis. Patent carotid termini. Normal MCA and ACA origins. Anterior communicating artery and visible ACA branches are within normal limits. Left MCA M1 segment and bifurcation are patent without stenosis. Left MCA branches are within normal limits. Right MCA M1 segment and trifurcation are patent. There is no right M2 branch occlusion. And no convincing right MCA branch occlusion. Multiphase: No additional findings. Venous sinuses: Superior  sagittal sinus not included. Otherwise patent. Anatomic variants: Dominant left vertebral artery. Review of the MIP images confirms the above findings IMPRESSION: 1. Negative for large vessel occlusion. 2. Mild for age atherosclerosis, most pronounced in the dominant left vertebral V4 segment. No significant arterial stenosis in the head or neck. Electronically Signed   By: Genevie Ann M.D.   On: 03/19/2020 11:50   MR BRAIN WO CONTRAST  Result Date: 03/19/2020 CLINICAL DATA:  Stroke, follow-up. EXAM: MRI HEAD WITHOUT CONTRAST TECHNIQUE: Multiplanar, multiecho pulse sequences of the brain and surrounding structures were obtained without intravenous contrast. COMPARISON:  Noncontrast head CT and CT angiogram head/neck performed earlier the same day 03/19/2020. 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 Elsewhere, there is no focal parenchymal signal abnormality within the brain. There is no acute infarct. No chronic intracranial blood products. No extra-axial fluid collection. No midline shift. Vascular: Expected proximal arterial flow voids. Skull and upper cervical spine: No focal marrow lesion. Sinuses/Orbits: Visualized orbits show no acute finding. Minimal ethmoid sinus mucosal thickening. No significant mastoid effusion. IMPRESSION: No evidence of acute infarct. 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 Primary differential considerations include a vestibular schwannoma or meningioma, although this finding is incompletely assessed on this noncontrast study. Contrast-enhanced brain MRI with internal auditory canal protocol is recommended for further characterization. Otherwise unremarkable MRI appearance of the brain. Mild ethmoid sinus mucosal thickening. Electronically Signed   By: Kellie Simmering DO   On: 03/19/2020 14:08   DG Chest Port 1 View  Result Date:  03/19/2020 CLINICAL DATA:  Cardiac arrest with CPR EXAM: PORTABLE CHEST 1 VIEW COMPARISON:  None. FINDINGS: Normal heart size and mediastinal contours. No acute infiltrate or edema. Minimal retrocardiac scarring. No effusion or pneumothorax. No acute osseous findings. Artifact from EKG leads. IMPRESSION: Negative portable chest. Electronically Signed   By: Monte Fantasia M.D.   On: 03/19/2020 10:46   ECHOCARDIOGRAM COMPLETE  Result  Date: 03/19/2020    ECHOCARDIOGRAM REPORT   Patient Name:   Allison Cannon Date of Exam: 03/19/2020 Medical Rec #:  [id number]    Height:       67.0 in Accession #:    [id number]   Weight:       135.0 lb Date of Birth:  02-25-1956   BSA:          42.711 m Patient Age:    64 years     BP:           96/59 mmHg Patient Gender: F            HR:           95 bpm. Exam Location:  Inpatient Procedure: 2D Echo, Color Doppler and Cardiac Doppler Indications:    Cardiac Arrest  History:        Patient has no prior history of Echocardiogram examinations.  Sonographer:    Raquel Sarna Senior RDCS Referring Phys: [place]  1. Left ventricular ejection fraction, by estimation, is 70 to 75%. The left ventricle has hyperdynamic function. The left ventricle has no regional wall motion abnormalities. Left ventricular diastolic parameters were normal.  2. Right ventricular systolic function is normal. The right ventricular size is normal.  3. Left atrial size was mildly dilated.  4. Right atrial size was mildly dilated.  5. The mitral valve is normal in structure. Trivial mitral valve regurgitation. No evidence of mitral stenosis.  6. The aortic valve is tricuspid. Aortic valve regurgitation is not visualized. No aortic stenosis is present.  7. Aortic dilatation noted. There is borderline dilatation of the ascending aorta measuring 37 mm.  8. The inferior vena cava is normal in size with <50% respiratory variability, suggesting right atrial pressure of 8 mmHg. Comparison(s): No prior  Echocardiogram. Conclusion(s)/Recommendation(s): Normal biventricular function without evidence of hemodynamically significant valvular heart disease. FINDINGS  Left Ventricle: Left ventricular ejection fraction, by estimation, is 70 to 75%. The left ventricle has hyperdynamic function. The left ventricle has no regional wall motion abnormalities. The left ventricular internal cavity size was normal in size. There is no left ventricular hypertrophy. Left ventricular diastolic parameters were normal. Right Ventricle: The right ventricular size is normal. No increase in right ventricular wall thickness. Right ventricular systolic function is normal. Left Atrium: Left atrial size was mildly dilated. Right Atrium: Right atrial size was mildly dilated. Pericardium: Trivial pericardial effusion is present. Mitral Valve: The mitral valve is normal in structure. Trivial mitral valve regurgitation. No evidence of mitral valve stenosis. Tricuspid Valve: The tricuspid valve is normal in structure. Tricuspid valve regurgitation is trivial. No evidence of tricuspid stenosis. Aortic Valve: The aortic valve is tricuspid. Aortic valve regurgitation is not visualized. No aortic stenosis is present. Pulmonic Valve: The pulmonic valve was grossly normal. Pulmonic valve regurgitation is not visualized. Aorta: Aortic dilatation noted. There is borderline dilatation of the ascending aorta measuring 37 mm. Venous: The inferior vena cava is normal in size with less than 50% respiratory variability, suggesting right atrial pressure of 8 mmHg. IAS/Shunts: No atrial level shunt detected by color flow Doppler. Additional Comments: There is a small pleural effusion in both left and right lateral regions.  LEFT VENTRICLE PLAX 2D LVIDd:         4.60 cm  Diastology LVIDs:         2.50 cm  LV e' lateral:   14.40 cm/s LV PW:         0.90  cm  LV E/e' lateral: 4.2 LV IVS:        1.00 cm  LV e' medial:    9.79 cm/s LVOT diam:     1.90 cm  LV E/e'  medial:  6.1 LV SV:         61 LV SV Index:   36 LVOT Area:     2.84 cm  RIGHT VENTRICLE RV S prime:     14.90 cm/s TAPSE (M-mode): 2.9 cm LEFT ATRIUM           Index       RIGHT ATRIUM           Index LA diam:      2.50 cm 1.46 cm/m  RA Area:     19.00 cm LA Vol (A2C): 52.3 ml 30.56 ml/m RA Volume:   51.70 ml  30.21 ml/m LA Vol (A4C): 46.4 ml 27.12 ml/m  AORTIC VALVE LVOT Vmax:   119.00 cm/s LVOT Vmean:  79.200 cm/s LVOT VTI:    0.215 m  AORTA Ao Root diam: 3.20 cm Ao Asc diam:  3.70 cm MITRAL VALVE MV Area (PHT): 2.43 cm    SHUNTS MV Decel Time: 312 msec    Systemic VTI:  0.22 m MV E velocity: 60.00 cm/s  Systemic Diam: 1.90 cm MV A velocity: 57.40 cm/s MV E/A ratio:  1.05 Buford Dresser MD Electronically signed by Buford Dresser MD Signature Date/Time: 03/19/2020/5:23:57 PM    Final    CT HEAD CODE STROKE WO CONTRAST  Result Date: 03/19/2020 CLINICAL DATA:  Code stroke.  64 year old female with ataxia. EXAM: CT HEAD WITHOUT CONTRAST TECHNIQUE: Contiguous axial images were obtained from the base of the skull through the vertex without intravenous contrast. COMPARISON:  None. FINDINGS: Brain: Cerebral volume is within normal limits for age. No midline shift, mass effect, or evidence of intracranial mass lesion. No ventriculomegaly. No acute intracranial hemorrhage identified. Gray-white matter differentiation is within normal limits for age. No acute cortically based infarct or chronic cortical encephalomalacia identified. Posterior fossa gray-white matter differentiation within normal limits. Vascular: Dominant left vertebral artery. Mild Calcified atherosclerosis at the skull base. No suspicious intracranial vascular hyperdensity. Skull: No acute osseous abnormality identified. Sinuses/Orbits: Visualized paranasal sinuses and mastoids are clear. Other: No acute orbit or scalp soft tissue finding. ASPECTS [facility] Stroke Program Early CT Score) Total score (0-10 with 10 being normal): 10  IMPRESSION: 1. Normal for age non contrast CT appearance of the brain. ASPECTS 10. 2. These results were communicated to Dr. Cheral Marker at 10:21 am on 03/19/2020 by text page via the [facility] messaging system. Electronically Signed   By: Genevie Ann M.D.   On: 03/19/2020 10:21   CT ANGIO HEAD CODE STROKE  Result Date: 03/19/2020 CLINICAL DATA:  64 year old female code stroke presentation. EXAM: CT ANGIOGRAPHY HEAD AND NECK TECHNIQUE: Multidetector CT imaging of the head and neck was performed using the standard protocol during bolus administration of intravenous contrast. Multiplanar CT image reconstructions and MIPs were obtained to evaluate the vascular anatomy. Carotid stenosis measurements (when applicable) are obtained utilizing NASCET criteria, using the distal internal carotid diameter as the denominator. CONTRAST:  22mL OMNIPAQUE IOHEXOL 350 MG/ML SOLN COMPARISON:  Plain head CT  1014 hours today. FINDINGS: CTA NECK Skeleton: Chronic C5-C6 disc and endplate degeneration in the cervical spine. No acute osseous abnormality identified. Upper chest: Negative. Other neck: Negative. Aortic arch: 3 vessel arch configuration. No arch atherosclerosis identified. Right carotid system: Negative. Left carotid system: Negative. Vertebral  arteries: Proximal right subclavian artery and right vertebral artery origin are normal. The right vertebral is mildly tortuous and patent to the skull base without stenosis. No proximal left subclavian artery or left vertebral artery origin plaque or stenosis. Mildly dominant left vertebral is tortuous and patent to the skull base without stenosis. CTA HEAD A multiphase intracranial CTA was performed although accidentally, the vertex was excluded. Posterior circulation: Dominant left V4 segment with mild calcified plaque, only mild left V4 segment stenosis. Minimal right V4 plaque without stenosis. Normal right PICA origin. Patent vertebrobasilar junction is tortuous without stenosis. Patent  basilar artery without stenosis. Normal SCA and PCA origins. Posterior communicating arteries are diminutive or absent. Bilateral PCA branches are within normal limits. Anterior circulation: Both ICA siphons are patent. Minimal siphon plaque and no stenosis. Patent carotid termini. Normal MCA and ACA origins. Anterior communicating artery and visible ACA branches are within normal limits. Left MCA M1 segment and bifurcation are patent without stenosis. Left MCA branches are within normal limits. Right MCA M1 segment and trifurcation are patent. There is no right M2 branch occlusion. And no convincing right MCA branch occlusion. Multiphase: No additional findings. Venous sinuses: Superior sagittal sinus not included. Otherwise patent. Anatomic variants: Dominant left vertebral artery. Review of the MIP images confirms the above findings IMPRESSION: 1. Negative for large vessel occlusion. 2. Mild for age atherosclerosis, most pronounced in the dominant left vertebral V4 segment. No significant arterial stenosis in the head or neck. Electronically Signed   By: Genevie Ann M.D.   On: 03/19/2020 11:50   US Abdomen Limited RUQ  Result Date: 03/19/2020 CLINICAL DATA:  Elevated LFTs EXAM: ULTRASOUND ABDOMEN LIMITED RIGHT UPPER QUADRANT COMPARISON:  None. FINDINGS: Gallbladder: Prior cholecystectomy Common bile duct: Diameter: Normal caliber, 5 mm Liver: No focal lesion identified. Within normal limits in parenchymal echogenicity. Portal vein is patent on color Doppler imaging with normal direction of blood flow towards the liver. Other: None. IMPRESSION: No acute findings. Electronically Signed   By: Rolm Baptise M.D.   On: 03/19/2020 23:24    Medications:  Scheduled: . aspirin EC  81 mg Oral Daily  . enoxaparin (LOVENOX) injection  1 mg/kg Subcutaneous Q12H  . ondansetron (ZOFRAN) IV  4 mg Intravenous Once   Continuous:   MRI brain: -- No evidence of acute infarct. -- 11 mm left cerebellopontine angle mass.  Primary differential considerations include a vestibular schwannoma or meningioma, although this finding is incompletely assessed on this noncontrast study. Contrast-enhanced brain MRI with internal auditory canal protocol is recommended for further characterization. -- Mild ethmoid sinus mucosal thickening.  EEG: This study is within normal limits. No seizures or epileptiform discharges were seen throughout the recording.  Echocardiogram: 1. Left ventricular ejection fraction, by estimation, is 70 to 75%. The  left ventricle has hyperdynamic function. The left ventricle has no  regional wall motion abnormalities. Left ventricular diastolic parameters  were normal.  2. Right ventricular systolic function is normal. The right ventricular  size is normal.  3. Left atrial size was mildly dilated.  4. Right atrial size was mildly dilated.  5. The mitral valve is normal in structure. Trivial mitral valve  regurgitation. No evidence of mitral stenosis.  6. The aortic valve is tricuspid. Aortic valve regurgitation is not  visualized. No aortic stenosis is present.  7. Aortic dilatation noted. There is borderline dilatation of the  ascending aorta measuring 37 mm.  8. The inferior vena cava is normal in size with <50% respiratory  variability, suggesting right atrial pressure of 8 mmHg.   Assessment: 64 year old female with no past medical history presenting to the emergency department after an episode of acute LOC while working out, with shockable rhythm detected by AED placed on her at the exercise facility where she collapsed.  This was followed by seizure-like activity at the facility and during EMS transport.  Per staff at the [facility] she was altered for approximately 5 minutes. EMS did note eye deviation and dysp-hasia resembling word salad as well as some shaking.  On arrival to ED patient was somewhat with depressed mentation and appeared postictal, but had no focal deficits.   1. She is  now back to baseline, with a normal Neurological exam.  2. DDx for presentation: Most likely the patient suffered from a syncopal episode secondary to an acute arrhythmia, followed by syncopal seizure.   3. EEG is normal, which militates against epilepsy as the primary etiology for her presentation.   4. MRI brain is negative for stroke. 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  5. Per Cardiology, there are no ischemic changes or arrhythmia on her EKG and telemetry also shows sinus rhythm. She had a negative Troponin at 1008 yesterday, which then became abnormal at 1424 (88) but is now trending down to 23 as of 0647 today. They will consider an ischemic evaluation with cardiac cath or coronary CTA depending on the echo results. If no etiology of the event is found, Cardiology will involve their EP team to discuss an ICD.   Recommendations: 1. Continue Cardiology work up 2. Would obtain an MRI of the cerebellopontine angles with and without contrast to further assess what is most likely to be a left vestibular Schwannoma. She will need to be seen in follow up by Neurosurgery as an outpatient.  3. No indication for an anticonvulsant at this time.  4. Neurology will sign off. Please call if there are additional questions.    LOS: 1 day   @Electronically  signed: Dr. Kerney Elbe 03/20/2020  8:53 AM

## 2020-03-21 ENCOUNTER — Inpatient Hospital Stay (HOSPITAL_COMMUNITY): Payer: BC Managed Care – PPO

## 2020-03-21 ENCOUNTER — Inpatient Hospital Stay (HOSPITAL_COMMUNITY)
Admission: EM | Disposition: A | Discharge status: Home / Self Care | Payer: Self-pay | Source: Home / Self Care | Attending: Internal Medicine

## 2020-03-21 ENCOUNTER — Encounter (HOSPITAL_COMMUNITY)
Admission: EM | Disposition: A | Discharge status: Home / Self Care | Payer: Self-pay | Source: Home / Self Care | Attending: Internal Medicine

## 2020-03-21 DIAGNOSIS — I428 Other cardiomyopathies: Secondary | ICD-10-CM

## 2020-03-21 DIAGNOSIS — I4901 Ventricular fibrillation: Secondary | ICD-10-CM

## 2020-03-21 DIAGNOSIS — I469 Cardiac arrest, cause unspecified: Secondary | ICD-10-CM

## 2020-03-21 HISTORY — PX: ICD IMPLANT: EP1208

## 2020-03-21 HISTORY — PX: LEFT HEART CATH AND CORONARY ANGIOGRAPHY: CATH118249

## 2020-03-21 LAB — BASIC METABOLIC PANEL
Anion gap: 9 (ref 5–15)
BUN: 6 mg/dL — ABNORMAL LOW (ref 8–23)
CO2: 23 mmol/L (ref 22–32)
Calcium: 8.9 mg/dL (ref 8.9–10.3)
Chloride: 108 mmol/L (ref 98–111)
Creatinine, Ser: 0.59 mg/dL (ref 0.44–1.00)
GFR calc Af Amer: >60 mL/min (ref >60)
GFR calc non Af Amer: >60 mL/min (ref >60)
Glucose, Bld: 96 mg/dL (ref 70–99)
Potassium: 3.9 mmol/L (ref 3.5–5.1)
Sodium: 140 mmol/L (ref 135–145)

## 2020-03-21 LAB — SURGICAL PCR SCREEN
MRSA, PCR: NEGATIVE
Staphylococcus aureus: POSITIVE — AB

## 2020-03-21 SURGERY — ICD IMPLANT

## 2020-03-21 SURGERY — LEFT HEART CATH AND CORONARY ANGIOGRAPHY
Anesthesia: LOCAL

## 2020-03-21 MED ORDER — HEPARIN SODIUM (PORCINE) 1000 UNIT/ML IJ SOLN
INTRAMUSCULAR | Status: AC
  Filled 2020-03-21: qty 1

## 2020-03-21 MED ORDER — HEPARIN (PORCINE) IN NACL 1000-0.9 UT/500ML-% IV SOLN
INTRAVENOUS | Status: DC | PRN
  Administered 2020-03-21: 500 mL

## 2020-03-21 MED ORDER — VERAPAMIL HCL 2.5 MG/ML IV SOLN: INTRAVENOUS | Status: DC | PRN

## 2020-03-21 MED ORDER — LIDOCAINE HCL (PF) 1 % IJ SOLN
INTRAMUSCULAR | Status: DC | PRN
  Administered 2020-03-21: 2 mL

## 2020-03-21 MED ORDER — ONDANSETRON HCL 4 MG/2ML IJ SOLN
4.0000 mg | Freq: Four times a day (QID) | INTRAMUSCULAR | Status: DC | PRN
  Administered 2020-03-21: 4 mg via INTRAVENOUS

## 2020-03-21 MED ORDER — CEFAZOLIN SODIUM-DEXTROSE 1-4 GM/50ML-% IV SOLN
1.0000 g | Freq: Four times a day (QID) | INTRAVENOUS | Status: AC
  Administered 2020-03-21 – 2020-03-22 (×3): 1 g via INTRAVENOUS
  Filled 2020-03-21 (×3): qty 50

## 2020-03-21 MED ORDER — HEPARIN (PORCINE) IN NACL 1000-0.9 UT/500ML-% IV SOLN
INTRAVENOUS | Status: AC
  Filled 2020-03-21: qty 500

## 2020-03-21 MED ORDER — CHLORHEXIDINE GLUCONATE CLOTH 2 % EX PADS: 6.0000 | MEDICATED_PAD | Freq: Every day | CUTANEOUS | Status: DC

## 2020-03-21 MED ORDER — HEPARIN SODIUM (PORCINE) 1000 UNIT/ML IJ SOLN
INTRAMUSCULAR | Status: DC | PRN
  Administered 2020-03-21: 3000 [IU] via INTRAVENOUS

## 2020-03-21 MED ORDER — LIDOCAINE HCL (PF) 1 % IJ SOLN
INTRAMUSCULAR | Status: AC
  Filled 2020-03-21: qty 30

## 2020-03-21 MED ORDER — ACETAMINOPHEN 325 MG PO TABS
650.0000 mg | ORAL_TABLET | ORAL | Status: DC | PRN
  Administered 2020-03-21: 650 mg via ORAL

## 2020-03-21 MED ORDER — CHLORHEXIDINE GLUCONATE 4 % EX LIQD: 60.0000 mL | Freq: Once | CUTANEOUS | Status: DC

## 2020-03-21 MED ORDER — CEFAZOLIN SODIUM-DEXTROSE 2-4 GM/100ML-% IV SOLN
2.0000 g | INTRAVENOUS | Status: AC
  Administered 2020-03-21: 2 g via INTRAVENOUS
  Filled 2020-03-21: qty 100

## 2020-03-21 MED ORDER — MIDAZOLAM HCL 5 MG/5ML IJ SOLN
INTRAMUSCULAR | Status: AC
  Filled 2020-03-21: qty 5

## 2020-03-21 MED ORDER — IOHEXOL 350 MG/ML SOLN
INTRAVENOUS | Status: DC | PRN
  Administered 2020-03-21: 50 mL via INTRA_ARTERIAL

## 2020-03-21 MED ORDER — MIDAZOLAM HCL 2 MG/2ML IJ SOLN
INTRAMUSCULAR | Status: AC
  Filled 2020-03-21: qty 2

## 2020-03-21 MED ORDER — SODIUM CHLORIDE 0.9% FLUSH: 3.0000 mL | INTRAVENOUS | Status: DC | PRN

## 2020-03-21 MED ORDER — MIDAZOLAM HCL 5 MG/5ML IJ SOLN
INTRAMUSCULAR | Status: DC | PRN
  Administered 2020-03-21: 1 mg via INTRAVENOUS
  Administered 2020-03-21 (×3): 2 mg via INTRAVENOUS

## 2020-03-21 MED ORDER — FENTANYL CITRATE (PF) 100 MCG/2ML IJ SOLN
INTRAMUSCULAR | Status: AC
  Filled 2020-03-21: qty 2

## 2020-03-21 MED ORDER — ONDANSETRON HCL 4 MG/2ML IJ SOLN
4.0000 mg | Freq: Four times a day (QID) | INTRAMUSCULAR | Status: DC | PRN
  Filled 2020-03-21: qty 2

## 2020-03-21 MED ORDER — LIDOCAINE HCL 1 % IJ SOLN
INTRAMUSCULAR | Status: AC
  Filled 2020-03-21: qty 60

## 2020-03-21 MED ORDER — HYDRALAZINE HCL 20 MG/ML IJ SOLN: 10.0000 mg | INTRAMUSCULAR | Status: AC | PRN

## 2020-03-21 MED ORDER — LIDOCAINE HCL (PF) 1 % IJ SOLN
INTRAMUSCULAR | Status: DC | PRN
  Administered 2020-03-21: 60 mL

## 2020-03-21 MED ORDER — SODIUM CHLORIDE 0.9 % IV SOLN: INTRAVENOUS | Status: AC

## 2020-03-21 MED ORDER — HEPARIN (PORCINE) IN NACL 1000-0.9 UT/500ML-% IV SOLN
INTRAVENOUS | Status: DC | PRN
  Administered 2020-03-21 (×2): 500 mL

## 2020-03-21 MED ORDER — MUPIROCIN 2 % EX OINT
1.0000 "application " | TOPICAL_OINTMENT | Freq: Two times a day (BID) | CUTANEOUS | Status: DC
  Administered 2020-03-21 – 2020-03-22 (×2): 1 via NASAL
  Filled 2020-03-21: qty 22

## 2020-03-21 MED ORDER — SODIUM CHLORIDE 0.9 % IV SOLN
80.0000 mg | INTRAVENOUS | Status: AC
  Administered 2020-03-21: 80 mg
  Filled 2020-03-21: qty 2

## 2020-03-21 MED ORDER — VERAPAMIL HCL 2.5 MG/ML IV SOLN
INTRAVENOUS | Status: AC
  Filled 2020-03-21: qty 2

## 2020-03-21 MED ORDER — MIDAZOLAM HCL 2 MG/2ML IJ SOLN
INTRAMUSCULAR | Status: DC | PRN
  Administered 2020-03-21: 2 mg via INTRAVENOUS
  Administered 2020-03-21: 1 mg via INTRAVENOUS

## 2020-03-21 MED ORDER — LORAZEPAM 2 MG/ML IJ SOLN: 1.0000 mg | Freq: Once | INTRAMUSCULAR | Status: DC

## 2020-03-21 MED ORDER — FENTANYL CITRATE (PF) 100 MCG/2ML IJ SOLN
INTRAMUSCULAR | Status: DC | PRN
  Administered 2020-03-21: 50 ug via INTRAVENOUS

## 2020-03-21 MED ORDER — SODIUM CHLORIDE 0.9 % IV SOLN: 250.0000 mL | INTRAVENOUS | Status: DC

## 2020-03-21 MED ORDER — SODIUM CHLORIDE 0.9% FLUSH
3.0000 mL | Freq: Two times a day (BID) | INTRAVENOUS | Status: DC
  Administered 2020-03-21: 3 mL via INTRAVENOUS

## 2020-03-21 MED ORDER — GADOBUTROL 1 MMOL/ML IV SOLN
7.0000 mL | Freq: Once | INTRAVENOUS | Status: AC | PRN
  Administered 2020-03-21: 7 mL via INTRAVENOUS

## 2020-03-21 MED ORDER — SODIUM CHLORIDE 0.9% FLUSH: 3.0000 mL | Freq: Two times a day (BID) | INTRAVENOUS | Status: DC

## 2020-03-21 MED ORDER — LABETALOL HCL 5 MG/ML IV SOLN: 10.0000 mg | INTRAVENOUS | Status: AC | PRN

## 2020-03-21 MED ORDER — SODIUM CHLORIDE 0.9 % IV SOLN: 250.0000 mL | INTRAVENOUS | Status: DC | PRN

## 2020-03-21 MED ORDER — ACETAMINOPHEN 325 MG PO TABS: 325.0000 mg | ORAL_TABLET | ORAL | Status: DC | PRN

## 2020-03-21 MED ORDER — SODIUM CHLORIDE 0.9 % IV SOLN: INTRAVENOUS | Status: DC

## 2020-03-21 MED ORDER — FENTANYL CITRATE (PF) 100 MCG/2ML IJ SOLN
INTRAMUSCULAR | Status: DC | PRN
  Administered 2020-03-21 (×2): 25 ug via INTRAVENOUS
  Administered 2020-03-21 (×2): 12.5 ug via INTRAVENOUS

## 2020-03-21 SURGICAL SUPPLY — 11 items
CATH INFINITI 5 FR JL3.5 (CATHETERS) ×1 IMPLANT
CATH OPTITORQUE TIG 4.0 5F (CATHETERS) ×1 IMPLANT
DEVICE RAD COMP TR BAND LRG (VASCULAR PRODUCTS) ×1 IMPLANT
ELECT DEFIB PAD ADLT CADENCE (PAD) ×1 IMPLANT
GLIDESHEATH SLEND SS 6F .021 (SHEATH) ×1 IMPLANT
GUIDEWIRE INQWIRE 1.5J.035X260 (WIRE) IMPLANT
INQWIRE 1.5J .035X260CM (WIRE) ×2
KIT HEART LEFT (KITS) ×2 IMPLANT
PACK CARDIAC CATHETERIZATION (CUSTOM PROCEDURE TRAY) ×2 IMPLANT
TRANSDUCER W/STOPCOCK (MISCELLANEOUS) ×2 IMPLANT
TUBING CIL FLEX 10 FLL-RA (TUBING) ×2 IMPLANT

## 2020-03-21 SURGICAL SUPPLY — 6 items
CABLE SURGICAL S-101-97-12 (CABLE) ×2 IMPLANT
ICD GALLANT VR CDVRA500Q (ICD Generator) ×1 IMPLANT
LEAD DURATA 7122Q-58CM (Lead) ×1 IMPLANT
PAD PRO RADIOLUCENT 2001M-C (PAD) ×2 IMPLANT
SHEATH 7FR PRELUDE SNAP 13 (SHEATH) ×1 IMPLANT
TRAY PACEMAKER INSERTION (PACKS) ×2 IMPLANT

## 2020-03-21 NOTE — Progress Notes (Addendum)
Progress Note  [name] Date of Encounter: 03/21/2020  Primary Cardiologist: new to [facility], Dr. [name]  Subjective   No complaints  Inpatient Medications    Scheduled Meds: . aspirin EC  81 mg Oral Daily  . enoxaparin (LOVENOX) injection  40 mg Subcutaneous Q24H  . ondansetron (ZOFRAN) IV  4 mg Intravenous Once  . sodium chloride flush  3 mL Intravenous Q12H  . sodium chloride flush  3 mL Intravenous Q12H   Continuous Infusions: . sodium chloride 100 mL/hr at 03/20/20 1937  . sodium chloride    . sodium chloride 1 mL/kg/hr (03/21/20 0458)   PRN Meds: sodium chloride, acetaminophen **OR** acetaminophen, diphenhydrAMINE, sodium chloride flush   Vital Signs    Vitals:   03/20/20 0949 03/20/20 1343 03/20/20 2018 03/21/20 0612  BP: 112/73 107/68 119/81 126/76  Pulse: 80 78 74 83  Resp: 16 16 18 20   Temp: 98.3 F (36.8 C) 98.4 F (36.9 C) 98.2 F (36.8 C) 98 F (36.7 C)  TempSrc: Oral Oral Oral Oral  SpO2: 97% 96% 98% 97%  Weight:    62 kg  Height:        Intake/Output Summary (Last 24 hours) at 03/21/2020 0809 Last data filed at 03/20/2020 1500 Gross per 24 hour  Intake 139.73 ml  Output --  Net 139.73 ml   Last 3 Weights 03/21/2020 03/20/2020 03/19/2020  Weight (lbs) 136 lb 11.2 oz 135 lb 4.8 oz 135 lb 12.8 oz  Weight (kg) 62.007 kg 61.372 kg 61.598 kg      Telemetry    SR - Personally Reviewed  ECG    No new EKGs - Personally Reviewed  Physical Exam   GEN: No acute distress.   Neck: No JVD Cardiac: RRR, no murmurs, rubs, or gallops.  Respiratory: CTA b/l. GI: Soft, nontender, non-distended  MS: No edema; No deformity. Neuro:  Nonfocal  Psych: Normal affect   Labs    High Sensitivity Troponin:   Recent Labs  Lab 03/19/20 1008 03/19/20 1424 03/20/20 0647  TROPONINIHS 8 88* 23*      Chemistry Recent Labs  Lab 03/19/20 1008 03/19/20 1015 03/20/20 0647  NA 139 139 140  K 3.3* 3.3* 3.9  CL 103 103 106  CO2 20*  --  27   GLUCOSE 214* 209* 96  BUN 10 10 7*  CREATININE 0.86 0.60 0.72  CALCIUM 9.4  --  9.1  PROT 6.4*  --  5.8*  ALBUMIN 3.9  --  3.6  AST 573*  --  258*  ALT 631*  --  433*  ALKPHOS 83  --  82  BILITOT 0.7  --  0.8  GFRNONAA >60  --  >60  GFRAA >60  --  >60  ANIONGAP 16*  --  7     Hematology Recent Labs  Lab 03/19/20 1008 03/19/20 1015 03/20/20 0647  WBC 11.5*  --  5.5  RBC 4.71  --  4.46  HGB 14.0 13.6 13.0  HCT 41.4 40.0 40.0  MCV 87.9  --  89.7  MCH 29.7  --  29.1  MCHC 33.8  --  32.5  RDW 12.4  --  12.6  PLT 256  --  195    BNPNo results for input(s): BNP, PROBNP in the last 168 hours.   DDimer  Recent Labs  Lab 03/19/20 1424  DDIMER 10.36*     Radiology      Cardiac Studies   03/19/2020: TTE IMPRESSIONS  1.  Left ventricular ejection fraction, by estimation, is 70 to 75%. The  left ventricle has hyperdynamic function. The left ventricle has no  regional wall motion abnormalities. Left ventricular diastolic parameters  were normal.  2. Right ventricular systolic function is normal. The right ventricular  size is normal.  3. Left atrial size was mildly dilated.  4. Right atrial size was mildly dilated.  5. The mitral valve is normal in structure. Trivial mitral valve  regurgitation. No evidence of mitral stenosis.  6. The aortic valve is tricuspid. Aortic valve regurgitation is not  visualized. No aortic stenosis is present.  7. Aortic dilatation noted. There is borderline dilatation of the  ascending aorta measuring 37 mm.  8. The inferior vena cava is normal in size with <50% respiratory  variability, suggesting right atrial pressure of 8 mmHg.   Patient Profile     64 y.o. female with no notable PMHx admitted after OOH cardiac arrest  Assessment & Plan    1. Cardiac arrest     AED advised and gave shock presumed VF/VT  No known medical history, one prior syncope associated with sight of blood Unremarkable EKG, echo Pending cath and  c.MRI No family hx of SCD  Dr. [name] has seen and discussed rational for ICD, discussed the procedure, potential risks.  She is agreeable to proceed if no significant CAD found on cath  Plan for ICD pending cath findings, hopefully c/MRI can get done this AM prior to her cath.  Cath lab has reached out to MRI department  Neurology has ordered f/u MRI to further assess what is most likely to be a left vestibular Schwannoma, they have signed off with recommendations for outpt follow up.  MRI department states they d/w radiologist and can be an abbreviated study and hopefully will be able to get done at the time of her c.MRI if pt able to tolerate both  We discussed no driving yesterday  For questions or updates, please contact [place] Please consult [email or web address] for contact info under   Signed, Baldwin Jamaica, PA-C  03/21/2020, 8:09 AM    EP Attending  Patient seen and examined. Agree with above. The patient presents with a cardiac arrest, presumed due to VF. She has a cardiac MRI and left heart cath pending. If no reversible causes, she will undergo ICD insertion later today. I have discussed the indications/risks/benefits/goals/expectations of ICD insertion and she wishes to proceed.  Mikle Bosworth.D.

## 2020-03-21 NOTE — Interval H&P Note (Signed)
Cath Lab Visit (complete for each Cath Lab visit)  Clinical Evaluation Leading to the Procedure:   ACS: No.  Non-ACS:    Anginal Classification: No Symptoms  Anti-ischemic medical therapy: No Therapy  Non-Invasive Test Results: No non-invasive testing performed  Prior CABG: No previous CABG      History and Physical Interval Note:  03/21/2020 2:01 PM  Allison Cannon  has presented today for surgery, with the diagnosis of post cardiac arrest.  The various methods of treatment have been discussed with the patient and family. After consideration of risks, benefits and other options for treatment, the patient has consented to  Procedure(s): LEFT HEART CATH AND CORONARY ANGIOGRAPHY (N/A) as a surgical intervention.  The patient's history has been reviewed, patient examined, no change in status, stable for surgery.  I have reviewed the patient's chart and labs.  Questions were answered to the patient's satisfaction.     Shelva Majestic

## 2020-03-21 NOTE — Progress Notes (Signed)
PROGRESS NOTE    Allison Cannon  [id number] DOB: 27-Jun-1956 DOA: 03/19/2020 PCP: Patient, No Pcp Per   Brief Narrative: 64 year old with no significant past medical history other than chronic left leg pain secondary to piriformis muscle compression, presents after a cardiopulmonary arrest and CPR.  Patient has been doing outpatient PT and workout class at the [facility] for leg pain.  At 8:30 AM, just before regular yoga class, patient suddenly collapsed without any prodromes.  Downtime was minimal.  CPR restarted, at one point AED showed shockable rhythm, patient had 1 shock delivered.  CPR was continued.  Patient will regain circulation.  On EMS arrival patient was noted to have a normal rhythm.  On arrival to the ED patient was noted to have a left gaze deviation and a slurry speech. Evaluation in the ED EKG with no significant changes, EEG negative for seizure.  Low magnesium and low potassium level.   Assessment & Plan:   Active Problems:   Cardiac arrest [facility])   LFT elevation   1-Cardiac Arrest, S/P CPR and shock by AED. Presume VF CT angio negative for PE  ECHO ordered. EF 70-75 % Plan for cath today. Evaluated by EP, if not significant abnormalities found on cath, plan to proceed with ICD.   2-Elevation of D dimer;  CT angio negative for PE>  Doppler ordered. Pending  3-Hypokalemia/Hypomagnesemia;  Replaced.   Transaminases Related to  Liver hypoperfusion.  LFT trending down  [place] negative.   Seizure like activity post cardiac arrest;  Evaluated by neurology CT head no acute abnormalities  EEG no evidence of seizure.  MRI brain: 11 mm left cerebellopontine angle mass, vestibular schwannoma or meningioma.  Follow neurology recommendation.   11 mm left cerebellopontine angle mass: meningioma vs Vestibular schwannoma. : plan to get MRI with contrast.  Neurology will discussed results with patient, when MRI results available.       Estimated body mass index is 21.41  kg/m as calculated from the following:   Height as of this encounter: 5\' 7"  (1.702 m).   Weight as of this encounter: 62 kg.   DVT prophylaxis: lovenox Code Status: Full code Family Communication: Husband was at bedside Disposition Plan:  Status is: Inpatient  Remains inpatient appropriate because:Ongoing diagnostic testing needed not appropriate for outpatient work up. Plan for Cath today, and possible ICD placement.    Dispo: The patient is from: Home              Anticipated d/c is to: Home              Anticipated d/c date is: 1 day              Patient currently is not medically stable to d/c.         Consultants:   Cardiology   Procedures:   Doppler  Antimicrobials:    Subjective: Alert, I informed her results of MRI from yesterday.   Objective: Vitals:   03/20/20 0949 03/20/20 1343 03/20/20 2018 03/21/20 0612  BP: 112/73 107/68 119/81 126/76  Pulse: 80 78 74 83  Resp: 16 16 18 20   Temp: 98.3 F (36.8 C) 98.4 F (36.9 C) 98.2 F (36.8 C) 98 F (36.7 C)  TempSrc: Oral Oral Oral Oral  SpO2: 97% 96% 98% 97%  Weight:    62 kg  Height:        Intake/Output Summary (Last 24 hours) at 03/21/2020 1254 Last data filed at 03/20/2020 1500 Gross per 24 hour  Intake 139.73 ml  Output --  Net 139.73 ml   Filed Weights   03/19/20 1652 03/20/20 0342 03/21/20 0612  Weight: 61.6 kg 61.4 kg 62 kg    Examination:  General exam: NAD Respiratory system: CTA Cardiovascular system: S 1, S 2 RRR Gastrointestinal system: BS present, soft, nt Central nervous system; alert, conversant Extremities: no edema   Data Reviewed: I have personally reviewed following labs and imaging studies  CBC: Recent Labs  Lab 03/19/20 1008 03/19/20 1015 03/20/20 0647  WBC 11.5*  --  5.5  NEUTROABS 7.5  --   --   HGB 14.0 13.6 13.0  HCT 41.4 40.0 40.0  MCV 87.9  --  89.7  PLT 256  --  0000000   Basic Metabolic Panel: Recent Labs  Lab 03/19/20 1008 03/19/20 1015  03/19/20 1424 03/20/20 0647 03/21/20 0815  NA 139 139  --  140 140  K 3.3* 3.3*  --  3.9 3.9  CL 103 103  --  106 108  CO2 20*  --   --  27 23  GLUCOSE 214* 209*  --  96 96  BUN 10 10  --  7* 6*  CREATININE 0.86 0.60  --  0.72 0.59  CALCIUM 9.4  --   --  9.1 8.9  MG  --   --  1.6* 1.9  --    GFR: Estimated Creatinine Clearance: 69.1 mL/min (by C-G formula based on SCr of 0.59 mg/dL). Liver Function Tests: Recent Labs  Lab 03/19/20 1008 03/20/20 0647  AST 573* 258*  ALT 631* 433*  ALKPHOS 83 82  BILITOT 0.7 0.8  PROT 6.4* 5.8*  ALBUMIN 3.9 3.6   No results for input(s): LIPASE, AMYLASE in the last 168 hours. No results for input(s): AMMONIA in the last 168 hours. Coagulation Profile: Recent Labs  Lab 03/19/20 1008  INR 1.0   Cardiac Enzymes: Recent Labs  Lab 03/19/20 1639  CKTOTAL 82   BNP (last 3 results) No results for input(s): PROBNP in the last 8760 hours. HbA1C: Recent Labs    03/19/20 1639  HGBA1C 5.5   CBG: No results for input(s): GLUCAP in the last 168 hours. Lipid Profile: No results for input(s): CHOL, HDL, LDLCALC, TRIG, CHOLHDL, LDLDIRECT in the last 72 hours. Thyroid Function Tests: Recent Labs    03/19/20 1424  TSH 1.195  FREET4 1.12   Anemia Panel: No results for input(s): VITAMINB12, FOLATE, FERRITIN, TIBC, IRON, RETICCTPCT in the last 72 hours. Sepsis Labs: No results for input(s): PROCALCITON, LATICACIDVEN in the last 168 hours.  Recent Results (from the past 240 hour(s))  SARS Coronavirus 2 by RT PCR (hospital order, performed in [facility] hospital lab) Nasopharyngeal Nasopharyngeal Swab     Status: None   Collection Time: 03/19/20 12:03 PM   Specimen: Nasopharyngeal Swab  Result Value Ref Range Status   SARS Coronavirus 2 NEGATIVE NEGATIVE Final    Comment: (NOTE) SARS-CoV-2 target nucleic acids are NOT DETECTED. The SARS-CoV-2 RNA is generally detectable in upper and lower respiratory specimens during the acute phase  of infection. The lowest concentration of SARS-CoV-2 viral copies this assay can detect is 250 copies / mL. A negative result does not preclude SARS-CoV-2 infection and should not be used as the sole basis for treatment or other patient management decisions.  A negative result may occur with improper specimen collection / handling, submission of specimen other than nasopharyngeal swab, presence of viral mutation(s) within the areas targeted by this assay, and  inadequate number of viral copies (<250 copies / mL). A negative result must be combined with clinical observations, patient history, and epidemiological information. Fact Sheet for Patients:   StrictlyIdeas.no Fact Sheet for Healthcare Providers: BankingDealers.co.za This test is not yet approved or cleared  by the [place] FDA and has been authorized for detection and/or diagnosis of SARS-CoV-2 by FDA under an Emergency Use Authorization (EUA).  This EUA will remain in effect (meaning this test can be used) for the duration of the COVID-19 declaration under Section 564(b)(1) of the Act, 21 U.S.C. section 360bbb-3(b)(1), unless the authorization is terminated or revoked sooner. Performed at [facility], [place] [address], [place], [place] [address]   Surgical PCR screen     Status: Abnormal   Collection Time: 03/21/20  8:50 AM   Specimen: Nasal Mucosa; Nasal Swab  Result Value Ref Range Status   MRSA, PCR NEGATIVE NEGATIVE Final   Staphylococcus aureus POSITIVE (A) NEGATIVE Final    Comment: (NOTE) The Xpert SA Assay (FDA approved for NASAL specimens in patients 42 years of age and older), is one component of a comprehensive surveillance program. It is not intended to diagnose infection nor to guide or monitor treatment. Performed at [facility], [place] [address], [place], [place] [address]          Radiology Studies: EEG  Result Date: 03/19/2020 Lora Havens, MD     03/19/2020  4:07 PM [name] MRN: [id number] Epilepsy Attending: Lora Havens Referring Physician/Provider: Etta Quill, PA Date: 03/19/2020 Duration: 24.15 mins Patient history:  64 year old female with no past medical history presenting to the emergency department after a syncopal episode followed by seizure-like jerking that lasted briefly. EEG to evaluate for seizure Level of alertness: Awake, asleep AEDs during EEG study: None Technical aspects: This EEG study was done with scalp electrodes positioned according to the 10-20 International system of electrode placement. Electrical activity was acquired at a sampling rate of 500Hz  and reviewed with a high frequency filter of 70Hz  and a low frequency filter of 1Hz . EEG data were recorded continuously and digitally stored. Description: The posterior dominant rhythm consists of 8.5-9 Hz activity of moderate voltage (25-35 uV) seen predominantly in posterior head regions, symmetric and reactive to eye opening and eye closing. Sleep was characterized by vertex waves, maximal frontocentral region.   Hyperventilation and photic stimulation were not performed.   IMPRESSION: This study is within normal limits. No seizures or epileptiform discharges were seen throughout the recording. Priyanka Barbra Sarks   CT ANGIO CHEST PE W OR WO CONTRAST  Result Date: 03/20/2020 CLINICAL DATA:  Cardiac arrest. EXAM: CT ANGIOGRAPHY CHEST WITH CONTRAST TECHNIQUE: Multidetector CT imaging of the chest was performed using the standard protocol during bolus administration of intravenous contrast. Multiplanar CT image reconstructions and MIPs were obtained to evaluate the vascular anatomy. CONTRAST:  3mL OMNIPAQUE IOHEXOL 350 MG/ML SOLN COMPARISON:  Chest x-ray dated 03/19/2020 FINDINGS: Cardiovascular: Satisfactory opacification of the pulmonary arteries to the segmental level. No evidence of pulmonary embolism. Normal heart size. No pericardial effusion.  Mediastinum/Nodes: No enlarged mediastinal, hilar, or axillary lymph nodes. Thyroid gland, trachea, and esophagus demonstrate no significant findings. Lungs/Pleura: Slight atelectasis at the lung bases posteriorly. Otherwise normal. No effusions. Upper Abdomen: Normal. Musculoskeletal: Normal. Review of the MIP images confirms the above findings. IMPRESSION: No pulmonary emboli. Slight atelectasis at the lung bases posteriorly. Otherwise, normal exam. Electronically Signed   By: Dina Rich.D.  On: 03/20/2020 11:59   MR BRAIN WO CONTRAST  Result Date: 03/19/2020 CLINICAL DATA:  Stroke, follow-up. EXAM: MRI HEAD WITHOUT CONTRAST TECHNIQUE: Multiplanar, multiecho pulse sequences of the brain and surrounding structures were obtained without intravenous contrast. COMPARISON:  Noncontrast head CT and CT angiogram head/neck performed earlier the same day 03/19/2020. 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 Elsewhere, there is no focal parenchymal signal abnormality within the brain. There is no acute infarct. No chronic intracranial blood products. No extra-axial fluid collection. No midline shift. Vascular: Expected proximal arterial flow voids. Skull and upper cervical spine: No focal marrow lesion. Sinuses/Orbits: Visualized orbits show no acute finding. Minimal ethmoid sinus mucosal thickening. No significant mastoid effusion. IMPRESSION: No evidence of acute infarct. 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 Primary differential considerations include a vestibular schwannoma or meningioma, although this finding is incompletely assessed on this noncontrast study. Contrast-enhanced brain MRI with internal auditory canal protocol is recommended for further characterization. Otherwise unremarkable MRI appearance of the brain. Mild ethmoid sinus mucosal thickening.  Electronically Signed   By: Kellie Simmering DO   On: 03/19/2020 14:08   ECHOCARDIOGRAM COMPLETE  Result Date: 03/19/2020    ECHOCARDIOGRAM REPORT   Patient Name:   Allison Cannon Date of Exam: 03/19/2020 Medical Rec #:  [id number]    Height:       67.0 in Accession #:    [id number]   Weight:       135.0 lb Date of Birth:  05-31-1956   BSA:          68.711 m Patient Age:    89 years     BP:           96/59 mmHg Patient Gender: F            HR:           95 bpm. Exam Location:  Inpatient Procedure: 2D Echo, Color Doppler and Cardiac Doppler Indications:    Cardiac Arrest  History:        Patient has no prior history of Echocardiogram examinations.  Sonographer:    Raquel Sarna Senior RDCS Referring Phys: [place]  1. Left ventricular ejection fraction, by estimation, is 70 to 75%. The left ventricle has hyperdynamic function. The left ventricle has no regional wall motion abnormalities. Left ventricular diastolic parameters were normal.  2. Right ventricular systolic function is normal. The right ventricular size is normal.  3. Left atrial size was mildly dilated.  4. Right atrial size was mildly dilated.  5. The mitral valve is normal in structure. Trivial mitral valve regurgitation. No evidence of mitral stenosis.  6. The aortic valve is tricuspid. Aortic valve regurgitation is not visualized. No aortic stenosis is present.  7. Aortic dilatation noted. There is borderline dilatation of the ascending aorta measuring 37 mm.  8. The inferior vena cava is normal in size with <50% respiratory variability, suggesting right atrial pressure of 8 mmHg. Comparison(s): No prior Echocardiogram. Conclusion(s)/Recommendation(s): Normal biventricular function without evidence of hemodynamically significant valvular heart disease. FINDINGS  Left Ventricle: Left ventricular ejection fraction, by estimation, is 70 to 75%. The left ventricle has hyperdynamic function. The left ventricle has no regional wall motion  abnormalities. The left ventricular internal cavity size was normal in size. There is no left ventricular hypertrophy. Left ventricular diastolic parameters were normal. Right Ventricle: The right ventricular  size is normal. No increase in right ventricular wall thickness. Right ventricular systolic function is normal. Left Atrium: Left atrial size was mildly dilated. Right Atrium: Right atrial size was mildly dilated. Pericardium: Trivial pericardial effusion is present. Mitral Valve: The mitral valve is normal in structure. Trivial mitral valve regurgitation. No evidence of mitral valve stenosis. Tricuspid Valve: The tricuspid valve is normal in structure. Tricuspid valve regurgitation is trivial. No evidence of tricuspid stenosis. Aortic Valve: The aortic valve is tricuspid. Aortic valve regurgitation is not visualized. No aortic stenosis is present. Pulmonic Valve: The pulmonic valve was grossly normal. Pulmonic valve regurgitation is not visualized. Aorta: Aortic dilatation noted. There is borderline dilatation of the ascending aorta measuring 37 mm. Venous: The inferior vena cava is normal in size with less than 50% respiratory variability, suggesting right atrial pressure of 8 mmHg. IAS/Shunts: No atrial level shunt detected by color flow Doppler. Additional Comments: There is a small pleural effusion in both left and right lateral regions.  LEFT VENTRICLE PLAX 2D LVIDd:         4.60 cm  Diastology LVIDs:         2.50 cm  LV e' lateral:   14.40 cm/s LV PW:         0.90 cm  LV E/e' lateral: 4.2 LV IVS:        1.00 cm  LV e' medial:    9.79 cm/s LVOT diam:     1.90 cm  LV E/e' medial:  6.1 LV SV:         61 LV SV Index:   36 LVOT Area:     2.84 cm  RIGHT VENTRICLE RV S prime:     14.90 cm/s TAPSE (M-mode): 2.9 cm LEFT ATRIUM           Index       RIGHT ATRIUM           Index LA diam:      2.50 cm 1.46 cm/m  RA Area:     19.00 cm LA Vol (A2C): 52.3 ml 30.56 ml/m RA Volume:   51.70 ml  30.21 ml/m LA Vol  (A4C): 46.4 ml 27.12 ml/m  AORTIC VALVE LVOT Vmax:   119.00 cm/s LVOT Vmean:  79.200 cm/s LVOT VTI:    0.215 m  AORTA Ao Root diam: 3.20 cm Ao Asc diam:  3.70 cm MITRAL VALVE MV Area (PHT): 2.43 cm    SHUNTS MV Decel Time: 312 msec    Systemic VTI:  0.22 m MV E velocity: 60.00 cm/s  Systemic Diam: 1.90 cm MV A velocity: 57.40 cm/s MV E/A ratio:  1.05 Buford Dresser MD Electronically signed by Buford Dresser MD Signature Date/Time: 03/19/2020/5:23:57 PM    Final    US Abdomen Limited RUQ  Result Date: 03/19/2020 CLINICAL DATA:  Elevated LFTs EXAM: ULTRASOUND ABDOMEN LIMITED RIGHT UPPER QUADRANT COMPARISON:  None. FINDINGS: Gallbladder: Prior cholecystectomy Common bile duct: Diameter: Normal caliber, 5 mm Liver: No focal lesion identified. Within normal limits in parenchymal echogenicity. Portal vein is patent on color Doppler imaging with normal direction of blood flow towards the liver. Other: None. IMPRESSION: No acute findings. Electronically Signed   By: Rolm Baptise M.D.   On: 03/19/2020 23:24        Scheduled Meds:  aspirin EC  81 mg Oral Daily   chlorhexidine  60 mL Topical Once   chlorhexidine  60 mL Topical Once   enoxaparin (LOVENOX) injection  40 mg Subcutaneous Q24H  gentamicin irrigation  80 mg Irrigation On Call   LORazepam  1 mg Intravenous Once   ondansetron (ZOFRAN) IV  4 mg Intravenous Once   sodium chloride flush  3 mL Intravenous Q12H   sodium chloride flush  3 mL Intravenous Q12H   Continuous Infusions:  sodium chloride 100 mL/hr at 03/20/20 1937   sodium chloride     sodium chloride     sodium chloride     sodium chloride 1 mL/kg/hr (03/21/20 1056)    ceFAZolin (ANCEF) IV       LOS: 2 days    Time spent: 35 minutes.    Elmarie Shiley, MD [facility]   If 7PM-7AM, please contact night-coverage [email or web address]  03/21/2020, 12:54 PM

## 2020-03-21 NOTE — H&P (View-Only) (Signed)
Progress Note  [name] Date of Encounter: 03/21/2020  Primary Cardiologist: [name] (new)  Subjective   No chest pain or dyspnea. No events overnight.   Inpatient Medications    Scheduled Meds: . aspirin EC  81 mg Oral Daily  . enoxaparin (LOVENOX) injection  40 mg Subcutaneous Q24H  . ondansetron (ZOFRAN) IV  4 mg Intravenous Once  . sodium chloride flush  3 mL Intravenous Q12H   Continuous Infusions: . sodium chloride 100 mL/hr at 03/20/20 1937  . sodium chloride    . sodium chloride 1 mL/kg/hr (03/21/20 0458)   PRN Meds: sodium chloride, acetaminophen **OR** acetaminophen, diphenhydrAMINE, sodium chloride flush   Vital Signs    Vitals:   03/20/20 0949 03/20/20 1343 03/20/20 2018 03/21/20 0612  BP: 112/73 107/68 119/81 126/76  Pulse: 80 78 74 83  Resp: 16 16 18 20   Temp: 98.3 F (36.8 C) 98.4 F (36.9 C) 98.2 F (36.8 C) 98 F (36.7 C)  TempSrc: Oral Oral Oral Oral  SpO2: 97% 96% 98% 97%  Weight:    62 kg  Height:        Intake/Output Summary (Last 24 hours) at 03/21/2020 0802 Last data filed at 03/20/2020 1500 Gross per 24 hour  Intake 139.73 ml  Output --  Net 139.73 ml   Last 3 Weights 03/21/2020 03/20/2020 03/19/2020  Weight (lbs) 136 lb 11.2 oz 135 lb 4.8 oz 135 lb 12.8 oz  Weight (kg) 62.007 kg 61.372 kg 61.598 kg      Telemetry    sinus- Personally Reviewed  ECG    No AM EKG - Personally Reviewed  Physical Exam   General: Well developed, well nourished, NAD  HEENT: OP clear, mucus membranes moist  SKIN: warm, dry. No rashes. Neuro: No focal deficits  Musculoskeletal: Muscle strength 5/5 all ext  Psychiatric: Mood and affect normal  Neck: No JVD, no carotid bruits, no thyromegaly, no lymphadenopathy.  Lungs:Clear bilaterally, no wheezes, rhonci, crackles Cardiovascular: Regular rate and rhythm. No murmurs, gallops or rubs. Abdomen:Soft. Bowel sounds present. Non-tender.  Extremities: No lower extremity edema. Pulses are  2 + in the bilateral DP/PT.   Labs    High Sensitivity Troponin:   Recent Labs  Lab 03/19/20 1008 03/19/20 1424 03/20/20 0647  TROPONINIHS 8 88* 23*      Chemistry Recent Labs  Lab 03/19/20 1008 03/19/20 1015 03/20/20 0647  NA 139 139 140  K 3.3* 3.3* 3.9  CL 103 103 106  CO2 20*  --  27  GLUCOSE 214* 209* 96  BUN 10 10 7*  CREATININE 0.86 0.60 0.72  CALCIUM 9.4  --  9.1  PROT 6.4*  --  5.8*  ALBUMIN 3.9  --  3.6  AST 573*  --  258*  ALT 631*  --  433*  ALKPHOS 83  --  82  BILITOT 0.7  --  0.8  GFRNONAA >60  --  >60  GFRAA >60  --  >60  ANIONGAP 16*  --  7     Hematology Recent Labs  Lab 03/19/20 1008 03/19/20 1015 03/20/20 0647  WBC 11.5*  --  5.5  RBC 4.71  --  4.46  HGB 14.0 13.6 13.0  HCT 41.4 40.0 40.0  MCV 87.9  --  89.7  MCH 29.7  --  29.1  MCHC 33.8  --  32.5  RDW 12.4  --  12.6  PLT 256  --  195    BNPNo results for input(s):  BNP, PROBNP in the last 168 hours.   DDimer  Recent Labs  Lab 03/19/20 1424  DDIMER 10.36*     Radiology    EEG  Result Date: 03/19/2020 Allison Havens, MD     03/19/2020  4:07 PM [name] MRN: [id number] Epilepsy Attending: Lora Cannon Referring Physician/Provider: Etta Quill, PA Date: 03/19/2020 Duration: 24.15 mins Patient history:  64 year old female with no past medical history presenting to the emergency department after a syncopal episode followed by seizure-like jerking that lasted briefly. EEG to evaluate for seizure Level of alertness: Awake, asleep AEDs during EEG study: None Technical aspects: This EEG study was done with scalp electrodes positioned according to the 10-20 International system of electrode placement. Electrical activity was acquired at a sampling rate of 500Hz  and reviewed with a high frequency filter of 70Hz  and a low frequency filter of 1Hz . EEG data were recorded continuously and digitally stored. Description: The posterior dominant rhythm consists of 8.5-9 Hz  activity of moderate voltage (25-35 uV) seen predominantly in posterior head regions, symmetric and reactive to eye opening and eye closing. Sleep was characterized by vertex waves, maximal frontocentral region.   Hyperventilation and photic stimulation were not performed.   IMPRESSION: This study is within normal limits. No seizures or epileptiform discharges were seen throughout the recording. Allison Cannon   CT ANGIO NECK W OR WO CONTRAST  Result Date: 03/19/2020 CLINICAL DATA:  64 year old female code stroke presentation. EXAM: CT ANGIOGRAPHY HEAD AND NECK TECHNIQUE: Multidetector CT imaging of the head and neck was performed using the standard protocol during bolus administration of intravenous contrast. Multiplanar CT image reconstructions and MIPs were obtained to evaluate the vascular anatomy. Carotid stenosis measurements (when applicable) are obtained utilizing NASCET criteria, using the distal internal carotid diameter as the denominator. CONTRAST:  31mL OMNIPAQUE IOHEXOL 350 MG/ML SOLN COMPARISON:  Plain head CT  1014 hours today. FINDINGS: CTA NECK Skeleton: Chronic C5-C6 disc and endplate degeneration in the cervical spine. No acute osseous abnormality identified. Upper chest: Negative. Other neck: Negative. Aortic arch: 3 vessel arch configuration. No arch atherosclerosis identified. Right carotid system: Negative. Left carotid system: Negative. Vertebral arteries: Proximal right subclavian artery and right vertebral artery origin are normal. The right vertebral is mildly tortuous and patent to the skull base without stenosis. No proximal left subclavian artery or left vertebral artery origin plaque or stenosis. Mildly dominant left vertebral is tortuous and patent to the skull base without stenosis. CTA HEAD A multiphase intracranial CTA was performed although accidentally, the vertex was excluded. Posterior circulation: Dominant left V4 segment with mild calcified plaque, only mild left V4  segment stenosis. Minimal right V4 plaque without stenosis. Normal right PICA origin. Patent vertebrobasilar junction is tortuous without stenosis. Patent basilar artery without stenosis. Normal SCA and PCA origins. Posterior communicating arteries are diminutive or absent. Bilateral PCA branches are within normal limits. Anterior circulation: Both ICA siphons are patent. Minimal siphon plaque and no stenosis. Patent carotid termini. Normal MCA and ACA origins. Anterior communicating artery and visible ACA branches are within normal limits. Left MCA M1 segment and bifurcation are patent without stenosis. Left MCA branches are within normal limits. Right MCA M1 segment and trifurcation are patent. There is no right M2 branch occlusion. And no convincing right MCA branch occlusion. Multiphase: No additional findings. Venous sinuses: Superior sagittal sinus not included. Otherwise patent. Anatomic variants: Dominant left vertebral artery. Review of the MIP images confirms the above findings IMPRESSION: 1. Negative  for large vessel occlusion. 2. Mild for age atherosclerosis, most pronounced in the dominant left vertebral V4 segment. No significant arterial stenosis in the head or neck. Electronically Signed   By: Genevie Ann M.D.   On: 03/19/2020 11:50   CT ANGIO CHEST PE W OR WO CONTRAST  Result Date: 03/20/2020 CLINICAL DATA:  Cardiac arrest. EXAM: CT ANGIOGRAPHY CHEST WITH CONTRAST TECHNIQUE: Multidetector CT imaging of the chest was performed using the standard protocol during bolus administration of intravenous contrast. Multiplanar CT image reconstructions and MIPs were obtained to evaluate the vascular anatomy. CONTRAST:  78mL OMNIPAQUE IOHEXOL 350 MG/ML SOLN COMPARISON:  Chest x-ray dated 03/19/2020 FINDINGS: Cardiovascular: Satisfactory opacification of the pulmonary arteries to the segmental level. No evidence of pulmonary embolism. Normal heart size. No pericardial effusion. Mediastinum/Nodes: No enlarged  mediastinal, hilar, or axillary lymph nodes. Thyroid gland, trachea, and esophagus demonstrate no significant findings. Lungs/Pleura: Slight atelectasis at the lung bases posteriorly. Otherwise normal. No effusions. Upper Abdomen: Normal. Musculoskeletal: Normal. Review of the MIP images confirms the above findings. IMPRESSION: No pulmonary emboli. Slight atelectasis at the lung bases posteriorly. Otherwise, normal exam. Electronically Signed   By: Lorriane Shire M.D.   On: 03/20/2020 11:59   MR BRAIN WO CONTRAST  Result Date: 03/19/2020 CLINICAL DATA:  Stroke, follow-up. EXAM: MRI HEAD WITHOUT CONTRAST TECHNIQUE: Multiplanar, multiecho pulse sequences of the brain and surrounding structures were obtained without intravenous contrast. COMPARISON:  Noncontrast head CT and CT angiogram head/neck performed earlier the same day 03/19/2020. 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 Elsewhere, there is no focal parenchymal signal abnormality within the brain. There is no acute infarct. No chronic intracranial blood products. No extra-axial fluid collection. No midline shift. Vascular: Expected proximal arterial flow voids. Skull and upper cervical spine: No focal marrow lesion. Sinuses/Orbits: Visualized orbits show no acute finding. Minimal ethmoid sinus mucosal thickening. No significant mastoid effusion. IMPRESSION: No evidence of acute infarct. 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 Primary differential considerations include a vestibular schwannoma or meningioma, although this finding is incompletely assessed on this noncontrast study. Contrast-enhanced brain MRI with internal auditory canal protocol is recommended for further characterization. Otherwise unremarkable MRI appearance of the brain. Mild ethmoid sinus mucosal thickening. Electronically Signed   By: Kellie Simmering DO   On: 03/19/2020 14:08   DG Chest Port 1 View  Result Date: 03/19/2020 CLINICAL DATA:  Cardiac arrest with CPR EXAM: PORTABLE CHEST 1 VIEW COMPARISON:  None. FINDINGS: Normal heart size and mediastinal contours. No acute infiltrate or edema. Minimal retrocardiac scarring. No effusion or pneumothorax. No acute osseous findings. Artifact from EKG leads. IMPRESSION: Negative portable chest. Electronically Signed   By: Monte Fantasia M.D.   On: 03/19/2020 10:46   ECHOCARDIOGRAM COMPLETE  Result Date: 03/19/2020    ECHOCARDIOGRAM REPORT   Patient Name:   Allison Cannon Date of Exam: 03/19/2020 Medical Rec #:  [id number]    Height:       67.0 in Accession #:    [id number]   Weight:       135.0 lb Date of Birth:  1956/03/27   BSA:          34.711 m Patient Age:    77 years     BP:           96/59 mmHg Patient Gender: F  HR:           95 bpm. Exam Location:  Inpatient Procedure: 2D Echo, Color Doppler and Cardiac Doppler Indications:    Cardiac Arrest  History:        Patient has no prior history of Echocardiogram examinations.  Sonographer:    Raquel Sarna Senior RDCS Referring Phys: [place]  1. Left ventricular ejection fraction, by estimation, is 70 to 75%. The left ventricle has hyperdynamic function. The left ventricle has no regional wall motion abnormalities. Left ventricular diastolic parameters were normal.  2. Right ventricular systolic function is normal. The right ventricular size is normal.  3. Left atrial size was mildly dilated.  4. Right atrial size was mildly dilated.  5. The mitral valve is normal in structure. Trivial mitral valve regurgitation. No evidence of mitral stenosis.  6. The aortic valve is tricuspid. Aortic valve regurgitation is not visualized. No aortic stenosis is present.  7. Aortic dilatation noted. There is borderline dilatation of the ascending aorta measuring 37 mm.  8. The inferior vena cava is normal in size with <50% respiratory variability,  suggesting right atrial pressure of 8 mmHg. Comparison(s): No prior Echocardiogram. Conclusion(s)/Recommendation(s): Normal biventricular function without evidence of hemodynamically significant valvular heart disease. FINDINGS  Left Ventricle: Left ventricular ejection fraction, by estimation, is 70 to 75%. The left ventricle has hyperdynamic function. The left ventricle has no regional wall motion abnormalities. The left ventricular internal cavity size was normal in size. There is no left ventricular hypertrophy. Left ventricular diastolic parameters were normal. Right Ventricle: The right ventricular size is normal. No increase in right ventricular wall thickness. Right ventricular systolic function is normal. Left Atrium: Left atrial size was mildly dilated. Right Atrium: Right atrial size was mildly dilated. Pericardium: Trivial pericardial effusion is present. Mitral Valve: The mitral valve is normal in structure. Trivial mitral valve regurgitation. No evidence of mitral valve stenosis. Tricuspid Valve: The tricuspid valve is normal in structure. Tricuspid valve regurgitation is trivial. No evidence of tricuspid stenosis. Aortic Valve: The aortic valve is tricuspid. Aortic valve regurgitation is not visualized. No aortic stenosis is present. Pulmonic Valve: The pulmonic valve was grossly normal. Pulmonic valve regurgitation is not visualized. Aorta: Aortic dilatation noted. There is borderline dilatation of the ascending aorta measuring 37 mm. Venous: The inferior vena cava is normal in size with less than 50% respiratory variability, suggesting right atrial pressure of 8 mmHg. IAS/Shunts: No atrial level shunt detected by color flow Doppler. Additional Comments: There is a small pleural effusion in both left and right lateral regions.  LEFT VENTRICLE PLAX 2D LVIDd:         4.60 cm  Diastology LVIDs:         2.50 cm  LV e' lateral:   14.40 cm/s LV PW:         0.90 cm  LV E/e' lateral: 4.2 LV IVS:        1.00  cm  LV e' medial:    9.79 cm/s LVOT diam:     1.90 cm  LV E/e' medial:  6.1 LV SV:         61 LV SV Index:   36 LVOT Area:     2.84 cm  RIGHT VENTRICLE RV S prime:     14.90 cm/s TAPSE (M-mode): 2.9 cm LEFT ATRIUM           Index       RIGHT ATRIUM  Index LA diam:      2.50 cm 1.46 cm/m  RA Area:     19.00 cm LA Vol (A2C): 52.3 ml 30.56 ml/m RA Volume:   51.70 ml  30.21 ml/m LA Vol (A4C): 46.4 ml 27.12 ml/m  AORTIC VALVE LVOT Vmax:   119.00 cm/s LVOT Vmean:  79.200 cm/s LVOT VTI:    0.215 m  AORTA Ao Root diam: 3.20 cm Ao Asc diam:  3.70 cm MITRAL VALVE MV Area (PHT): 2.43 cm    SHUNTS MV Decel Time: 312 msec    Systemic VTI:  0.22 m MV E velocity: 60.00 cm/s  Systemic Diam: 1.90 cm MV A velocity: 57.40 cm/s MV E/A ratio:  1.05 Buford Dresser MD Electronically signed by Buford Dresser MD Signature Date/Time: 03/19/2020/5:23:57 PM    Final    CT HEAD CODE STROKE WO CONTRAST  Result Date: 03/19/2020 CLINICAL DATA:  Code stroke.  64 year old female with ataxia. EXAM: CT HEAD WITHOUT CONTRAST TECHNIQUE: Contiguous axial images were obtained from the base of the skull through the vertex without intravenous contrast. COMPARISON:  None. FINDINGS: Brain: Cerebral volume is within normal limits for age. No midline shift, mass effect, or evidence of intracranial mass lesion. No ventriculomegaly. No acute intracranial hemorrhage identified. Gray-white matter differentiation is within normal limits for age. No acute cortically based infarct or chronic cortical encephalomalacia identified. Posterior fossa gray-white matter differentiation within normal limits. Vascular: Dominant left vertebral artery. Mild Calcified atherosclerosis at the skull base. No suspicious intracranial vascular hyperdensity. Skull: No acute osseous abnormality identified. Sinuses/Orbits: Visualized paranasal sinuses and mastoids are clear. Other: No acute orbit or scalp soft tissue finding. ASPECTS [facility] Stroke Program  Early CT Score) Total score (0-10 with 10 being normal): 10 IMPRESSION: 1. Normal for age non contrast CT appearance of the brain. ASPECTS 10. 2. These results were communicated to Dr. Cheral Marker at 10:21 am on 03/19/2020 by text page via the [facility] messaging system. Electronically Signed   By: Genevie Ann M.D.   On: 03/19/2020 10:21   CT ANGIO HEAD CODE STROKE  Result Date: 03/19/2020 CLINICAL DATA:  64 year old female code stroke presentation. EXAM: CT ANGIOGRAPHY HEAD AND NECK TECHNIQUE: Multidetector CT imaging of the head and neck was performed using the standard protocol during bolus administration of intravenous contrast. Multiplanar CT image reconstructions and MIPs were obtained to evaluate the vascular anatomy. Carotid stenosis measurements (when applicable) are obtained utilizing NASCET criteria, using the distal internal carotid diameter as the denominator. CONTRAST:  71mL OMNIPAQUE IOHEXOL 350 MG/ML SOLN COMPARISON:  Plain head CT  1014 hours today. FINDINGS: CTA NECK Skeleton: Chronic C5-C6 disc and endplate degeneration in the cervical spine. No acute osseous abnormality identified. Upper chest: Negative. Other neck: Negative. Aortic arch: 3 vessel arch configuration. No arch atherosclerosis identified. Right carotid system: Negative. Left carotid system: Negative. Vertebral arteries: Proximal right subclavian artery and right vertebral artery origin are normal. The right vertebral is mildly tortuous and patent to the skull base without stenosis. No proximal left subclavian artery or left vertebral artery origin plaque or stenosis. Mildly dominant left vertebral is tortuous and patent to the skull base without stenosis. CTA HEAD A multiphase intracranial CTA was performed although accidentally, the vertex was excluded. Posterior circulation: Dominant left V4 segment with mild calcified plaque, only mild left V4 segment stenosis. Minimal right V4 plaque without stenosis. Normal right PICA origin. Patent  vertebrobasilar junction is tortuous without stenosis. Patent basilar artery without stenosis. Normal SCA and PCA origins. Posterior communicating  arteries are diminutive or absent. Bilateral PCA branches are within normal limits. Anterior circulation: Both ICA siphons are patent. Minimal siphon plaque and no stenosis. Patent carotid termini. Normal MCA and ACA origins. Anterior communicating artery and visible ACA branches are within normal limits. Left MCA M1 segment and bifurcation are patent without stenosis. Left MCA branches are within normal limits. Right MCA M1 segment and trifurcation are patent. There is no right M2 branch occlusion. And no convincing right MCA branch occlusion. Multiphase: No additional findings. Venous sinuses: Superior sagittal sinus not included. Otherwise patent. Anatomic variants: Dominant left vertebral artery. Review of the MIP images confirms the above findings IMPRESSION: 1. Negative for large vessel occlusion. 2. Mild for age atherosclerosis, most pronounced in the dominant left vertebral V4 segment. No significant arterial stenosis in the head or neck. Electronically Signed   By: Genevie Ann M.D.   On: 03/19/2020 11:50   US Abdomen Limited RUQ  Result Date: 03/19/2020 CLINICAL DATA:  Elevated LFTs EXAM: ULTRASOUND ABDOMEN LIMITED RIGHT UPPER QUADRANT COMPARISON:  None. FINDINGS: Gallbladder: Prior cholecystectomy Common bile duct: Diameter: Normal caliber, 5 mm Liver: No focal lesion identified. Within normal limits in parenchymal echogenicity. Portal vein is patent on color Doppler imaging with normal direction of blood flow towards the liver. Other: None. IMPRESSION: No acute findings. Electronically Signed   By: Rolm Baptise M.D.   On: 03/19/2020 23:24    Cardiac Studies     Patient Profile     64 y.o. female with no chronic medical issues admitted following a syncopal event/cardiac arrest on 03/19/20. She was exercising and lost consciousness. Witnessed arrest with  immediate CPR and defibrillation by AED. No persistent neuro issues post arrest.   Assessment & Plan    1. Cardiac arrest: No evidence of ACS (Troponin peak 88 then down to 23 post arrest/CPR). EKG without ischemic changes. Echo with normal LV systolic function. Low suspicion for obstructive CAD. Her D-dimer was elevated but chest CTA without PE. Cardiac MRI this am then cardiac cath today to exclude CAD. If no CAD, will plan to proceed with ICD implantation.  For questions or updates, please contact [facility] Please consult [email or web address] for contact info under        Signed, Lauree Chandler, MD  03/21/2020, 8:02 AM

## 2020-03-21 NOTE — Progress Notes (Signed)
Progress Note  [name] Date of Encounter: 03/21/2020  Primary Cardiologist: [name] (new)  Subjective   No chest pain or dyspnea. No events overnight.   Inpatient Medications    Scheduled Meds: . aspirin EC  81 mg Oral Daily  . enoxaparin (LOVENOX) injection  40 mg Subcutaneous Q24H  . ondansetron (ZOFRAN) IV  4 mg Intravenous Once  . sodium chloride flush  3 mL Intravenous Q12H   Continuous Infusions: . sodium chloride 100 mL/hr at 03/20/20 1937  . sodium chloride    . sodium chloride 1 mL/kg/hr (03/21/20 0458)   PRN Meds: sodium chloride, acetaminophen **OR** acetaminophen, diphenhydrAMINE, sodium chloride flush   Vital Signs    Vitals:   03/20/20 0949 03/20/20 1343 03/20/20 2018 03/21/20 0612  BP: 112/73 107/68 119/81 126/76  Pulse: 80 78 74 83  Resp: 16 16 18 20   Temp: 98.3 F (36.8 C) 98.4 F (36.9 C) 98.2 F (36.8 C) 98 F (36.7 C)  TempSrc: Oral Oral Oral Oral  SpO2: 97% 96% 98% 97%  Weight:    62 kg  Height:        Intake/Output Summary (Last 24 hours) at 03/21/2020 0802 Last data filed at 03/20/2020 1500 Gross per 24 hour  Intake 139.73 ml  Output --  Net 139.73 ml   Last 3 Weights 03/21/2020 03/20/2020 03/19/2020  Weight (lbs) 136 lb 11.2 oz 135 lb 4.8 oz 135 lb 12.8 oz  Weight (kg) 62.007 kg 61.372 kg 61.598 kg      Telemetry    sinus- Personally Reviewed  ECG    No AM EKG - Personally Reviewed  Physical Exam   General: Well developed, well nourished, NAD  HEENT: OP clear, mucus membranes moist  SKIN: warm, dry. No rashes. Neuro: No focal deficits  Musculoskeletal: Muscle strength 5/5 all ext  Psychiatric: Mood and affect normal  Neck: No JVD, no carotid bruits, no thyromegaly, no lymphadenopathy.  Lungs:Clear bilaterally, no wheezes, rhonci, crackles Cardiovascular: Regular rate and rhythm. No murmurs, gallops or rubs. Abdomen:Soft. Bowel sounds present. Non-tender.  Extremities: No lower extremity edema. Pulses are  2 + in the bilateral DP/PT.   Labs    High Sensitivity Troponin:   Recent Labs  Lab 03/19/20 1008 03/19/20 1424 03/20/20 0647  TROPONINIHS 8 88* 23*      Chemistry Recent Labs  Lab 03/19/20 1008 03/19/20 1015 03/20/20 0647  NA 139 139 140  K 3.3* 3.3* 3.9  CL 103 103 106  CO2 20*  --  27  GLUCOSE 214* 209* 96  BUN 10 10 7*  CREATININE 0.86 0.60 0.72  CALCIUM 9.4  --  9.1  PROT 6.4*  --  5.8*  ALBUMIN 3.9  --  3.6  AST 573*  --  258*  ALT 631*  --  433*  ALKPHOS 83  --  82  BILITOT 0.7  --  0.8  GFRNONAA >60  --  >60  GFRAA >60  --  >60  ANIONGAP 16*  --  7     Hematology Recent Labs  Lab 03/19/20 1008 03/19/20 1015 03/20/20 0647  WBC 11.5*  --  5.5  RBC 4.71  --  4.46  HGB 14.0 13.6 13.0  HCT 41.4 40.0 40.0  MCV 87.9  --  89.7  MCH 29.7  --  29.1  MCHC 33.8  --  32.5  RDW 12.4  --  12.6  PLT 256  --  195    BNPNo results for input(s):  BNP, PROBNP in the last 168 hours.   DDimer  Recent Labs  Lab 03/19/20 1424  DDIMER 10.36*     Radiology    EEG  Result Date: 03/19/2020 Lora Havens, MD     03/19/2020  4:07 PM Patient Name: Keerthana Cleaves MRN: [id number] Epilepsy Attending: Lora Havens Referring Physician/Provider: Etta Quill, PA Date: 03/19/2020 Duration: 24.15 mins Patient history:  64 year old female with no past medical history presenting to the emergency department after a syncopal episode followed by seizure-like jerking that lasted briefly. EEG to evaluate for seizure Level of alertness: Awake, asleep AEDs during EEG study: None Technical aspects: This EEG study was done with scalp electrodes positioned according to the 10-20 International system of electrode placement. Electrical activity was acquired at a sampling rate of 500Hz  and reviewed with a high frequency filter of 70Hz  and a low frequency filter of 1Hz . EEG data were recorded continuously and digitally stored. Description: The posterior dominant rhythm consists of 8.5-9 Hz  activity of moderate voltage (25-35 uV) seen predominantly in posterior head regions, symmetric and reactive to eye opening and eye closing. Sleep was characterized by vertex waves, maximal frontocentral region.   Hyperventilation and photic stimulation were not performed.   IMPRESSION: This study is within normal limits. No seizures or epileptiform discharges were seen throughout the recording. Priyanka Barbra Sarks   CT ANGIO NECK W OR WO CONTRAST  Result Date: 03/19/2020 CLINICAL DATA:  64 year old female code stroke presentation. EXAM: CT ANGIOGRAPHY HEAD AND NECK TECHNIQUE: Multidetector CT imaging of the head and neck was performed using the standard protocol during bolus administration of intravenous contrast. Multiplanar CT image reconstructions and MIPs were obtained to evaluate the vascular anatomy. Carotid stenosis measurements (when applicable) are obtained utilizing NASCET criteria, using the distal internal carotid diameter as the denominator. CONTRAST:  61mL OMNIPAQUE IOHEXOL 350 MG/ML SOLN COMPARISON:  Plain head CT  1014 hours today. FINDINGS: CTA NECK Skeleton: Chronic C5-C6 disc and endplate degeneration in the cervical spine. No acute osseous abnormality identified. Upper chest: Negative. Other neck: Negative. Aortic arch: 3 vessel arch configuration. No arch atherosclerosis identified. Right carotid system: Negative. Left carotid system: Negative. Vertebral arteries: Proximal right subclavian artery and right vertebral artery origin are normal. The right vertebral is mildly tortuous and patent to the skull base without stenosis. No proximal left subclavian artery or left vertebral artery origin plaque or stenosis. Mildly dominant left vertebral is tortuous and patent to the skull base without stenosis. CTA HEAD A multiphase intracranial CTA was performed although accidentally, the vertex was excluded. Posterior circulation: Dominant left V4 segment with mild calcified plaque, only mild left V4  segment stenosis. Minimal right V4 plaque without stenosis. Normal right PICA origin. Patent vertebrobasilar junction is tortuous without stenosis. Patent basilar artery without stenosis. Normal SCA and PCA origins. Posterior communicating arteries are diminutive or absent. Bilateral PCA branches are within normal limits. Anterior circulation: Both ICA siphons are patent. Minimal siphon plaque and no stenosis. Patent carotid termini. Normal MCA and ACA origins. Anterior communicating artery and visible ACA branches are within normal limits. Left MCA M1 segment and bifurcation are patent without stenosis. Left MCA branches are within normal limits. Right MCA M1 segment and trifurcation are patent. There is no right M2 branch occlusion. And no convincing right MCA branch occlusion. Multiphase: No additional findings. Venous sinuses: Superior sagittal sinus not included. Otherwise patent. Anatomic variants: Dominant left vertebral artery. Review of the MIP images confirms the above findings IMPRESSION: 1. Negative  for large vessel occlusion. 2. Mild for age atherosclerosis, most pronounced in the dominant left vertebral V4 segment. No significant arterial stenosis in the head or neck. Electronically Signed   By: Genevie Ann M.D.   On: 03/19/2020 11:50   CT ANGIO CHEST PE W OR WO CONTRAST  Result Date: 03/20/2020 CLINICAL DATA:  Cardiac arrest. EXAM: CT ANGIOGRAPHY CHEST WITH CONTRAST TECHNIQUE: Multidetector CT imaging of the chest was performed using the standard protocol during bolus administration of intravenous contrast. Multiplanar CT image reconstructions and MIPs were obtained to evaluate the vascular anatomy. CONTRAST:  35mL OMNIPAQUE IOHEXOL 350 MG/ML SOLN COMPARISON:  Chest x-ray dated 03/19/2020 FINDINGS: Cardiovascular: Satisfactory opacification of the pulmonary arteries to the segmental level. No evidence of pulmonary embolism. Normal heart size. No pericardial effusion. Mediastinum/Nodes: No enlarged  mediastinal, hilar, or axillary lymph nodes. Thyroid gland, trachea, and esophagus demonstrate no significant findings. Lungs/Pleura: Slight atelectasis at the lung bases posteriorly. Otherwise normal. No effusions. Upper Abdomen: Normal. Musculoskeletal: Normal. Review of the MIP images confirms the above findings. IMPRESSION: No pulmonary emboli. Slight atelectasis at the lung bases posteriorly. Otherwise, normal exam. Electronically Signed   By: Lorriane Shire M.D.   On: 03/20/2020 11:59   MR BRAIN WO CONTRAST  Result Date: 03/19/2020 CLINICAL DATA:  Stroke, follow-up. EXAM: MRI HEAD WITHOUT CONTRAST TECHNIQUE: Multiplanar, multiecho pulse sequences of the brain and surrounding structures were obtained without intravenous contrast. COMPARISON:  Noncontrast head CT and CT angiogram head/neck performed earlier the same day 03/19/2020. 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 Elsewhere, there is no focal parenchymal signal abnormality within the brain. There is no acute infarct. No chronic intracranial blood products. No extra-axial fluid collection. No midline shift. Vascular: Expected proximal arterial flow voids. Skull and upper cervical spine: No focal marrow lesion. Sinuses/Orbits: Visualized orbits show no acute finding. Minimal ethmoid sinus mucosal thickening. No significant mastoid effusion. IMPRESSION: No evidence of acute infarct. 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 Primary differential considerations include a vestibular schwannoma or meningioma, although this finding is incompletely assessed on this noncontrast study. Contrast-enhanced brain MRI with internal auditory canal protocol is recommended for further characterization. Otherwise unremarkable MRI appearance of the brain. Mild ethmoid sinus mucosal thickening. Electronically Signed   By: Kellie Simmering DO   On: 03/19/2020 14:08   DG Chest Port 1 View  Result Date: 03/19/2020 CLINICAL DATA:  Cardiac arrest with CPR EXAM: PORTABLE CHEST 1 VIEW COMPARISON:  None. FINDINGS: Normal heart size and mediastinal contours. No acute infiltrate or edema. Minimal retrocardiac scarring. No effusion or pneumothorax. No acute osseous findings. Artifact from EKG leads. IMPRESSION: Negative portable chest. Electronically Signed   By: Monte Fantasia M.D.   On: 03/19/2020 10:46   ECHOCARDIOGRAM COMPLETE  Result Date: 03/19/2020    ECHOCARDIOGRAM REPORT   Patient Name:   DONSHA MEHOK Date of Exam: 03/19/2020 Medical Rec #:  [id number]    Height:       67.0 in Accession #:    [id number]   Weight:       135.0 lb Date of Birth:  09/30/1956   BSA:          36.711 m Patient Age:    89 years     BP:           96/59 mmHg Patient Gender: F  HR:           95 bpm. Exam Location:  Inpatient Procedure: 2D Echo, Color Doppler and Cardiac Doppler Indications:    Cardiac Arrest  History:        Patient has no prior history of Echocardiogram examinations.  Sonographer:    Raquel Sarna Senior RDCS Referring Phys: [place]  1. Left ventricular ejection fraction, by estimation, is 70 to 75%. The left ventricle has hyperdynamic function. The left ventricle has no regional wall motion abnormalities. Left ventricular diastolic parameters were normal.  2. Right ventricular systolic function is normal. The right ventricular size is normal.  3. Left atrial size was mildly dilated.  4. Right atrial size was mildly dilated.  5. The mitral valve is normal in structure. Trivial mitral valve regurgitation. No evidence of mitral stenosis.  6. The aortic valve is tricuspid. Aortic valve regurgitation is not visualized. No aortic stenosis is present.  7. Aortic dilatation noted. There is borderline dilatation of the ascending aorta measuring 37 mm.  8. The inferior vena cava is normal in size with <50% respiratory variability,  suggesting right atrial pressure of 8 mmHg. Comparison(s): No prior Echocardiogram. Conclusion(s)/Recommendation(s): Normal biventricular function without evidence of hemodynamically significant valvular heart disease. FINDINGS  Left Ventricle: Left ventricular ejection fraction, by estimation, is 70 to 75%. The left ventricle has hyperdynamic function. The left ventricle has no regional wall motion abnormalities. The left ventricular internal cavity size was normal in size. There is no left ventricular hypertrophy. Left ventricular diastolic parameters were normal. Right Ventricle: The right ventricular size is normal. No increase in right ventricular wall thickness. Right ventricular systolic function is normal. Left Atrium: Left atrial size was mildly dilated. Right Atrium: Right atrial size was mildly dilated. Pericardium: Trivial pericardial effusion is present. Mitral Valve: The mitral valve is normal in structure. Trivial mitral valve regurgitation. No evidence of mitral valve stenosis. Tricuspid Valve: The tricuspid valve is normal in structure. Tricuspid valve regurgitation is trivial. No evidence of tricuspid stenosis. Aortic Valve: The aortic valve is tricuspid. Aortic valve regurgitation is not visualized. No aortic stenosis is present. Pulmonic Valve: The pulmonic valve was grossly normal. Pulmonic valve regurgitation is not visualized. Aorta: Aortic dilatation noted. There is borderline dilatation of the ascending aorta measuring 37 mm. Venous: The inferior vena cava is normal in size with less than 50% respiratory variability, suggesting right atrial pressure of 8 mmHg. IAS/Shunts: No atrial level shunt detected by color flow Doppler. Additional Comments: There is a small pleural effusion in both left and right lateral regions.  LEFT VENTRICLE PLAX 2D LVIDd:         4.60 cm  Diastology LVIDs:         2.50 cm  LV e' lateral:   14.40 cm/s LV PW:         0.90 cm  LV E/e' lateral: 4.2 LV IVS:        1.00  cm  LV e' medial:    9.79 cm/s LVOT diam:     1.90 cm  LV E/e' medial:  6.1 LV SV:         61 LV SV Index:   36 LVOT Area:     2.84 cm  RIGHT VENTRICLE RV S prime:     14.90 cm/s TAPSE (M-mode): 2.9 cm LEFT ATRIUM           Index       RIGHT ATRIUM  Index LA diam:      2.50 cm 1.46 cm/m  RA Area:     19.00 cm LA Vol (A2C): 52.3 ml 30.56 ml/m RA Volume:   51.70 ml  30.21 ml/m LA Vol (A4C): 46.4 ml 27.12 ml/m  AORTIC VALVE LVOT Vmax:   119.00 cm/s LVOT Vmean:  79.200 cm/s LVOT VTI:    0.215 m  AORTA Ao Root diam: 3.20 cm Ao Asc diam:  3.70 cm MITRAL VALVE MV Area (PHT): 2.43 cm    SHUNTS MV Decel Time: 312 msec    Systemic VTI:  0.22 m MV E velocity: 60.00 cm/s  Systemic Diam: 1.90 cm MV A velocity: 57.40 cm/s MV E/A ratio:  1.05 Buford Dresser MD Electronically signed by Buford Dresser MD Signature Date/Time: 03/19/2020/5:23:57 PM    Final    CT HEAD CODE STROKE WO CONTRAST  Result Date: 03/19/2020 CLINICAL DATA:  Code stroke.  64 year old female with ataxia. EXAM: CT HEAD WITHOUT CONTRAST TECHNIQUE: Contiguous axial images were obtained from the base of the skull through the vertex without intravenous contrast. COMPARISON:  None. FINDINGS: Brain: Cerebral volume is within normal limits for age. No midline shift, mass effect, or evidence of intracranial mass lesion. No ventriculomegaly. No acute intracranial hemorrhage identified. Gray-white matter differentiation is within normal limits for age. No acute cortically based infarct or chronic cortical encephalomalacia identified. Posterior fossa gray-white matter differentiation within normal limits. Vascular: Dominant left vertebral artery. Mild Calcified atherosclerosis at the skull base. No suspicious intracranial vascular hyperdensity. Skull: No acute osseous abnormality identified. Sinuses/Orbits: Visualized paranasal sinuses and mastoids are clear. Other: No acute orbit or scalp soft tissue finding. ASPECTS [facility]  Early CT Score) Total score (0-10 with 10 being normal): 10 IMPRESSION: 1. Normal for age non contrast CT appearance of the brain. ASPECTS 10. 2. These results were communicated to Dr. Cheral Marker at 10:21 am on 03/19/2020 by text page via the [facility] messaging system. Electronically Signed   By: Genevie Ann M.D.   On: 03/19/2020 10:21   CT ANGIO HEAD CODE STROKE  Result Date: 03/19/2020 CLINICAL DATA:  64 year old female code stroke presentation. EXAM: CT ANGIOGRAPHY HEAD AND NECK TECHNIQUE: Multidetector CT imaging of the head and neck was performed using the standard protocol during bolus administration of intravenous contrast. Multiplanar CT image reconstructions and MIPs were obtained to evaluate the vascular anatomy. Carotid stenosis measurements (when applicable) are obtained utilizing NASCET criteria, using the distal internal carotid diameter as the denominator. CONTRAST:  75mL OMNIPAQUE IOHEXOL 350 MG/ML SOLN COMPARISON:  Plain head CT  1014 hours today. FINDINGS: CTA NECK Skeleton: Chronic C5-C6 disc and endplate degeneration in the cervical spine. No acute osseous abnormality identified. Upper chest: Negative. Other neck: Negative. Aortic arch: 3 vessel arch configuration. No arch atherosclerosis identified. Right carotid system: Negative. Left carotid system: Negative. Vertebral arteries: Proximal right subclavian artery and right vertebral artery origin are normal. The right vertebral is mildly tortuous and patent to the skull base without stenosis. No proximal left subclavian artery or left vertebral artery origin plaque or stenosis. Mildly dominant left vertebral is tortuous and patent to the skull base without stenosis. CTA HEAD A multiphase intracranial CTA was performed although accidentally, the vertex was excluded. Posterior circulation: Dominant left V4 segment with mild calcified plaque, only mild left V4 segment stenosis. Minimal right V4 plaque without stenosis. Normal right PICA origin. Patent  vertebrobasilar junction is tortuous without stenosis. Patent basilar artery without stenosis. Normal SCA and PCA origins. Posterior communicating  arteries are diminutive or absent. Bilateral PCA branches are within normal limits. Anterior circulation: Both ICA siphons are patent. Minimal siphon plaque and no stenosis. Patent carotid termini. Normal MCA and ACA origins. Anterior communicating artery and visible ACA branches are within normal limits. Left MCA M1 segment and bifurcation are patent without stenosis. Left MCA branches are within normal limits. Right MCA M1 segment and trifurcation are patent. There is no right M2 branch occlusion. And no convincing right MCA branch occlusion. Multiphase: No additional findings. Venous sinuses: Superior sagittal sinus not included. Otherwise patent. Anatomic variants: Dominant left vertebral artery. Review of the MIP images confirms the above findings IMPRESSION: 1. Negative for large vessel occlusion. 2. Mild for age atherosclerosis, most pronounced in the dominant left vertebral V4 segment. No significant arterial stenosis in the head or neck. Electronically Signed   By: Genevie Ann M.D.   On: 03/19/2020 11:50   US Abdomen Limited RUQ  Result Date: 03/19/2020 CLINICAL DATA:  Elevated LFTs EXAM: ULTRASOUND ABDOMEN LIMITED RIGHT UPPER QUADRANT COMPARISON:  None. FINDINGS: Gallbladder: Prior cholecystectomy Common bile duct: Diameter: Normal caliber, 5 mm Liver: No focal lesion identified. Within normal limits in parenchymal echogenicity. Portal vein is patent on color Doppler imaging with normal direction of blood flow towards the liver. Other: None. IMPRESSION: No acute findings. Electronically Signed   By: Rolm Baptise M.D.   On: 03/19/2020 23:24    Cardiac Studies     Patient Profile     64 y.o. female with no chronic medical issues admitted following a syncopal event/cardiac arrest on 03/19/20. She was exercising and lost consciousness. Witnessed arrest with  immediate CPR and defibrillation by AED. No persistent neuro issues post arrest.   Assessment & Plan    1. Cardiac arrest: No evidence of ACS (Troponin peak 88 then down to 23 post arrest/CPR). EKG without ischemic changes. Echo with normal LV systolic function. Low suspicion for obstructive CAD. Her D-dimer was elevated but chest CTA without PE. Cardiac MRI this am then cardiac cath today to exclude CAD. If no CAD, will plan to proceed with ICD implantation.  For questions or updates, please contact [place] Please consult [email or web address] for contact info under        Signed, Lauree Chandler, MD  03/21/2020, 8:02 AM

## 2020-03-21 NOTE — Plan of Care (Signed)
  Problem: Education: Goal: Knowledge of General Education information will improve Description: Including pain rating scale, medication(s)/side effects and non-pharmacologic comfort measures Outcome: Progressing   Problem: Clinical Measurements: Goal: Diagnostic test results will improve Outcome: Progressing Goal: Cardiovascular complication will be avoided Outcome: Progressing   Problem: Nutrition: Goal: Adequate nutrition will be maintained Outcome: Adequate for Discharge

## 2020-03-21 NOTE — Progress Notes (Signed)
MRI brain with contrast confirms a left vestibular Schwannoma.  Recommendations: -- Most likely risks of treatment with surgery or gamma knife (deafness, vestibular dysfunction), would significantly outweigh potential benefits.  -- Re-image in 1 year to assess for stability.  -- Call ENT to discuss the above for second opinion.  -- Discussed with patient's husband.   Electronically signed: Dr. Kerney Elbe

## 2020-03-22 ENCOUNTER — Encounter: Payer: Self-pay | Admitting: Orthopaedic Surgery

## 2020-03-22 ENCOUNTER — Inpatient Hospital Stay (HOSPITAL_COMMUNITY): Payer: BC Managed Care – PPO

## 2020-03-22 DIAGNOSIS — R7989 Other specified abnormal findings of blood chemistry: Secondary | ICD-10-CM

## 2020-03-22 LAB — CBC
HCT: 37.4 % (ref 36.0–46.0)
Hemoglobin: 12.6 g/dL (ref 12.0–15.0)
MCH: 29.9 pg (ref 26.0–34.0)
MCHC: 33.7 g/dL (ref 30.0–36.0)
MCV: 88.8 fL (ref 80.0–100.0)
Platelets: 153 10*3/uL (ref 150–400)
RBC: 4.21 MIL/uL (ref 3.87–5.11)
RDW: 12.4 % (ref 11.5–15.5)
WBC: 5.3 10*3/uL (ref 4.0–10.5)
nRBC: 0 % (ref 0.0–0.2)

## 2020-03-22 LAB — BASIC METABOLIC PANEL
Anion gap: 8 (ref 5–15)
BUN: <5 mg/dL — ABNORMAL LOW (ref 8–23)
CO2: 25 mmol/L (ref 22–32)
Calcium: 8.8 mg/dL — ABNORMAL LOW (ref 8.9–10.3)
Chloride: 106 mmol/L (ref 98–111)
Creatinine, Ser: 0.52 mg/dL (ref 0.44–1.00)
GFR calc Af Amer: >60 mL/min (ref >60)
GFR calc non Af Amer: >60 mL/min (ref >60)
Glucose, Bld: 100 mg/dL — ABNORMAL HIGH (ref 70–99)
Potassium: 3.7 mmol/L (ref 3.5–5.1)
Sodium: 139 mmol/L (ref 135–145)

## 2020-03-22 LAB — MAGNESIUM: Magnesium: 1.5 mg/dL — ABNORMAL LOW (ref 1.7–2.4)

## 2020-03-22 MED ORDER — METOPROLOL SUCCINATE ER 25 MG PO TB24
25.0000 mg | ORAL_TABLET | Freq: Every day | ORAL | Status: DC
  Administered 2020-03-22: 25 mg via ORAL
  Filled 2020-03-22: qty 1

## 2020-03-22 MED ORDER — METOPROLOL SUCCINATE ER 25 MG PO TB24: 25.0000 mg | ORAL_TABLET | Freq: Every day | ORAL | 5 refills | Status: DC

## 2020-03-22 MED ORDER — MAGNESIUM OXIDE 400 (241.3 MG) MG PO TABS
400.0000 mg | ORAL_TABLET | Freq: Two times a day (BID) | ORAL | Status: DC
  Administered 2020-03-22: 400 mg via ORAL
  Filled 2020-03-22: qty 1

## 2020-03-22 MED ORDER — MAGNESIUM OXIDE 400 (241.3 MG) MG PO TABS: 400.0000 mg | ORAL_TABLET | Freq: Two times a day (BID) | ORAL | 0 refills | Status: AC

## 2020-03-22 MED ORDER — MAGNESIUM SULFATE 2 GM/50ML IV SOLN
2.0000 g | Freq: Once | INTRAVENOUS | Status: AC
  Administered 2020-03-22: 2 g via INTRAVENOUS

## 2020-03-22 MED ORDER — ACETAMINOPHEN 325 MG PO TABS: 325.0000 mg | ORAL_TABLET | ORAL | 0 refills | Status: DC | PRN

## 2020-03-22 NOTE — Progress Notes (Signed)
Progress Note  [name] Date of Encounter: 03/22/2020  Primary Cardiologist: [name] (new)  Subjective   No chest pain or dyspnea this am. No events  Inpatient Medications    Scheduled Meds:  aspirin EC  81 mg Oral Daily   Chlorhexidine Gluconate Cloth  6 each Topical Daily   enoxaparin (LOVENOX) injection  40 mg Subcutaneous Q24H   LORazepam  1 mg Intravenous Once   magnesium oxide  400 mg Oral BID   mupirocin ointment  1 application Nasal BID   ondansetron (ZOFRAN) IV  4 mg Intravenous Once   sodium chloride flush  3 mL Intravenous Q12H   sodium chloride flush  3 mL Intravenous Q12H   Continuous Infusions:  sodium chloride 100 mL/hr at 03/20/20 1937   sodium chloride     magnesium sulfate bolus IVPB     PRN Meds: sodium chloride, acetaminophen **OR** acetaminophen, acetaminophen, acetaminophen, diphenhydrAMINE, ondansetron (ZOFRAN) IV, ondansetron (ZOFRAN) IV, sodium chloride flush   Vital Signs    Vitals:   03/21/20 1853 03/21/20 1948 03/21/20 1954 03/22/20 0551  BP: 133/89 (!) 147/86 (!) 157/83 (!) 145/83  Pulse: 80 88 88 87  Resp:   19   Temp:   98.2 F (36.8 C) 98 F (36.7 C)  TempSrc:   Oral Oral  SpO2: 98% 97% 97% 98%  Weight:    62.1 kg  Height:        Intake/Output Summary (Last 24 hours) at 03/22/2020 0907 Last data filed at 03/22/2020 0650 Gross per 24 hour  Intake 243 ml  Output --  Net 243 ml   Last 3 Weights 03/22/2020 03/21/2020 03/20/2020  Weight (lbs) 136 lb 14.4 oz 136 lb 11.2 oz 135 lb 4.8 oz  Weight (kg) 62.097 kg 62.007 kg 61.372 kg      Telemetry    Sinus- Personally Reviewed  ECG     Sinus, rare PVC- Personally Reviewed  Physical Exam   General: Well developed, well nourished, NAD  HEENT: OP clear, mucus membranes moist  SKIN: warm, dry. No rashes. Neuro: No focal deficits  Musculoskeletal: Muscle strength 5/5 all ext  Psychiatric: Mood and affect normal  Neck: No JVD, no carotid bruits, no  thyromegaly, no lymphadenopathy.  Lungs:Clear bilaterally, no wheezes, rhonci, crackles Cardiovascular: Regular rate and rhythm. No murmurs, gallops or rubs. Abdomen:Soft. Bowel sounds present. Non-tender.  Extremities: No lower extremity edema. Pulses are 2 + in the bilateral DP/PT.   Labs    High Sensitivity Troponin:   Recent Labs  Lab 03/19/20 1008 03/19/20 1424 03/20/20 0647  TROPONINIHS 8 88* 23*      Chemistry Recent Labs  Lab 03/19/20 1008 03/19/20 1015 03/20/20 0647 03/21/20 0815 03/22/20 0340  NA 139   < > 140 140 139  K 3.3*   < > 3.9 3.9 3.7  CL 103   < > 106 108 106  CO2 20*   < > 27 23 25   GLUCOSE 214*   < > 96 96 100*  BUN 10   < > 7* 6* <5*  CREATININE 0.86   < > 0.72 0.59 0.52  CALCIUM 9.4   < > 9.1 8.9 8.8*  PROT 6.4*  --  5.8*  --   --   ALBUMIN 3.9  --  3.6  --   --   AST 573*  --  258*  --   --   ALT 631*  --  433*  --   --  ALKPHOS 83  --  82  --   --   BILITOT 0.7  --  0.8  --   --   GFRNONAA >60   < > >60 >60 >60  GFRAA >60   < > >60 >60 >60  ANIONGAP 16*   < > 7 9 8    < > = values in this interval not displayed.     Hematology Recent Labs  Lab 03/19/20 1008 03/19/20 1008 03/19/20 1015 03/20/20 0647 03/22/20 0340  WBC 11.5*  --   --  5.5 5.3  RBC 4.71  --   --  4.46 4.21  HGB 14.0   < > 13.6 13.0 12.6  HCT 41.4   < > 40.0 40.0 37.4  MCV 87.9  --   --  89.7 88.8  MCH 29.7  --   --  29.1 29.9  MCHC 33.8  --   --  32.5 33.7  RDW 12.4  --   --  12.6 12.4  PLT 256  --   --  195 153   < > = values in this interval not displayed.    BNPNo results for input(s): BNP, PROBNP in the last 168 hours.   DDimer  Recent Labs  Lab 03/19/20 1424  DDIMER 10.36*     Radiology    DG Chest 2 View  Result Date: 03/22/2020 CLINICAL DATA:  ICD EXAM: CHEST - 2 VIEW COMPARISON:  03/19/2020 FINDINGS: New left chest wall single lead ICD. The lead overlies the right atrium. No pneumothorax. No new consolidation or edema. Trace pleural  effusions. Normal heart size. IMPRESSION: New left chest wall single lead ICD with lead overlying the right atrium. No pneumothorax. Trace pleural effusions. Electronically Signed   By: Macy Mis M.D.   On: 03/22/2020 08:50   CT ANGIO CHEST PE W OR WO CONTRAST  Result Date: 03/20/2020 CLINICAL DATA:  Cardiac arrest. EXAM: CT ANGIOGRAPHY CHEST WITH CONTRAST TECHNIQUE: Multidetector CT imaging of the chest was performed using the standard protocol during bolus administration of intravenous contrast. Multiplanar CT image reconstructions and MIPs were obtained to evaluate the vascular anatomy. CONTRAST:  84mL OMNIPAQUE IOHEXOL 350 MG/ML SOLN COMPARISON:  Chest x-ray dated 03/19/2020 FINDINGS: Cardiovascular: Satisfactory opacification of the pulmonary arteries to the segmental level. No evidence of pulmonary embolism. Normal heart size. No pericardial effusion. Mediastinum/Nodes: No enlarged mediastinal, hilar, or axillary lymph nodes. Thyroid gland, trachea, and esophagus demonstrate no significant findings. Lungs/Pleura: Slight atelectasis at the lung bases posteriorly. Otherwise normal. No effusions. Upper Abdomen: Normal. Musculoskeletal: Normal. Review of the MIP images confirms the above findings. IMPRESSION: No pulmonary emboli. Slight atelectasis at the lung bases posteriorly. Otherwise, normal exam. Electronically Signed   By: Lorriane Shire M.D.   On: 03/20/2020 11:59   MR BRAIN W CONTRAST  Result Date: 03/21/2020 CLINICAL DATA:  Schwannoma EXAM: MRI HEAD WITH CONTRAST TECHNIQUE: Multiplanar, multiecho pulse sequences of the brain and surrounding structures were obtained with intravenous contrast. CONTRAST:  7mL GADAVIST GADOBUTROL 1 MMOL/ML IV SOLN COMPARISON:  03/19/2020 FINDINGS: Postcontrast sequences demonstrate a 1.1 x 1.6 x 0.9 cm enhancing lesion within the left cerebellopontine angle cistern extending into the internal auditory canal. No substantial mass effect on the adjacent brachium  pontis. There is no additional mass or abnormal enhancement. IMPRESSION: Small mass of the left cerebellopontine angle cistern extending into the internal auditory canal most consistent with a vestibular schwannoma. Electronically Signed   By: Macy Mis M.D.   On: 03/21/2020 14:10  CARDIAC CATHETERIZATION  Result Date: 03/21/2020 Normal coronary arteries with a very dominant right coronary system. LV EDP 9 mmHg. RECOMMENDATION: Catheterization results were reviewed with Dr. Crissie Sickles.  With normal coronary arteries, the patient will be transported to the EP lab for placement of ICD.   EP PPM/ICD IMPLANT  Result Date: 03/21/2020 CONCLUSIONS:  1. Resuscitated VF arrest  2. Successful ICD implantation.  3. DFT less than or equal to 25 joules.  4. No early apparent complications. ICD Criteria Current LVEF:55%. Within 12 months prior to implant: Yes Heart failure history: No Cardiomyopathy history: No. Atrial Fibrillation/Atrial Flutter: No. Ventricular tachycardia history: No. Cardiac arrest history: Yes, Ventricular Fibrillation. History of syndromes with risk of sudden death: No. Previous ICD: No. Current ICD indication: Secondary PPM indication: No.  Class I or II Bradycardia indication present: No Beta Blocker therapy for 3 or more months: No, medical reason. Ace Inhibitor/ARB therapy for 3 or more months: No, medical reason. Cristopher Peru, MD 3:50 PM 03/21/2020   MR CARDIAC MORPHOLOGY W WO CONTRAST  Result Date: 03/21/2020 CLINICAL DATA:  64 year old female post cardiac arrest and normal coronaries. EXAM: CARDIAC MRI TECHNIQUE: The patient was scanned on a 1.5 Tesla GE magnet. A dedicated cardiac coil was used. Functional imaging was done using Fiesta sequences. 2,3, and 4 chamber views were done to assess for RWMA's. Modified Simpson's rule using a short axis stack was used to calculate an ejection fraction on a dedicated work Conservation officer, nature. The patient received 8 cc of Gadavist.  After 10 minutes inversion recovery sequences were used to assess for infiltration and scar tissue. CONTRAST:  8 cc  of Gadavist FINDINGS: 1. Normal left ventricular size with moderate basal septal hypertrophy and mild hypertrophy of the remaining myocardium and hyperdynamic systolic function (LVEF = 74%). There are no regional wall motion abnormalities and no late gadolinium enhancement in the left ventricular myocardium. Extracellular volume is increased at 33% (normal < 25%). LVEDD: 52 mm LVESD: 32 mm LVEDV: 136 ml LVESV: 34 ml SV: 102 ml CO: 5.3 L/min Myocardial mass: 132 g 2. Normal right ventricular size, thickness and systolic function (LVEF = 65%). There are no regional wall motion abnormalities. 3. Mildly dilated left atrium.  normal right atrial size. 4. Normal size of the aortic root, ascending aorta and pulmonary artery. 5.  Mild mitral regurgitation. 6. Normal pericardium.  Trivial pericardial effusion. IMPRESSION: 1. Normal left ventricular size with moderate basal septal hypertrophy and mild hypertrophy of the remaining myocardium and hyperdynamic systolic function (LVEF = 74%). There are no regional wall motion abnormalities and no late gadolinium enhancement in the left ventricular myocardium. Extracellular volume is increased at 33% (normal < 25%). 2. Normal right ventricular size, thickness and systolic function (LVEF = 65%). There are no regional wall motion abnormalities. 3. Mildly dilated left atrium. normal right atrial size. 4. Normal size of the aortic root, ascending aorta and pulmonary artery. 5. Mild mitral regurgitation. 6. Normal pericardium.  Trivial pericardial effusion. These findings are consistent with hypertensive heart disease. There is no evidence for arrhythmogenic right ventricular cardiomyopathy or infiltrative cardiomyopathy. Electronically Signed   By: Ena Dawley   On: 03/21/2020 17:25    Cardiac Studies     Patient Profile     64 y.o. female with no chronic  medical issues admitted following a syncopal event/cardiac arrest on 03/19/20. She was exercising and lost consciousness. Witnessed arrest with immediate CPR and defibrillation by AED. No persistent neuro issues post arrest.  Assessment & Plan    1. Cardiac arrest: No evidence of ACS (Troponin peak 88 then down to 23 post arrest/CPR). EKG without ischemic changes. Cardiac cath without CAD. Echo with normal LV systolic function. Her D-dimer was elevated but chest CTA without PE. Cardiac MRI with no evidence of arrhythmogenic RV cardiomyopathy or infiltrative cardiomyopathy. ICD implanted 03/21/20.  She can be discharged home today after she is seen by our EP team.  Follow up with EP  For questions or updates, please contact [facility] Please consult [email or web address] for contact info under        Signed, Lauree Chandler, MD  03/22/2020, 9:07 AM

## 2020-03-22 NOTE — Discharge Summary (Signed)
Physician Discharge Summary  Garlene Apperson HKV:[id number] DOB: 1956-10-18 DOA: 03/19/2020  PCP: Patient, No Pcp Per  Admit date: 03/19/2020 Discharge date: 03/22/2020  Admitted From: Home  Disposition:  Home   Recommendations for Outpatient Follow-up:  1. Follow up with PCP in 1-2 weeks 2. Please obtain BMP/CBC in one week 3. Follow up with EP post ICD placement.  4. Needs repeat Mg level.  5. Needs to follow up with ENT for further evaluation of vestibular Schwannoma.  6. Needs repeat LFT.   Home Health: None  Discharge Condition: Stable.  CODE STATUS: Full code Diet recommendation: Heart Healthy  Brief/Interim Summary: 64 year old with no significant past medical history other than chronic left leg pain secondary to piriformis muscle compression, presents after a cardiopulmonary arrest and CPR.  Patient has been doing outpatient PT and workout class at the [facility] for leg pain.  At 8:30 AM, just before regular yoga class, patient suddenly collapsed without any prodromes.  Downtime was minimal.  CPR restarted, at one point AED showed shockable rhythm, patient had 1 shock delivered.  CPR was continued.  Patient will regain circulation.  On EMS arrival patient was noted to have a normal rhythm.  On arrival to the ED patient was noted to have a left gaze deviation and a slurry speech. Evaluation in the ED EKG with no significant changes, EEG negative for seizure.  Low magnesium and low potassium level.  1-Cardiac Arrest, S/P CPR and shock by AED. Presume VF arrest.  CT angio negative for PE.  ECHO ordered. EF 70-75 % CATH; Normal Coronary arteries.  Evaluated by EP, if not significant abnormalities found on cath, plan to proceed with ICD.  Patient underwent ICD placement 6-04. Stable for discharge.  EP reviewed wound care for ICD with patient.   2-Elevation of D dimer;  CT angio negative for PE>  Doppler ordered. Pending  3-Hypokalemia/Hypomagnesemia;  Replaced.  Discharge on  supplement.   Transaminases Related to  Liver hypoperfusion.  LFT trending down  [place] negative.   Seizure like activity post cardiac arrest;  Evaluated by neurology CT head no acute abnormalities  EEG no evidence of seizure.  MRI brain: 11 mm left cerebellopontine angle mass, vestibular schwannoma or meningioma.  Follow neurology recommendation.  Advised not to drive or swim for 6 months.   Vestibular schwannoma. :11 mm left cerebellopontine angle mass;  Consistent with schwannoma.  Plan to follow up with ENT for second opinion.      Discharge Diagnoses:  Active Problems:   Cardiac arrest [facility])   LFT elevation    Discharge Instructions  Discharge Instructions    Diet - low sodium heart healthy   Complete by: As directed    Increase activity slowly   Complete by: As directed    No wound care   Complete by: As directed      Allergies as of 03/22/2020   Not on File     Medication List    STOP taking these medications   naproxen sodium 220 MG tablet Commonly known as: ALEVE     TAKE these medications   acetaminophen 325 MG tablet Commonly known as: TYLENOL Take 1-2 tablets (325-650 mg total) by mouth every 4 (four) hours as needed for mild pain.   diphenhydrAMINE 25 MG tablet Commonly known as: BENADRYL Take 25 mg by mouth every 6 (six) hours as needed for allergies (cough).   diphenhydramine-acetaminophen 25-500 MG Tabs tablet Commonly known as: TYLENOL PM Take 1 tablet by mouth at bedtime  as needed (pain).   ibuprofen 200 MG tablet Commonly known as: ADVIL Take 200 mg by mouth every 6 (six) hours as needed for moderate pain.   magnesium oxide 400 (241.3 Mg) MG tablet Commonly known as: MAG-OX Take 1 tablet (400 mg total) by mouth 2 (two) times daily.   metoprolol succinate 25 MG 24 hr tablet Commonly known as: TOPROL-XL Take 1 tablet (25 mg total) by mouth daily.      Follow-up Information    [place] [facility] Follow up.    Specialty: Cardiology Why: 04/04/2020 @ 2:30PM, wound check visit Contact information: [address] [place]       Evans Lance, MD Follow up.   Specialty: Cardiology Why: 06/27/2020 @ 2:30PM Contact information: [address] [place] [address] [phone]          Not on File  Consultations:  Cardiology    Procedures/Studies: EEG  Result Date: 03/19/2020 Lora Havens, MD     03/19/2020  4:07 PM Patient Name: Laqueta Bonaventura MRN: [id number] Epilepsy Attending: Lora Havens Referring Physician/Provider: Etta Quill, PA Date: 03/19/2020 Duration: 24.15 mins Patient history:  64 year old female with no past medical history presenting to the emergency department after a syncopal episode followed by seizure-like jerking that lasted briefly. EEG to evaluate for seizure Level of alertness: Awake, asleep AEDs during EEG study: None Technical aspects: This EEG study was done with scalp electrodes positioned according to the 10-20 International system of electrode placement. Electrical activity was acquired at a sampling rate of 500Hz  and reviewed with a high frequency filter of 70Hz  and a low frequency filter of 1Hz . EEG data were recorded continuously and digitally stored. Description: The posterior dominant rhythm consists of 8.5-9 Hz activity of moderate voltage (25-35 uV) seen predominantly in posterior head regions, symmetric and reactive to eye opening and eye closing. Sleep was characterized by vertex waves, maximal frontocentral region.   Hyperventilation and photic stimulation were not performed.   IMPRESSION: This study is within normal limits. No seizures or epileptiform discharges were seen throughout the recording. Lora Havens   DG Chest 2 View  Result Date: 03/22/2020 CLINICAL DATA:  ICD EXAM: CHEST - 2 VIEW COMPARISON:  03/19/2020 FINDINGS: New left chest wall single lead ICD. The lead  overlies the right atrium. No pneumothorax. No new consolidation or edema. Trace pleural effusions. Normal heart size. IMPRESSION: New left chest wall single lead ICD with lead overlying the right atrium. No pneumothorax. Trace pleural effusions. Electronically Signed   By: Macy Mis M.D.   On: 03/22/2020 08:50   CT ANGIO NECK W OR WO CONTRAST  Result Date: 03/19/2020 CLINICAL DATA:  64 year old female code stroke presentation. EXAM: CT ANGIOGRAPHY HEAD AND NECK TECHNIQUE: Multidetector CT imaging of the head and neck was performed using the standard protocol during bolus administration of intravenous contrast. Multiplanar CT image reconstructions and MIPs were obtained to evaluate the vascular anatomy. Carotid stenosis measurements (when applicable) are obtained utilizing NASCET criteria, using the distal internal carotid diameter as the denominator. CONTRAST:  73mL OMNIPAQUE IOHEXOL 350 MG/ML SOLN COMPARISON:  Plain head CT  1014 hours today. FINDINGS: CTA NECK Skeleton: Chronic C5-C6 disc and endplate degeneration in the cervical spine. No acute osseous abnormality identified. Upper chest: Negative. Other neck: Negative. Aortic arch: 3 vessel arch configuration. No arch atherosclerosis identified. Right carotid system: Negative. Left carotid system: Negative. Vertebral arteries: Proximal right subclavian artery  and right vertebral artery origin are normal. The right vertebral is mildly tortuous and patent to the skull base without stenosis. No proximal left subclavian artery or left vertebral artery origin plaque or stenosis. Mildly dominant left vertebral is tortuous and patent to the skull base without stenosis. CTA HEAD A multiphase intracranial CTA was performed although accidentally, the vertex was excluded. Posterior circulation: Dominant left V4 segment with mild calcified plaque, only mild left V4 segment stenosis. Minimal right V4 plaque without stenosis. Normal right PICA origin. Patent  vertebrobasilar junction is tortuous without stenosis. Patent basilar artery without stenosis. Normal SCA and PCA origins. Posterior communicating arteries are diminutive or absent. Bilateral PCA branches are within normal limits. Anterior circulation: Both ICA siphons are patent. Minimal siphon plaque and no stenosis. Patent carotid termini. Normal MCA and ACA origins. Anterior communicating artery and visible ACA branches are within normal limits. Left MCA M1 segment and bifurcation are patent without stenosis. Left MCA branches are within normal limits. Right MCA M1 segment and trifurcation are patent. There is no right M2 branch occlusion. And no convincing right MCA branch occlusion. Multiphase: No additional findings. Venous sinuses: Superior sagittal sinus not included. Otherwise patent. Anatomic variants: Dominant left vertebral artery. Review of the MIP images confirms the above findings IMPRESSION: 1. Negative for large vessel occlusion. 2. Mild for age atherosclerosis, most pronounced in the dominant left vertebral V4 segment. No significant arterial stenosis in the head or neck. Electronically Signed   By: Genevie Ann M.D.   On: 03/19/2020 11:50   CT ANGIO CHEST PE W OR WO CONTRAST  Result Date: 03/20/2020 CLINICAL DATA:  Cardiac arrest. EXAM: CT ANGIOGRAPHY CHEST WITH CONTRAST TECHNIQUE: Multidetector CT imaging of the chest was performed using the standard protocol during bolus administration of intravenous contrast. Multiplanar CT image reconstructions and MIPs were obtained to evaluate the vascular anatomy. CONTRAST:  42mL OMNIPAQUE IOHEXOL 350 MG/ML SOLN COMPARISON:  Chest x-ray dated 03/19/2020 FINDINGS: Cardiovascular: Satisfactory opacification of the pulmonary arteries to the segmental level. No evidence of pulmonary embolism. Normal heart size. No pericardial effusion. Mediastinum/Nodes: No enlarged mediastinal, hilar, or axillary lymph nodes. Thyroid gland, trachea, and esophagus demonstrate no  significant findings. Lungs/Pleura: Slight atelectasis at the lung bases posteriorly. Otherwise normal. No effusions. Upper Abdomen: Normal. Musculoskeletal: Normal. Review of the MIP images confirms the above findings. IMPRESSION: No pulmonary emboli. Slight atelectasis at the lung bases posteriorly. Otherwise, normal exam. Electronically Signed   By: Lorriane Shire M.D.   On: 03/20/2020 11:59   MR BRAIN WO CONTRAST  Result Date: 03/19/2020 CLINICAL DATA:  Stroke, follow-up. EXAM: MRI HEAD WITHOUT CONTRAST TECHNIQUE: Multiplanar, multiecho pulse sequences of the brain and surrounding structures were obtained without intravenous contrast. COMPARISON:  Noncontrast head CT and CT angiogram head/neck performed earlier the same day 03/19/2020. 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 Elsewhere, there is no focal parenchymal signal abnormality within the brain. There is no acute infarct. No chronic intracranial blood products. No extra-axial fluid collection. No midline shift. Vascular: Expected proximal arterial flow voids. Skull and upper cervical spine: No focal marrow lesion. Sinuses/Orbits: Visualized orbits show no acute finding. Minimal ethmoid sinus mucosal thickening. No significant mastoid effusion. IMPRESSION: No evidence of acute infarct. 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 Primary differential considerations include a vestibular schwannoma or meningioma, although this finding is incompletely assessed on this noncontrast study. Contrast-enhanced  brain MRI with internal auditory canal protocol is recommended for further characterization. Otherwise unremarkable MRI appearance of the brain. Mild ethmoid sinus mucosal thickening. Electronically Signed   By: Kellie Simmering DO   On: 03/19/2020 14:08   MR BRAIN W CONTRAST  Result Date: 03/21/2020 CLINICAL DATA:   Schwannoma EXAM: MRI HEAD WITH CONTRAST TECHNIQUE: Multiplanar, multiecho pulse sequences of the brain and surrounding structures were obtained with intravenous contrast. CONTRAST:  6mL GADAVIST GADOBUTROL 1 MMOL/ML IV SOLN COMPARISON:  03/19/2020 FINDINGS: Postcontrast sequences demonstrate a 1.1 x 1.6 x 0.9 cm enhancing lesion within the left cerebellopontine angle cistern extending into the internal auditory canal. No substantial mass effect on the adjacent brachium pontis. There is no additional mass or abnormal enhancement. IMPRESSION: Small mass of the left cerebellopontine angle cistern extending into the internal auditory canal most consistent with a vestibular schwannoma. Electronically Signed   By: Macy Mis M.D.   On: 03/21/2020 14:10   CARDIAC CATHETERIZATION  Result Date: 03/21/2020 Normal coronary arteries with a very dominant right coronary system. LV EDP 9 mmHg. RECOMMENDATION: Catheterization results were reviewed with Dr. Crissie Sickles.  With normal coronary arteries, the patient will be transported to the EP lab for placement of ICD.   EP PPM/ICD IMPLANT  Result Date: 03/21/2020 CONCLUSIONS:  1. Resuscitated VF arrest  2. Successful ICD implantation.  3. DFT less than or equal to 25 joules.  4. No early apparent complications. ICD Criteria Current LVEF:55%. Within 12 months prior to implant: Yes Heart failure history: No Cardiomyopathy history: No. Atrial Fibrillation/Atrial Flutter: No. Ventricular tachycardia history: No. Cardiac arrest history: Yes, Ventricular Fibrillation. History of syndromes with risk of sudden death: No. Previous ICD: No. Current ICD indication: Secondary PPM indication: No.  Class I or II Bradycardia indication present: No Beta Blocker therapy for 3 or more months: No, medical reason. Ace Inhibitor/ARB therapy for 3 or more months: No, medical reason. Cristopher Peru, MD 3:50 PM 03/21/2020   DG Chest Port 1 View  Result Date: 03/19/2020 CLINICAL DATA:  Cardiac  arrest with CPR EXAM: PORTABLE CHEST 1 VIEW COMPARISON:  None. FINDINGS: Normal heart size and mediastinal contours. No acute infiltrate or edema. Minimal retrocardiac scarring. No effusion or pneumothorax. No acute osseous findings. Artifact from EKG leads. IMPRESSION: Negative portable chest. Electronically Signed   By: Monte Fantasia M.D.   On: 03/19/2020 10:46   MR CARDIAC MORPHOLOGY W WO CONTRAST  Result Date: 03/21/2020 CLINICAL DATA:  64 year old female post cardiac arrest and normal coronaries. EXAM: CARDIAC MRI TECHNIQUE: The patient was scanned on a 1.5 Tesla GE magnet. A dedicated cardiac coil was used. Functional imaging was done using Fiesta sequences. 2,3, and 4 chamber views were done to assess for RWMA's. Modified Simpson's rule using a short axis stack was used to calculate an ejection fraction on a dedicated work Conservation officer, nature. The patient received 8 cc of Gadavist. After 10 minutes inversion recovery sequences were used to assess for infiltration and scar tissue. CONTRAST:  8 cc  of Gadavist FINDINGS: 1. Normal left ventricular size with moderate basal septal hypertrophy and mild hypertrophy of the remaining myocardium and hyperdynamic systolic function (LVEF = 74%). There are no regional wall motion abnormalities and no late gadolinium enhancement in the left ventricular myocardium. Extracellular volume is increased at 33% (normal < 25%). LVEDD: 52 mm LVESD: 32 mm LVEDV: 136 ml LVESV: 34 ml SV: 102 ml CO: 5.3 L/min Myocardial mass: 132 g 2. Normal right ventricular size, thickness  and systolic function (LVEF = 65%). There are no regional wall motion abnormalities. 3. Mildly dilated left atrium.  normal right atrial size. 4. Normal size of the aortic root, ascending aorta and pulmonary artery. 5.  Mild mitral regurgitation. 6. Normal pericardium.  Trivial pericardial effusion. IMPRESSION: 1. Normal left ventricular size with moderate basal septal hypertrophy and mild  hypertrophy of the remaining myocardium and hyperdynamic systolic function (LVEF = 74%). There are no regional wall motion abnormalities and no late gadolinium enhancement in the left ventricular myocardium. Extracellular volume is increased at 33% (normal < 25%). 2. Normal right ventricular size, thickness and systolic function (LVEF = 65%). There are no regional wall motion abnormalities. 3. Mildly dilated left atrium. normal right atrial size. 4. Normal size of the aortic root, ascending aorta and pulmonary artery. 5. Mild mitral regurgitation. 6. Normal pericardium.  Trivial pericardial effusion. These findings are consistent with hypertensive heart disease. There is no evidence for arrhythmogenic right ventricular cardiomyopathy or infiltrative cardiomyopathy. Electronically Signed   By: Ena Dawley   On: 03/21/2020 17:25   ECHOCARDIOGRAM COMPLETE  Result Date: 03/19/2020    ECHOCARDIOGRAM REPORT   Patient Name:   NADIA VIAR Date of Exam: 03/19/2020 Medical Rec #:  [id number]    Height:       67.0 in Accession #:    [id number]   Weight:       135.0 lb Date of Birth:  01-13-1956   BSA:          48.711 m Patient Age:    88 years     BP:           96/59 mmHg Patient Gender: F            HR:           95 bpm. Exam Location:  Inpatient Procedure: 2D Echo, Color Doppler and Cardiac Doppler Indications:    Cardiac Arrest  History:        Patient has no prior history of Echocardiogram examinations.  Sonographer:    Raquel Sarna Senior RDCS Referring Phys: Fernando Salinas  1. Left ventricular ejection fraction, by estimation, is 70 to 75%. The left ventricle has hyperdynamic function. The left ventricle has no regional wall motion abnormalities. Left ventricular diastolic parameters were normal.  2. Right ventricular systolic function is normal. The right ventricular size is normal.  3. Left atrial size was mildly dilated.  4. Right atrial size was mildly dilated.  5. The mitral valve is normal in  structure. Trivial mitral valve regurgitation. No evidence of mitral stenosis.  6. The aortic valve is tricuspid. Aortic valve regurgitation is not visualized. No aortic stenosis is present.  7. Aortic dilatation noted. There is borderline dilatation of the ascending aorta measuring 37 mm.  8. The inferior vena cava is normal in size with <50% respiratory variability, suggesting right atrial pressure of 8 mmHg. Comparison(s): No prior Echocardiogram. Conclusion(s)/Recommendation(s): Normal biventricular function without evidence of hemodynamically significant valvular heart disease. FINDINGS  Left Ventricle: Left ventricular ejection fraction, by estimation, is 70 to 75%. The left ventricle has hyperdynamic function. The left ventricle has no regional wall motion abnormalities. The left ventricular internal cavity size was normal in size. There is no left ventricular hypertrophy. Left ventricular diastolic parameters were normal. Right Ventricle: The right ventricular size is normal. No increase in right ventricular wall thickness. Right ventricular systolic function is normal. Left Atrium: Left atrial size was mildly dilated. Right Atrium: Right atrial size  was mildly dilated. Pericardium: Trivial pericardial effusion is present. Mitral Valve: The mitral valve is normal in structure. Trivial mitral valve regurgitation. No evidence of mitral valve stenosis. Tricuspid Valve: The tricuspid valve is normal in structure. Tricuspid valve regurgitation is trivial. No evidence of tricuspid stenosis. Aortic Valve: The aortic valve is tricuspid. Aortic valve regurgitation is not visualized. No aortic stenosis is present. Pulmonic Valve: The pulmonic valve was grossly normal. Pulmonic valve regurgitation is not visualized. Aorta: Aortic dilatation noted. There is borderline dilatation of the ascending aorta measuring 37 mm. Venous: The inferior vena cava is normal in size with less than 50% respiratory variability, suggesting  right atrial pressure of 8 mmHg. IAS/Shunts: No atrial level shunt detected by color flow Doppler. Additional Comments: There is a small pleural effusion in both left and right lateral regions.  LEFT VENTRICLE PLAX 2D LVIDd:         4.60 cm  Diastology LVIDs:         2.50 cm  LV e' lateral:   14.40 cm/s LV PW:         0.90 cm  LV E/e' lateral: 4.2 LV IVS:        1.00 cm  LV e' medial:    9.79 cm/s LVOT diam:     1.90 cm  LV E/e' medial:  6.1 LV SV:         61 LV SV Index:   36 LVOT Area:     2.84 cm  RIGHT VENTRICLE RV S prime:     14.90 cm/s TAPSE (M-mode): 2.9 cm LEFT ATRIUM           Index       RIGHT ATRIUM           Index LA diam:      2.50 cm 1.46 cm/m  RA Area:     19.00 cm LA Vol (A2C): 52.3 ml 30.56 ml/m RA Volume:   51.70 ml  30.21 ml/m LA Vol (A4C): 46.4 ml 27.12 ml/m  AORTIC VALVE LVOT Vmax:   119.00 cm/s LVOT Vmean:  79.200 cm/s LVOT VTI:    0.215 m  AORTA Ao Root diam: 3.20 cm Ao Asc diam:  3.70 cm MITRAL VALVE MV Area (PHT): 2.43 cm    SHUNTS MV Decel Time: 312 msec    Systemic VTI:  0.22 m MV E velocity: 60.00 cm/s  Systemic Diam: 1.90 cm MV A velocity: 57.40 cm/s MV E/A ratio:  1.05 Buford Dresser MD Electronically signed by Buford Dresser MD Signature Date/Time: 03/19/2020/5:23:57 PM    Final    CT HEAD CODE STROKE WO CONTRAST  Result Date: 03/19/2020 CLINICAL DATA:  Code stroke.  64 year old female with ataxia. EXAM: CT HEAD WITHOUT CONTRAST TECHNIQUE: Contiguous axial images were obtained from the base of the skull through the vertex without intravenous contrast. COMPARISON:  None. FINDINGS: Brain: Cerebral volume is within normal limits for age. No midline shift, mass effect, or evidence of intracranial mass lesion. No ventriculomegaly. No acute intracranial hemorrhage identified. Gray-white matter differentiation is within normal limits for age. No acute cortically based infarct or chronic cortical encephalomalacia identified. Posterior fossa gray-white matter  differentiation within normal limits. Vascular: Dominant left vertebral artery. Mild Calcified atherosclerosis at the skull base. No suspicious intracranial vascular hyperdensity. Skull: No acute osseous abnormality identified. Sinuses/Orbits: Visualized paranasal sinuses and mastoids are clear. Other: No acute orbit or scalp soft tissue finding. ASPECTS [facility] Stroke Program Early CT Score) Total score (0-10 with 10 being normal):  10 IMPRESSION: 1. Normal for age non contrast CT appearance of the brain. ASPECTS 10. 2. These results were communicated to Dr. Cheral Marker at 10:21 am on 03/19/2020 by text page via the [facility] messaging system. Electronically Signed   By: Genevie Ann M.D.   On: 03/19/2020 10:21   VAS [place] LOWER EXTREMITY VENOUS (DVT)  Result Date: 03/22/2020  Lower Venous DVTStudy Indications: D dimer.  Risk Factors: Cardiac arrest while exercising/ had CPR performed. Comparison Study: none Performing Technologist: June Leap RDMS, RVT  Examination Guidelines: A complete evaluation includes B-mode imaging, spectral Doppler, color Doppler, and power Doppler as needed of all accessible portions of each vessel. Bilateral testing is considered an integral part of a complete examination. Limited examinations for reoccurring indications may be performed as noted. The reflux portion of the exam is performed with the patient in reverse Trendelenburg.  +---------+---------------+---------+-----------+----------+--------------+ RIGHT    CompressibilityPhasicitySpontaneityPropertiesThrombus Aging +---------+---------------+---------+-----------+----------+--------------+ CFV      Full           Yes      Yes                                 +---------+---------------+---------+-----------+----------+--------------+ SFJ      Full                                                        +---------+---------------+---------+-----------+----------+--------------+ FV Prox  Full                                                         +---------+---------------+---------+-----------+----------+--------------+ FV Mid   Full                                                        +---------+---------------+---------+-----------+----------+--------------+ FV DistalFull                                                        +---------+---------------+---------+-----------+----------+--------------+ PFV      Full                                                        +---------+---------------+---------+-----------+----------+--------------+ POP      Full           Yes      Yes                                 +---------+---------------+---------+-----------+----------+--------------+ PTV      Full                                                        +---------+---------------+---------+-----------+----------+--------------+  PERO     Full                                                        +---------+---------------+---------+-----------+----------+--------------+   +---------+---------------+---------+-----------+----------+--------------+ LEFT     CompressibilityPhasicitySpontaneityPropertiesThrombus Aging +---------+---------------+---------+-----------+----------+--------------+ CFV      Full           Yes      Yes                                 +---------+---------------+---------+-----------+----------+--------------+ SFJ      Full                                                        +---------+---------------+---------+-----------+----------+--------------+ FV Prox  Full                                                        +---------+---------------+---------+-----------+----------+--------------+ FV Mid   Full                                                        +---------+---------------+---------+-----------+----------+--------------+ FV DistalFull                                                         +---------+---------------+---------+-----------+----------+--------------+ PFV      Full                                                        +---------+---------------+---------+-----------+----------+--------------+ POP      Full           Yes      Yes                                 +---------+---------------+---------+-----------+----------+--------------+ PTV      Full                                                        +---------+---------------+---------+-----------+----------+--------------+ PERO     Full                                                        +---------+---------------+---------+-----------+----------+--------------+  Summary: BILATERAL: - No evidence of deep vein thrombosis seen in the lower extremities, bilaterally. -No evidence of popliteal cyst, bilaterally.   *See table(s) above for measurements and observations.    Preliminary    CT ANGIO HEAD CODE STROKE  Result Date: 03/19/2020 CLINICAL DATA:  64 year old female code stroke presentation. EXAM: CT ANGIOGRAPHY HEAD AND NECK TECHNIQUE: Multidetector CT imaging of the head and neck was performed using the standard protocol during bolus administration of intravenous contrast. Multiplanar CT image reconstructions and MIPs were obtained to evaluate the vascular anatomy. Carotid stenosis measurements (when applicable) are obtained utilizing NASCET criteria, using the distal internal carotid diameter as the denominator. CONTRAST:  27mL OMNIPAQUE IOHEXOL 350 MG/ML SOLN COMPARISON:  Plain head CT  1014 hours today. FINDINGS: CTA NECK Skeleton: Chronic C5-C6 disc and endplate degeneration in the cervical spine. No acute osseous abnormality identified. Upper chest: Negative. Other neck: Negative. Aortic arch: 3 vessel arch configuration. No arch atherosclerosis identified. Right carotid system: Negative. Left carotid system: Negative. Vertebral arteries: Proximal right subclavian artery and right vertebral  artery origin are normal. The right vertebral is mildly tortuous and patent to the skull base without stenosis. No proximal left subclavian artery or left vertebral artery origin plaque or stenosis. Mildly dominant left vertebral is tortuous and patent to the skull base without stenosis. CTA HEAD A multiphase intracranial CTA was performed although accidentally, the vertex was excluded. Posterior circulation: Dominant left V4 segment with mild calcified plaque, only mild left V4 segment stenosis. Minimal right V4 plaque without stenosis. Normal right PICA origin. Patent vertebrobasilar junction is tortuous without stenosis. Patent basilar artery without stenosis. Normal SCA and PCA origins. Posterior communicating arteries are diminutive or absent. Bilateral PCA branches are within normal limits. Anterior circulation: Both ICA siphons are patent. Minimal siphon plaque and no stenosis. Patent carotid termini. Normal MCA and ACA origins. Anterior communicating artery and visible ACA branches are within normal limits. Left MCA M1 segment and bifurcation are patent without stenosis. Left MCA branches are within normal limits. Right MCA M1 segment and trifurcation are patent. There is no right M2 branch occlusion. And no convincing right MCA branch occlusion. Multiphase: No additional findings. Venous sinuses: Superior sagittal sinus not included. Otherwise patent. Anatomic variants: Dominant left vertebral artery. Review of the MIP images confirms the above findings IMPRESSION: 1. Negative for large vessel occlusion. 2. Mild for age atherosclerosis, most pronounced in the dominant left vertebral V4 segment. No significant arterial stenosis in the head or neck. Electronically Signed   By: Genevie Ann M.D.   On: 03/19/2020 11:50   US Abdomen Limited RUQ  Result Date: 03/19/2020 CLINICAL DATA:  Elevated LFTs EXAM: ULTRASOUND ABDOMEN LIMITED RIGHT UPPER QUADRANT COMPARISON:  None. FINDINGS: Gallbladder: Prior cholecystectomy  Common bile duct: Diameter: Normal caliber, 5 mm Liver: No focal lesion identified. Within normal limits in parenchymal echogenicity. Portal vein is patent on color Doppler imaging with normal direction of blood flow towards the liver. Other: None. IMPRESSION: No acute findings. Electronically Signed   By: Rolm Baptise M.D.   On: 03/19/2020 23:24    Subjective: Alert, feeling well. Mild chest pain, ribs pain post CPR  Discharge Exam: Vitals:   03/21/20 1954 03/22/20 0551  BP: (!) 157/83 (!) 145/83  Pulse: 88 87  Resp: 19   Temp: 98.2 F (36.8 C) 98 F (36.7 C)  SpO2: 97% 98%     General: Pt is alert, awake, not in acute distress Cardiovascular: RRR, S1/S2 +, no rubs, no  gallops Respiratory: CTA bilaterally, no wheezing, no rhonchi Abdominal: Soft, NT, ND, bowel sounds + Extremities: no edema, no cyanosis    The results of significant diagnostics from this hospitalization (including imaging, microbiology, ancillary and laboratory) are listed below for reference.     Microbiology: Recent Results (from the past 240 hour(s))  SARS Coronavirus 2 by RT PCR (hospital order, performed in [facility] hospital lab) Nasopharyngeal Nasopharyngeal Swab     Status: None   Collection Time: 03/19/20 12:03 PM   Specimen: Nasopharyngeal Swab  Result Value Ref Range Status   SARS Coronavirus 2 NEGATIVE NEGATIVE Final    Comment: (NOTE) SARS-CoV-2 target nucleic acids are NOT DETECTED. The SARS-CoV-2 RNA is generally detectable in upper and lower respiratory specimens during the acute phase of infection. The lowest concentration of SARS-CoV-2 viral copies this assay can detect is 250 copies / mL. A negative result does not preclude SARS-CoV-2 infection and should not be used as the sole basis for treatment or other patient management decisions.  A negative result may occur with improper specimen collection / handling, submission of specimen other than nasopharyngeal swab, presence of viral  mutation(s) within the areas targeted by this assay, and inadequate number of viral copies (<250 copies / mL). A negative result must be combined with clinical observations, patient history, and epidemiological information. Fact Sheet for Patients:   StrictlyIdeas.no Fact Sheet for Healthcare Providers: BankingDealers.co.za This test is not yet approved or cleared  by the [place] FDA and has been authorized for detection and/or diagnosis of SARS-CoV-2 by FDA under an Emergency Use Authorization (EUA).  This EUA will remain in effect (meaning this test can be used) for the duration of the COVID-19 declaration under Section 564(b)(1) of the Act, 21 U.S.C. section 360bbb-3(b)(1), unless the authorization is terminated or revoked sooner. Performed at [facility], [place] [address], [place], [place] [address]   Surgical PCR screen     Status: Abnormal   Collection Time: 03/21/20  8:50 AM   Specimen: Nasal Mucosa; Nasal Swab  Result Value Ref Range Status   MRSA, PCR NEGATIVE NEGATIVE Final   Staphylococcus aureus POSITIVE (A) NEGATIVE Final    Comment: (NOTE) The Xpert SA Assay (FDA approved for NASAL specimens in patients 65 years of age and older), is one component of a comprehensive surveillance program. It is not intended to diagnose infection nor to guide or monitor treatment. Performed at [facility], [place] [address] [place], [place] [address]      Labs: BNP (last 3 results) No results for input(s): BNP in the last 8760 hours. Basic Metabolic Panel: Recent Labs  Lab 03/19/20 1008 03/19/20 1015 03/19/20 1424 03/20/20 0647 03/21/20 0815 03/22/20 0340  NA 139 139  --  140 140 139  K 3.3* 3.3*  --  3.9 3.9 3.7  CL 103 103  --  106 108 106  CO2 20*  --   --  27 23 25   GLUCOSE 938* 209*  --  96 96 100*  BUN 10 10  --  7* 6* <5*  CREATININE 0.86 0.60  --  0.72 0.59 0.52  CALCIUM 9.4  --   --  9.1 8.9 8.8*   MG  --   --  1.6* 1.9  --  1.5*   Liver Function Tests: Recent Labs  Lab 03/19/20 1008 03/20/20 0647  AST 573* 258*  ALT 631* 433*  ALKPHOS 83 82  BILITOT 0.7 0.8  PROT 6.4* 5.8*  ALBUMIN 3.9 3.6   No results for input(s): LIPASE, AMYLASE in the last 168 hours. No results for input(s): AMMONIA in the last 168 hours. CBC: Recent Labs  Lab 03/19/20 1008 03/19/20 1015 03/20/20 0647 03/22/20 0340  WBC 11.5*  --  5.5 5.3  NEUTROABS 7.5  --   --   --   HGB 14.0 13.6 13.0 12.6  HCT 41.4 40.0 40.0 37.4  MCV 87.9  --  89.7 88.8  PLT 256  --  195 153   Cardiac Enzymes: Recent Labs  Lab 03/19/20 1639  CKTOTAL 82   BNP: Invalid input(s): POCBNP CBG: No results for input(s): GLUCAP in the last 168 hours. D-Dimer Recent Labs    03/19/20 1424  DDIMER 10.36*   Hgb A1c Recent Labs    03/19/20 1639  HGBA1C 5.5   Lipid Profile No results for input(s): CHOL, HDL, LDLCALC, TRIG, CHOLHDL, LDLDIRECT in the last 72 hours. Thyroid function studies Recent Labs    03/19/20 1424  TSH 1.195   Anemia work up No results for input(s): VITAMINB12, FOLATE, FERRITIN, TIBC, IRON, RETICCTPCT in the last 72 hours. Urinalysis No results found for: COLORURINE, APPEARANCEUR, [place], [place], [place], [place], [place], [place], PROTEINUR, UROBILINOGEN, NITRITE, LEUKOCYTESUR Sepsis Labs Invalid input(s): PROCALCITONIN,  WBC,  LACTICIDVEN Microbiology Recent Results (from the past 240 hour(s))  SARS Coronavirus 2 by RT PCR (hospital order, performed in [facility] hospital lab) Nasopharyngeal Nasopharyngeal Swab     Status: None   Collection Time: 03/19/20 12:03 PM   Specimen: Nasopharyngeal Swab  Result Value Ref Range Status   SARS Coronavirus 2 NEGATIVE NEGATIVE Final    Comment: (NOTE) SARS-CoV-2 target nucleic acids are NOT DETECTED. The SARS-CoV-2 RNA is generally detectable in upper and lower respiratory specimens during the acute phase of infection. The  lowest concentration of SARS-CoV-2 viral copies this assay can detect is 250 copies / mL. A negative result does not preclude SARS-CoV-2 infection and should not be used as the sole basis for treatment or other patient management decisions.  A negative result may occur with improper specimen collection / handling, submission of specimen other than nasopharyngeal swab, presence of viral mutation(s) within the areas targeted by this assay, and inadequate number of viral copies (<250 copies / mL). A negative result must be combined with clinical observations, patient history, and epidemiological information. Fact Sheet for Patients:   StrictlyIdeas.no Fact Sheet for Healthcare Providers: BankingDealers.co.za This test is not yet approved or cleared  by the [place] FDA and has been authorized for detection and/or diagnosis of SARS-CoV-2 by FDA under an Emergency Use Authorization (EUA).  This EUA will remain in effect (meaning this test can be used) for the duration of the COVID-19 declaration under Section 564(b)(1) of the Act, 21 U.S.C. section 360bbb-3(b)(1), unless the authorization is terminated or revoked sooner. Performed at [facility], [place] [address], [place], [place] [address]   Surgical PCR screen     Status: Abnormal   Collection Time: 03/21/20  8:50 AM   Specimen: Nasal Mucosa; Nasal Swab  Result Value Ref Range Status   MRSA, PCR NEGATIVE NEGATIVE Final   Staphylococcus aureus POSITIVE (A) NEGATIVE Final    Comment: (NOTE) The Xpert SA Assay (FDA approved for NASAL specimens in patients 68 years of age and older), is one component of a comprehensive surveillance program. It is not intended to diagnose infection nor to guide or monitor treatment. Performed at [facility], [place],  [place] [address]      Time coordinating discharge: 40 minutes  SIGNED:   Elmarie Shiley,  MD  [facility]

## 2020-03-22 NOTE — Discharge Instructions (Signed)
    Supplemental Discharge Instructions for  Pacemaker/Defibrillator Patients  Activity No heavy lifting or vigorous activity with your left/right arm for 6 to 8 weeks.  Do not raise your left/right arm above your head for one week.  Gradually raise your affected arm as drawn below.              03/25/2020                  03/26/2020                  03/27/2020                 03/28/2020 __  NO DRIVING for 6 months.  WOUND CARE - Keep the wound area clean and dry.  Do not get this area wet for one week. No showers for one week; you may shower on 03/28/2020 . - The tape/steri-strips on your wound will fall off; do not pull them off.  No bandage is needed on the site.  DO  NOT apply any creams, oils, or ointments to the wound area. - If you notice any drainage or discharge from the wound, any swelling or bruising at the site, or you develop a fever > 101? F after you are discharged home, call the office at once.  Special Instructions - You are still able to use cellular telephones; use the ear opposite the side where you have your pacemaker/defibrillator.  Avoid carrying your cellular phone near your device. - When traveling through airports, show security personnel your identification card to avoid being screened in the metal detectors.  Ask the security personnel to use the hand wand. - Avoid arc welding equipment, MRI testing (magnetic resonance imaging), TENS units (transcutaneous nerve stimulators).  Call the office for questions about other devices. - Avoid electrical appliances that are in poor condition or are not properly grounded. - Microwave ovens are safe to be near or to operate.  Additional information for defibrillator patients should your device go off: - If your device goes off ONCE and you feel fine afterward, notify the device clinic nurses. - If your device goes off ONCE and you do not feel well afterward, call 911. - If your device goes off TWICE, call 911. - If your device  goes off THREE times in one day, call 911.  DO NOT DRIVE YOURSELF OR A FAMILY MEMBER WITH A DEFIBRILLATOR TO THE HOSPITAL--CALL 911.  Do not thrive or swim for 6 months.  You need to Follow up with ENT for vestibular schwannoma.  You need Magnesium and Potasium level in 1 week.

## 2020-03-22 NOTE — Progress Notes (Signed)
Venous duplex       has been completed. Preliminary results can be found under CV proc through chart review. Brianne Maina, BS, RDMS, RVT   

## 2020-03-22 NOTE — Progress Notes (Addendum)
Progress Note  [name] Date of Encounter: 03/22/2020  Primary Cardiologist: new to [facility], Dr. [name]  Subjective   No complaints  Inpatient Medications    Scheduled Meds:  aspirin EC  81 mg Oral Daily   Chlorhexidine Gluconate Cloth  6 each Topical Daily   enoxaparin (LOVENOX) injection  40 mg Subcutaneous Q24H   LORazepam  1 mg Intravenous Once   magnesium oxide  400 mg Oral BID   mupirocin ointment  1 application Nasal BID   ondansetron (ZOFRAN) IV  4 mg Intravenous Once   sodium chloride flush  3 mL Intravenous Q12H   sodium chloride flush  3 mL Intravenous Q12H   Continuous Infusions:  sodium chloride 100 mL/hr at 03/20/20 1937   sodium chloride     magnesium sulfate bolus IVPB     PRN Meds: sodium chloride, acetaminophen **OR** acetaminophen, acetaminophen, acetaminophen, diphenhydrAMINE, ondansetron (ZOFRAN) IV, ondansetron (ZOFRAN) IV, sodium chloride flush   Vital Signs    Vitals:   03/21/20 1853 03/21/20 1948 03/21/20 1954 03/22/20 0551  BP: 133/89 (!) 147/86 (!) 157/83 (!) 145/83  Pulse: 80 88 88 87  Resp:   19   Temp:   98.2 F (36.8 C) 98 F (36.7 C)  TempSrc:   Oral Oral  SpO2: 98% 97% 97% 98%  Weight:    62.1 kg  Height:        Intake/Output Summary (Last 24 hours) at 03/22/2020 1001 Last data filed at 03/22/2020 0650 Gross per 24 hour  Intake 243 ml  Output --  Net 243 ml   Last 3 Weights 03/22/2020 03/21/2020 03/20/2020  Weight (lbs) 136 lb 14.4 oz 136 lb 11.2 oz 135 lb 4.8 oz  Weight (kg) 62.097 kg 62.007 kg 61.372 kg      Telemetry    SR 70's generally, she has and rare PVCs and a couple NSVT 4 beats longest - Personally Reviewed  ECG    SR 76bpm, PVC - Personally Reviewed  Physical Exam   GEN: No acute distress.   Neck: No JVD Cardiac: RRR, no murmurs, rubs, or gallops.  Respiratory: CTA b/l. GI: Soft, nontender, non-distended  MS: No edema; No deformity. Neuro:  Nonfocal  Psych: Normal affect   Implant site  :  Looks great, no bleeding, hematoma, minimal tenderness  Labs    High Sensitivity Troponin:   Recent Labs  Lab 03/19/20 1008 03/19/20 1424 03/20/20 0647  TROPONINIHS 8 88* 23*      Chemistry Recent Labs  Lab 03/19/20 1008 03/19/20 1015 03/20/20 0647 03/21/20 0815 03/22/20 0340  NA 139   < > 140 140 139  K 3.3*   < > 3.9 3.9 3.7  CL 103   < > 106 108 106  CO2 20*   < > 27 23 25   GLUCOSE 214*   < > 96 96 100*  BUN 10   < > 7* 6* <5*  CREATININE 0.86   < > 0.72 0.59 0.52  CALCIUM 9.4   < > 9.1 8.9 8.8*  PROT 6.4*  --  5.8*  --   --   ALBUMIN 3.9  --  3.6  --   --   AST 573*  --  258*  --   --   ALT 631*  --  433*  --   --   ALKPHOS 83  --  82  --   --   BILITOT 0.7  --  0.8  --   --  GFRNONAA >60   < > >60 >60 >60  GFRAA >60   < > >60 >60 >60  ANIONGAP 16*   < > 7 9 8    < > = values in this interval not displayed.     Hematology Recent Labs  Lab 03/19/20 1008 03/19/20 1008 03/19/20 1015 03/20/20 0647 03/22/20 0340  WBC 11.5*  --   --  5.5 5.3  RBC 4.71  --   --  4.46 4.21  HGB 14.0   < > 13.6 13.0 12.6  HCT 41.4   < > 40.0 40.0 37.4  MCV 87.9  --   --  89.7 88.8  MCH 29.7  --   --  29.1 29.9  MCHC 33.8  --   --  32.5 33.7  RDW 12.4  --   --  12.6 12.4  PLT 256  --   --  195 153   < > = values in this interval not displayed.    BNPNo results for input(s): BNP, PROBNP in the last 168 hours.   DDimer  Recent Labs  Lab 03/19/20 1424  DDIMER 10.36*     Radiology    03/21/2020: c.MRI IMPRESSION: 1. Normal left ventricular size with moderate basal septal hypertrophy and mild hypertrophy of the remaining myocardium and hyperdynamic systolic function (LVEF = 74%). There are no regional wall motion abnormalities and no late gadolinium enhancement in the left ventricular myocardium. Extracellular volume is increased at 33% (normal < 25%).   2. Normal right ventricular size, thickness and systolic function (LVEF = 65%). There are no regional wall  motion abnormalities.   3. Mildly dilated left atrium. normal right atrial size.   4. Normal size of the aortic root, ascending aorta and pulmonary artery.   5. Mild mitral regurgitation.   6. Normal pericardium.  Trivial pericardial effusion.   These findings are consistent with hypertensive heart disease. There is no evidence for arrhythmogenic right ventricular cardiomyopathy or infiltrative cardiomyopathy.  Cardiac Studies   03/19/2020: TTE IMPRESSIONS  1. Left ventricular ejection fraction, by estimation, is 70 to 75%. The  left ventricle has hyperdynamic function. The left ventricle has no  regional wall motion abnormalities. Left ventricular diastolic parameters  were normal.   2. Right ventricular systolic function is normal. The right ventricular  size is normal.   3. Left atrial size was mildly dilated.   4. Right atrial size was mildly dilated.   5. The mitral valve is normal in structure. Trivial mitral valve  regurgitation. No evidence of mitral stenosis.   6. The aortic valve is tricuspid. Aortic valve regurgitation is not  visualized. No aortic stenosis is present.   7. Aortic dilatation noted. There is borderline dilatation of the  ascending aorta measuring 37 mm.   8. The inferior vena cava is normal in size with <50% respiratory  variability, suggesting right atrial pressure of 8 mmHg.    Patient Profile     64 y.o. female with no notable PMHx admitted after OOH cardiac arrest  Assessment & Plan    1. Cardiac arrest     AED advised and gave shock presumed VF/VT   C.MRI is noted above, no findings to explain cardiac arrest No CAD She is s/p ICD implant yesterday Device check this AM with intact function, stable measurements CXR this AM is without ptx Wound care and activity restrictions were reviewed with the patient Routine post implant follow up is in place We re-discussed no driving 49mo  Creat  stable  Mag 1.5, replacement already ordered K+  3.7   Will start Toprol XL 25mg  daily  EP service will sign off remain available, please recall if needed.  For questions or updates, please contact [place] Please consult [email or web address] for contact info under   Signed, Baldwin [place], PA-C  03/22/2020, 10:01 AM    EP Attending  Patient seen and examined. Agree with the findings as noted above. The patient is stable after ICD insertion. CMRI is reviewed. She will be discharged home.   Mikle Bosworth.D.

## 2020-03-25 MED FILL — Lidocaine HCl Local Inj 1%: INTRAMUSCULAR | Qty: 60 | Status: AC

## 2020-03-25 MED FILL — Lidocaine HCl Local Inj 1%: INTRAMUSCULAR | Qty: 20 | Status: CN

## 2020-04-04 ENCOUNTER — Other Ambulatory Visit: Payer: Self-pay

## 2020-04-04 ENCOUNTER — Ambulatory Visit (INDEPENDENT_AMBULATORY_CARE_PROVIDER_SITE_OTHER): Payer: BC Managed Care – PPO | Admitting: Emergency Medicine

## 2020-04-04 DIAGNOSIS — I469 Cardiac arrest, cause unspecified: Secondary | ICD-10-CM | POA: Diagnosis not present

## 2020-04-15 ENCOUNTER — Telehealth: Payer: Self-pay | Admitting: Internal Medicine

## 2020-04-15 NOTE — Telephone Encounter (Signed)
Called and spoke with the patient. She describes symptoms as more lightheaded than dizzy.  Unsteady.  For the first couple weeks after discharge she was okay.  Lightheadedness came come anytime of day.  Can come on just standing from a chair.  Lasts.  Feeling like she has to be careful while walking an hour on the track at the [facility]    Takes Toprol XL 25 mg at night.  She would like to know if she will need to continue this always, why she taking it and can it be causing her symptoms. Her BP 118/79.  HR is 70s-80s.  100s while out walking.  I told her we would check with Dr. [name] and will call her back with recommendations.

## 2020-04-15 NOTE — Telephone Encounter (Signed)
Only transmission avail was activation on 6/14.  Need manual transmission for recent data.  Attempted to reach pt to have her send.  No answer, left message with instructions to send transmission.  Pt had single chamber ICD implanted on 6/3 for VF.

## 2020-04-15 NOTE — Telephone Encounter (Signed)
     STAT if patient feels like he/she is going to faint   1) Are you dizzy now? No  2) Do you feel faint or have you passed out? No  3) Do you have any other symptoms? No  4) Have you checked your HR and BP (record if available)? 118/79 yesterday   Pt said she's been feeling dizzy, coming and going for about a week now. She said she doesn't feel like she about to pass out but every time she moves around she gets dizzy. She said she onlt takes metoprolol and doesn't know what causing her dizziness

## 2020-04-15 NOTE — Telephone Encounter (Signed)
Manual transmission received.  Presenting rhythm WNL  Vrate 60bpm  No documented arrythmias.

## 2020-04-15 NOTE — Telephone Encounter (Signed)
Her monitor showed rare PVC and NSVT 4 beats longest per Dr. Tanna Furry progress note 03/22/20 _______________________________________________________________________________________  Telemetry    SR 70's generally, she has and rare PVCs and a couple NSVT 4 beats longest - Personally Reviewed  ECG    SR 76bpm, PVC - Personally Reviewed

## 2020-04-16 NOTE — Progress Notes (Signed)
Wound check appointment. Steri-strips removed. Wound without redness or edema. Incision edges approximated, wound well healed. Normal device function. Thresholds, sensing, and impedances consistent with implant measurements. Device programmed at 3.5V for extra safety margin until 3 month visit. Histogram distribution appropriate for patient and level of activity. No ventricular arrhythmias noted. Patient educated about wound care, arm mobility, lifting restrictions, shock plan. ROV with Dr [name] 06/27/20. Enrolled in remote follow up and next remote scheduled for 06/25/20.

## 2020-04-16 NOTE — Telephone Encounter (Signed)
Pt called back to follow up on her call from yesterday. She was concerned because she has not heard anything from our office yet.

## 2020-04-16 NOTE — Telephone Encounter (Signed)
Reduce toprol to half dose. GT

## 2020-04-17 MED ORDER — METOPROLOL SUCCINATE ER 25 MG PO TB24: 12.5000 mg | ORAL_TABLET | Freq: Every day | ORAL | 3 refills | Status: DC

## 2020-04-17 NOTE — Telephone Encounter (Signed)
Per Dr. [name]- Reduce toprol xl 25 mg to 1/2 tablet daily.  Pt will monitor symptoms, if she continues to have dizziness she will contact this nurse via mychart and at that time ok to discontinue.

## 2020-04-17 NOTE — Addendum Note (Signed)
Addended by: Willeen Cass A on: 04/17/2020 08:40 AM   Modules accepted: Orders

## 2020-05-03 ENCOUNTER — Telehealth: Payer: Self-pay | Admitting: Internal Medicine

## 2020-05-03 NOTE — Telephone Encounter (Signed)
    Pt would like to speak with Nurse Sonia Baller, she said its a follow up of her issue few weeks ago.

## 2020-05-03 NOTE — Telephone Encounter (Signed)
Pt c/o continued dizziness on 1/2 tab toprol xl.  States it may be slightly better since decreasing dose  Advised to stop toprol xl  Pt will call back in a few weeks to report if dizziness has resolved.

## 2020-05-16 ENCOUNTER — Telehealth: Payer: Self-pay | Admitting: Emergency Medicine

## 2020-05-16 NOTE — Telephone Encounter (Signed)
Patient called to confirm that she is cleared to do Yoga, swim,  and work out with weights. Patient is over 6 weeks out from implant and informed that it was ok to resume all physical activity. She was advised that she should start slow with the use of weights due to possibility of muscle soreness where pocket for ICD is healing. All questions answered.

## 2020-06-25 ENCOUNTER — Ambulatory Visit (INDEPENDENT_AMBULATORY_CARE_PROVIDER_SITE_OTHER): Payer: BC Managed Care – PPO | Admitting: *Deleted

## 2020-06-25 DIAGNOSIS — I469 Cardiac arrest, cause unspecified: Secondary | ICD-10-CM | POA: Diagnosis not present

## 2020-06-26 LAB — CUP PACEART REMOTE DEVICE CHECK
Battery Remaining Longevity: 96 mo
Battery Remaining Percentage: 94 %
Battery Voltage: 3.02 V
Brady Statistic RV Percent Paced: 1 %
Date Time Interrogation Session: 20210907020200
HighPow Impedance: 77 Ohm
Implantable Lead Implant Date: 20210603
Implantable Lead Location: 753860
Implantable Pulse Generator Implant Date: 20210603
Lead Channel Impedance Value: 400 Ohm
Lead Channel Pacing Threshold Amplitude: 0.75 V
Lead Channel Pacing Threshold Pulse Width: 0.5 ms
Lead Channel Sensing Intrinsic Amplitude: 12 mV
Lead Channel Setting Pacing Amplitude: 3.5 V
Lead Channel Setting Pacing Pulse Width: 0.5 ms
Lead Channel Setting Sensing Sensitivity: 0.5 mV
Pulse Gen Serial Number: 111007674

## 2020-06-27 ENCOUNTER — Ambulatory Visit (INDEPENDENT_AMBULATORY_CARE_PROVIDER_SITE_OTHER): Payer: BC Managed Care – PPO | Admitting: Internal Medicine

## 2020-06-27 ENCOUNTER — Other Ambulatory Visit: Payer: Self-pay

## 2020-06-27 VITALS — BP 112/72 | HR 73 | Ht 67.0 in | Wt 135.4 lb

## 2020-06-27 DIAGNOSIS — I469 Cardiac arrest, cause unspecified: Secondary | ICD-10-CM | POA: Diagnosis not present

## 2020-06-27 DIAGNOSIS — Z95810 Presence of automatic (implantable) cardiac defibrillator: Secondary | ICD-10-CM | POA: Diagnosis not present

## 2020-06-27 LAB — CUP PACEART INCLINIC DEVICE CHECK
Battery Remaining Longevity: 103 mo
Brady Statistic RV Percent Paced: 0 %
Date Time Interrogation Session: 20210909172224
HighPow Impedance: 72 Ohm
Implantable Lead Implant Date: 20210603
Implantable Lead Location: 753860
Implantable Pulse Generator Implant Date: 20210603
Lead Channel Impedance Value: 437.5 Ohm
Lead Channel Pacing Threshold Amplitude: 0.75 V
Lead Channel Pacing Threshold Amplitude: 0.75 V
Lead Channel Pacing Threshold Pulse Width: 0.5 ms
Lead Channel Pacing Threshold Pulse Width: 0.5 ms
Lead Channel Sensing Intrinsic Amplitude: 12 mV
Lead Channel Setting Pacing Amplitude: 2.5 V
Lead Channel Setting Pacing Pulse Width: 0.5 ms
Lead Channel Setting Sensing Sensitivity: 0.5 mV
Pulse Gen Serial Number: 111007674

## 2020-06-27 NOTE — Progress Notes (Signed)
Remote ICD transmission.   

## 2020-06-27 NOTE — Progress Notes (Signed)
HPI Allison Cannon returns today for followup. She is a pleasant 64 yo woman with a h/o VF arrest s/p ICD insertion. She has done well in the interim, with no chest pain or sob. No syncope. No edema. She is exercising with no limit. No Known Allergies   Current Outpatient Medications  Medication Sig Dispense Refill  . acetaminophen (TYLENOL) 325 MG tablet Take 1-2 tablets (325-650 mg total) by mouth every 4 (four) hours as needed for mild pain. 10 tablet 0  . diphenhydrAMINE (BENADRYL) 25 MG tablet Take 25 mg by mouth every 6 (six) hours as needed for allergies (cough).    . diphenhydramine-acetaminophen (TYLENOL PM) 25-500 MG TABS tablet Take 1 tablet by mouth at bedtime as needed (pain).    Marland Kitchen ibuprofen (ADVIL) 200 MG tablet Take 200 mg by mouth every 6 (six) hours as needed for moderate pain.     No current facility-administered medications for this visit.     Past Medical History:  Diagnosis Date  . Arthritis of left hip     ROS:   All systems reviewed and negative except as noted in the HPI.   Past Surgical History:  Procedure Laterality Date  . CHOLECYSTECTOMY    . ICD IMPLANT N/A 03/21/2020   Procedure: ICD IMPLANT;  Surgeon: Evans Lance, MD;  Location: [facility];  Service: Cardiovascular;  Laterality: N/A;  . LEFT HEART CATH AND CORONARY ANGIOGRAPHY N/A 03/21/2020   Procedure: LEFT HEART CATH AND CORONARY ANGIOGRAPHY;  Surgeon: Troy Sine, MD;  Location: [facility];  Service: Cardiovascular;  Laterality: N/A;  . SP CHOLECYSTOMY       Family History  Problem Relation Age of Onset  . Stroke Mother   . Stroke Father   . Cancer Mother   . Cancer Sister      Social History   Socioeconomic History  . Marital status: Married    Spouse name: Not on file  . Number of children: Not on file  . Years of education: Not on file  . Highest education level: Not on file  Occupational History  . Not on file  Tobacco Use  . Smoking status:  Never Smoker  . Smokeless tobacco: Never Used  Substance and Sexual Activity  . Alcohol use: Not Currently  . Drug use: Never  . Sexual activity: Not on file  Other Topics Concern  . Not on file  Social History Narrative   ** Merged History Encounter **       Social Determinants of Health   Financial Resource Strain:   . Difficulty of Paying Living Expenses: Not on file  Food Insecurity:   . Worried About Charity fundraiser in the Last Year: Not on file  . Ran Out of Food in the Last Year: Not on file  Transportation Needs:   . Lack of Transportation (Medical): Not on file  . Lack of Transportation (Non-Medical): Not on file  Physical Activity:   . Days of Exercise per Week: Not on file  . Minutes of Exercise per Session: Not on file  Stress:   . Feeling of Stress : Not on file  Social Connections:   . Frequency of Communication with Friends and Family: Not on file  . Frequency of Social Gatherings with Friends and Family: Not on file  . Attends Religious Services: Not on file  . Active Member of Clubs or Organizations: Not on file  . Attends Club or  Organization Meetings: Not on file  . Marital Status: Not on file  Intimate Partner Violence:   . Fear of Current or Ex-Partner: Not on file  . Emotionally Abused: Not on file  . Physically Abused: Not on file  . Sexually Abused: Not on file     BP 112/72   Pulse 73   Ht 5\' 7"  (1.702 m)   Wt 135 lb 6.4 oz (61.4 kg)   SpO2 97%   BMI 21.21 kg/m   Physical Exam:  Well appearing NAD HEENT: Unremarkable Neck:  No JVD, no thyromegally Lymphatics:  No adenopathy Back:  No CVA tenderness Lungs:  Clear with no wheezes HEART:  Regular rate rhythm, no murmurs, no rubs, no clicks Abd:  soft, positive bowel sounds, no organomegally, no rebound, no guarding Ext:  2 plus pulses, no edema, no cyanosis, no clubbing Skin:  No rashes no nodules Neuro:  CN II through XII intact, motor grossly intact  EKG - nsr  DEVICE    Normal device function.  See PaceArt for details.   Assess/Plan: 1. VF - she has not had any additional episodes over the past 3 months. The etiology remains unclear. 2. ICD - her St. Jude single chamber ICD is working normally. We will recheck in several months.  Allison Overlie Samanda Buske,MD

## 2020-06-27 NOTE — Patient Instructions (Signed)

## 2020-06-28 ENCOUNTER — Ambulatory Visit (INDEPENDENT_AMBULATORY_CARE_PROVIDER_SITE_OTHER): Payer: BC Managed Care – PPO | Admitting: Family Medicine

## 2020-06-28 ENCOUNTER — Encounter: Payer: Self-pay | Admitting: Family Medicine

## 2020-06-28 VITALS — BP 118/83 | HR 83 | Temp 97.3°F | Ht 67.0 in | Wt 134.0 lb

## 2020-06-28 DIAGNOSIS — Z1159 Encounter for screening for other viral diseases: Secondary | ICD-10-CM | POA: Diagnosis not present

## 2020-06-28 DIAGNOSIS — Z13220 Encounter for screening for lipoid disorders: Secondary | ICD-10-CM | POA: Diagnosis not present

## 2020-06-28 DIAGNOSIS — R7989 Other specified abnormal findings of blood chemistry: Secondary | ICD-10-CM | POA: Diagnosis not present

## 2020-06-28 NOTE — Patient Instructions (Signed)
° ° ° °  If you have lab work done today you will be contacted with your lab results within the next 2 weeks.  If you have not heard from us then please contact us. The fastest way to get your results is to register for My Chart. ° ° °IF you received an x-ray today, you will receive an invoice from [facility]. Please contact [facility] at [phone] with questions or concerns regarding your invoice.  ° °IF you received labwork today, you will receive an invoice from [facility]. Please contact [facility] at [phone] with questions or concerns regarding your invoice.  ° °Our billing staff will not be able to assist you with questions regarding bills from these companies. ° °You will be contacted with the lab results as soon as they are available. The fastest way to get your results is to activate your My Chart account. Instructions are located on the last page of this paperwork. If you have not heard from us regarding the results in 2 weeks, please contact this office. °  ° ° ° °

## 2020-06-29 LAB — LIPID PANEL
Chol/HDL Ratio: 1.9 ratio (ref 0.0–4.4)
Cholesterol, Total: 199 mg/dL (ref 100–199)
HDL: 107 mg/dL (ref >39)
LDL Chol Calc (NIH): 80 mg/dL (ref 0–99)
Triglycerides: 64 mg/dL (ref 0–149)
VLDL Cholesterol Cal: 12 mg/dL (ref 5–40)

## 2020-06-29 LAB — COMPREHENSIVE METABOLIC PANEL
ALT: 11 IU/L (ref 0–32)
AST: 16 IU/L (ref 0–40)
Albumin/Globulin Ratio: 2.8 — ABNORMAL HIGH (ref 1.2–2.2)
Albumin: 4.8 g/dL (ref 3.8–4.8)
Alkaline Phosphatase: 108 IU/L (ref 48–121)
BUN/Creatinine Ratio: 13 (ref 12–28)
BUN: 9 mg/dL (ref 8–27)
Bilirubin Total: 0.5 mg/dL (ref 0.0–1.2)
CO2: 25 mmol/L (ref 20–29)
Calcium: 9.8 mg/dL (ref 8.7–10.3)
Chloride: 104 mmol/L (ref 96–106)
Creatinine, Ser: 0.67 mg/dL (ref 0.57–1.00)
GFR calc Af Amer: 107 mL/min/{1.73_m2} (ref >59)
GFR calc non Af Amer: 93 mL/min/{1.73_m2} (ref >59)
Globulin, Total: 1.7 g/dL (ref 1.5–4.5)
Glucose: 91 mg/dL (ref 65–99)
Potassium: 4.5 mmol/L (ref 3.5–5.2)
Sodium: 142 mmol/L (ref 134–144)
Total Protein: 6.5 g/dL (ref 6.0–8.5)

## 2020-06-29 LAB — MAGNESIUM: Magnesium: 1.9 mg/dL (ref 1.6–2.3)

## 2020-06-29 LAB — HEPATITIS C ANTIBODY: Hep C Virus Ab: <0.1 s/co ratio (ref 0.0–0.9)

## 2020-07-01 ENCOUNTER — Encounter: Payer: Self-pay | Admitting: Family Medicine

## 2020-07-03 NOTE — Addendum Note (Signed)
Addended by: Rose Phi on: 07/03/2020 04:59 PM   Modules accepted: Orders

## 2020-07-23 ENCOUNTER — Telehealth: Payer: Self-pay

## 2020-07-23 NOTE — Telephone Encounter (Signed)
Returning patient phone call.  It is fine for patient to have xray tomorrow.   Patient reports today her magnesium levels are 1.9. Patient would like to know if she should change her Magnesium medication? Currently taking magnesium 800 mg daily.

## 2020-07-23 NOTE — Telephone Encounter (Signed)
Sent [employer].  Magnesium WNL.

## 2020-07-23 NOTE — Telephone Encounter (Signed)
The pt states she is having a dental x-ray tomorrow and wants to know if that is okay. I told her the nurse will give her a call back at [phone].

## 2020-07-25 NOTE — Patient Instructions (Addendum)
Preventive Care 65-64 Years Old, Female Preventive care refers to visits with your health care provider and lifestyle choices that can promote health and wellness. This includes:  A yearly physical exam. This may also be called an annual well check.  Regular dental visits and eye exams.  Immunizations.  Screening for certain conditions.  Healthy lifestyle choices, such as eating a healthy diet, getting regular exercise, not using drugs or products that contain nicotine and tobacco, and limiting alcohol use. What can I expect for my preventive care visit? Physical exam Your health care provider will check your:  Height and weight. This may be used to calculate body mass index (BMI), which tells if you are at a healthy weight.  Heart rate and blood pressure.  Skin for abnormal spots. Counseling Your health care provider may ask you questions about your:  Alcohol, tobacco, and drug use.  Emotional well-being.  Home and relationship well-being.  Sexual activity.  Eating habits.  Work and work environment.  Method of birth control.  Menstrual cycle.  Pregnancy history. What immunizations do I need?  Influenza (flu) vaccine  This is recommended every year. Tetanus, diphtheria, and pertussis (Tdap) vaccine  You may need a Td booster every 10 years. Varicella (chickenpox) vaccine  You may need this if you have not been vaccinated. Zoster (shingles) vaccine  You may need this after age 65. Measles, mumps, and rubella (MMR) vaccine  You may need at least one dose of MMR if you were born in 1957 or later. You may also need a second dose. Pneumococcal conjugate (PCV13) vaccine  You may need this if you have certain conditions and were not previously vaccinated. Pneumococcal polysaccharide (PPSV23) vaccine  You may need one or two doses if you smoke cigarettes or if you have certain conditions. Meningococcal conjugate (MenACWY) vaccine  You may need this if you  have certain conditions. Hepatitis A vaccine  You may need this if you have certain conditions or if you travel or work in places where you may be exposed to hepatitis A. Hepatitis B vaccine  You may need this if you have certain conditions or if you travel or work in places where you may be exposed to hepatitis B. Haemophilus influenzae type b (Hib) vaccine  You may need this if you have certain conditions. Human papillomavirus (HPV) vaccine  If recommended by your health care provider, you may need three doses over 6 months. You may receive vaccines as individual doses or as more than one vaccine together in one shot (combination vaccines). Talk with your health care provider about the risks and benefits of combination vaccines. What tests do I need? Blood tests  Lipid and cholesterol levels. These may be checked every 5 years, or more frequently if you are over 50 years old.  Hepatitis C test.  Hepatitis B test. Screening  Lung cancer screening. You may have this screening every year starting at age 65 if you have a 30-pack-year history of smoking and currently smoke or have quit within the past 15 years.  Colorectal cancer screening. All adults should have this screening starting at age 65 and continuing until age 75. Your health care provider may recommend screening at age 45 if you are at increased risk. You will have tests every 1-10 years, depending on your results and the type of screening test.  Diabetes screening. This is done by checking your blood sugar (glucose) after you have not eaten for a while (fasting). You may have this   done every 1-3 years.  Mammogram. This may be done every 1-2 years. Talk with your health care provider about when you should start having regular mammograms. This may depend on whether you have a family history of breast cancer.  BRCA-related cancer screening. This may be done if you have a family history of breast, ovarian, tubal, or peritoneal  cancers.  Pelvic exam and Pap test. This may be done every 3 years starting at age 65. Starting at age 65, this may be done every 5 years if you have a Pap test in combination with an HPV test. Other tests  Sexually transmitted disease (STD) testing.  Bone density scan. This is done to screen for osteoporosis. You may have this scan if you are at high risk for osteoporosis. Follow these instructions at home: Eating and drinking  Eat a diet that includes fresh fruits and vegetables, whole grains, lean protein, and low-fat dairy.  Take vitamin and mineral supplements as recommended by your health care provider.  Do not drink alcohol if: ? Your health care provider tells you not to drink. ? You are pregnant, may be pregnant, or are planning to become pregnant.  If you drink alcohol: ? Limit how much you have to 0-1 drink a day. ? Be aware of how much alcohol is in your drink. In the [place] one drink equals one 12 oz bottle of beer (355 mL), one 5 oz glass of wine (148 mL), or one 1 oz glass of hard liquor (44 mL). Lifestyle  Take daily care of your teeth and gums.  Stay active. Exercise for at least 30 minutes on 5 or more days each week.  Do not use any products that contain nicotine or tobacco, such as cigarettes, e-cigarettes, and chewing tobacco. If you need help quitting, ask your health care provider.  If you are sexually active, practice safe sex. Use a condom or other form of birth control (contraception) in order to prevent pregnancy and STIs (sexually transmitted infections).  If told by your health care provider, take low-dose aspirin daily starting at age 65. What's next?  Visit your health care provider once a year for a well check visit.  Ask your health care provider how often you should have your eyes and teeth checked.  Stay up to date on all vaccines. This information is not intended to replace advice given to you by your health care provider. Make sure you  discuss any questions you have with your health care provider. Document Revised: 06/16/2018 Document Reviewed: 06/16/2018 Elsevier Patient Education  2020 [place].  Ms. Perezgarcia , Thank you for taking time to come for your Medicare Wellness Visit. I appreciate your ongoing commitment to your health goals. Please review the following plan we discussed and let me know if I can assist you in the future.   These are the goals we discussed:  You can get your Tdap at your pharmacy.   Call [facility] and schedule bone density with your mammogram.     This is a list of the screening recommended for you and due dates:  Health Maintenance  Topic Date Due  . COVID-19 Vaccine (1) Never done  . Tetanus Vaccine  Never done  . Pap Smear  Never done  . Mammogram  Never done  . Colon Cancer Screening  Never done  . Flu Shot  Completed  .  Hepatitis C: One time screening is recommended by Center for Disease Control  (CDC) for  adults born  from [place] through 1965.   Completed  . HIV Screening  Completed

## 2020-07-26 ENCOUNTER — Ambulatory Visit (INDEPENDENT_AMBULATORY_CARE_PROVIDER_SITE_OTHER): Payer: Medicare Other | Admitting: Family Medicine

## 2020-07-26 ENCOUNTER — Other Ambulatory Visit: Payer: Self-pay

## 2020-07-26 ENCOUNTER — Encounter: Payer: Self-pay | Admitting: Family Medicine

## 2020-07-26 VITALS — BP 130/70 | HR 75 | Ht 67.25 in | Wt 135.0 lb

## 2020-07-26 DIAGNOSIS — Z0000 Encounter for general adult medical examination without abnormal findings: Secondary | ICD-10-CM

## 2020-07-26 DIAGNOSIS — E2839 Other primary ovarian failure: Secondary | ICD-10-CM

## 2020-07-26 DIAGNOSIS — M816 Localized osteoporosis [Lequesne]: Secondary | ICD-10-CM | POA: Diagnosis not present

## 2020-07-26 NOTE — Progress Notes (Signed)
Allison Cannon is a 64 y.o. female who presents Welcome to Medicare visit and follow-up on chronic medical conditions.  She has the following concerns  Hx of fracture of bones in her feet with osteoporosis. Is not in favor of medication for this but reports getting adequate calcium in her diet and taking vitamin D supplements. She does weight bearing exercise regularly.   Hx of cardiac arrest in 03/2020 and now has ICD. No explanation found on studies. Followed by cardiologist.   Low magnesium and is on supplement.    Health maintenance:  Mammogram: 07/2017 and has upcoming one  Colonoscopy: 8 years ago at [facility]. Clear and 10 year recall per patient.  Sister with colon cancer.  Married, retired and no kids.  Last Gynecological Exam: last pap 5 years no obgyn Last Dental Exam: Dr. Randol Kern. Recent  Last Eye Exam:June 2020-[facility] Last DEXA: 4 1/2 years ago.   Other providers-  Dr. [name]- cardiologist  [facility]- [facility]    Pfizer Covid vaccine dates: March 23 and April 13  Tdap - unknown  Flu shot today    Immunization History  Administered Date(s) Administered  . Influenza-Unspecified 06/27/2020      Depression screen:  See questionnaire below.  Depression screen [facility] 2/9 07/26/2020 06/28/2020 06/10/2016  Decreased Interest 0 0 0  Down, Depressed, Hopeless 0 0 0  PHQ - 2 Score 0 0 0    Fall Risk Screen: see questionnaire below. Fall Risk  07/26/2020 06/28/2020 06/10/2016  Falls in the past year? 1 0 No  Number falls in past yr: 1 - -  Injury with Fall? 1 - -  Follow up - Falls evaluation completed -    ADL screen:  See questionnaire below Functional Status Survey: Is the patient deaf or have difficulty hearing?: No Does the patient have difficulty seeing, even when wearing glasses/contacts?: No Does the patient have difficulty concentrating, remembering, or making decisions?: No Does the patient have difficulty walking or climbing stairs?: No Does  the patient have difficulty dressing or bathing?: No Does the patient have difficulty doing errands alone such as visiting a doctor's office or shopping?: No   End of Life Discussion:  Patient has a living will and medical power of attorney. MOST form reviewed.  Husband Allison Cannon is her HCPOA     Review of Systems Constitutional: -fever, -chills, -sweats, -unexpected weight change, -anorexia, -fatigue Allergy: -sneezing, -itching, -congestion Dermatology: denies changing moles, rash ENT: -runny nose, -ear pain, -sore throat, -hoarseness, -sinus pain, -teeth pain, -tinnitus, -hearing loss, -epistaxis Cardiology:  -chest pain, -palpitations, -edema, -orthopnea, -paroxysmal nocturnal dyspnea Respiratory: -cough, -shortness of breath, -dyspnea on exertion, -wheezing, -hemoptysis Gastroenterology: -abdominal pain, -nausea, -vomiting, -diarrhea, -constipation, -blood in stool, -changes in bowel movement, -dysphagia Hematology: -bleeding or bruising problems Musculoskeletal: -arthralgias, -myalgias, -joint swelling, -back pain, -neck pain, -cramping, -gait changes Ophthalmology: -vision changes, -eye redness, -itching, -discharge Urology: -dysuria, -difficulty urinating, -hematuria, -urinary frequency, -urgency, - incontinence Neurology: -headache, -weakness, -tingling, -numbness, -speech abnormality, -memory loss, -falls, -dizziness Psychology:  -depressed mood, -agitation, -sleep problems    PHYSICAL EXAM:  BP 130/70   Pulse 75   Ht 5' 7.25" (1.708 m)   Wt 135 lb (61.2 kg)   SpO2 97%   BMI 20.99 kg/m   General Appearance: Alert, cooperative, no distress, appears stated age Head: Normocephalic, without obvious abnormality, atraumatic Eyes: PERRL, conjunctiva/corneas clear, EOM's intact Ears: Normal TM's and external ear canals Nose: Mask on  Throat: Mask on  Neck: Supple,  no lymphadenopathy; thyroid: no enlargement/tenderness/nodules; no JVD Back: Spine nontender, no curvature,  ROM normal, no CVA tenderness Lungs: Clear to auscultation bilaterally without wheezes, rales or ronchi; respirations unlabored Chest Wall: No tenderness Heart: Regular rate and rhythm Breast Exam: declines  Abdomen: Soft, non-tender, nondistended, normoactive bowel sounds, no masses, no hepatosplenomegaly Genitalia: declines  Extremities: No clubbing, cyanosis or edema Pulses: 2+ and symmetric all extremities Skin: Skin color, texture, turgor normal, no rashes or lesions Lymph nodes: Cervical, supraclavicular, and axillary nodes normal Neurologic: CNII-XII intact, normal strength, sensation and gait Psych: Normal mood, affect, hygiene and grooming.  ASSESSMENT/PLAN: Welcome to Medicare preventive visit  Localized osteoporosis without current pathological fracture  Estrogen deficiency   She is new to the practice and here to establish care. She is also here for a welcome to Medicare visit.  Labs done at her previous PCP visit reviewed.  Reports being in her usual state of health. She did have cardiac arrest in June and etiology not found. She now has ICD and followed by cardiology.  In good spirits. No issues with depression, memory or difficulties with ADLs.  Advance directives discussed. Immunizations reviewed. Covid vaccines received. Tdap due and she will get this at her local pharmacy.  Preventive healthcare reviewed. Declines pap smear today. Will schedule DEXA with her next mammogram at [facility]. Order provided. Reports hx of osteoporosis and declines medication. Will continue with calcium, vitamin D and weight bearing exercise and we will see how her DEXA scan looks.  Reports last colonoscopy done 8 years ago in [place] and states she is due for 10 year recall. Her sister does have Hx of colon cancer which increases her risk.  She would prefer one year follow up but advised she may change her mind and come in for recheck and labs in 6 months or sooner if needed.     Discussed  monthly self breast exams and yearly mammograms; at least 30 minutes of aerobic activity at least 5 days/week and weight-bearing exercise 2x/week; proper sunscreen use reviewed; healthy diet, including goals of calcium and vitamin D intake and alcohol recommendations (less than or equal to 1 drink/day) reviewed; regular seatbelt use; changing batteries in smoke detectors.  Immunization recommendations discussed.  Colonoscopy recommendations reviewed   Medicare Attestation I have personally reviewed: The patient's medical and social history Their use of alcohol, tobacco or illicit drugs Their current medications and supplements The patient's functional ability including ADLs,fall risks, home safety risks, cognitive, and hearing and visual impairment Diet and physical activities Evidence for depression or mood disorders  The patient's weight, height, and BMI have been recorded in the chart.  I have made referrals, counseling, and provided education to the patient based on review of the above and I have provided the patient with a written personalized care plan for preventive services.     Harland Dingwall, NP-C   07/28/2020

## 2020-07-27 IMAGING — CT CT ANGIO CHEST
2 of 6 series · 19 of 46 positions shown · IV contrast (omnipaque)
Comparison: Chest x-ray dated 03/19/2020

CLINICAL DATA: Cardiac arrest.

EXAM:
CT ANGIOGRAPHY CHEST WITH CONTRAST
TECHNIQUE: Multidetector CT imaging of the chest was performed using the
standard protocol during bolus administration of intravenous
contrast. Multiplanar CT image reconstructions and MIPs were
obtained to evaluate the vascular anatomy.
CONTRAST:  65mL OMNIPAQUE IOHEXOL 350 MG/ML SOLN

[Series 6: thins · axial · 0.65mm/px · z∈[-180,+96]mm · 16 of 304 slices shown]
[im 14/304  lung]
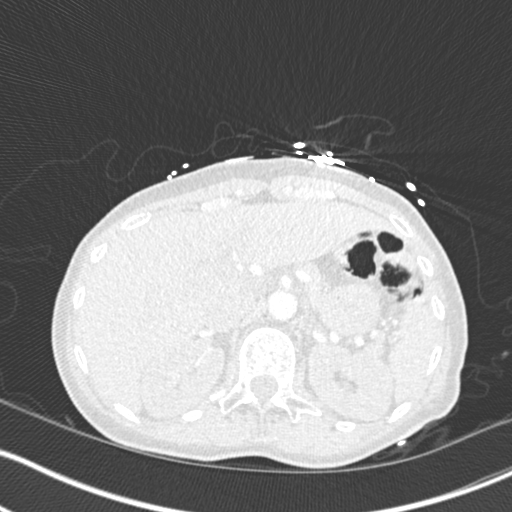
[im 40/304  soft-tissue]
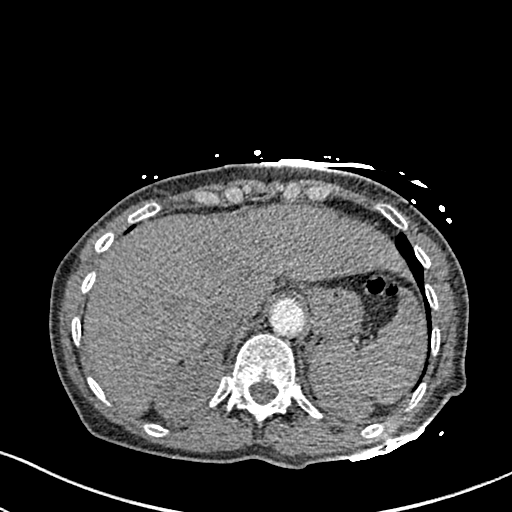
[im 53/304  lung]
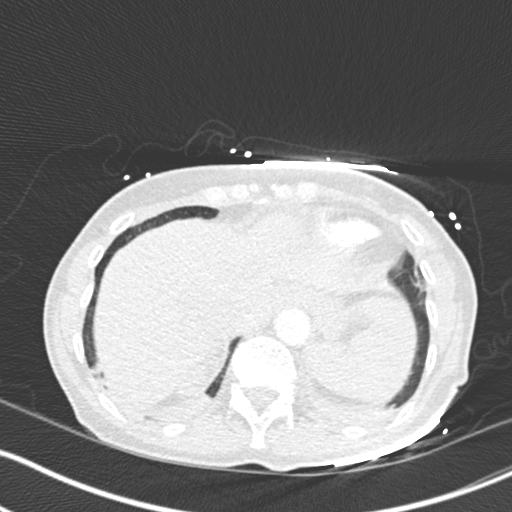
[im 66/304  soft-tissue]
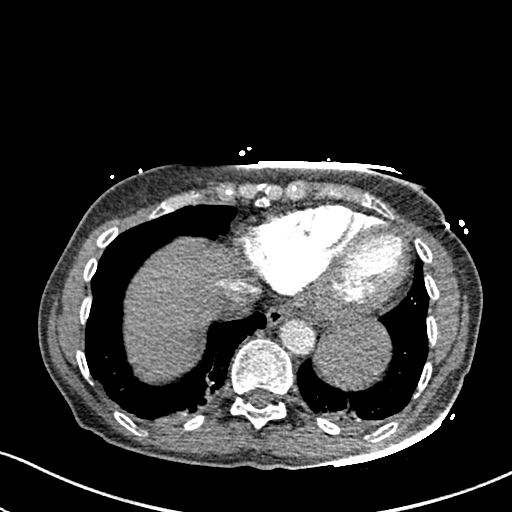
[im 93/304  lung]
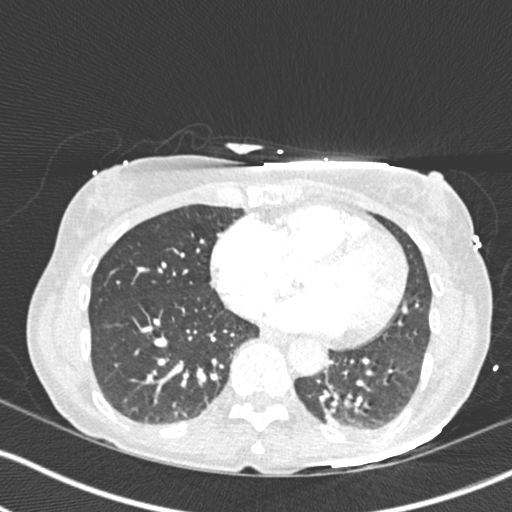
[im 106/304  soft-tissue]
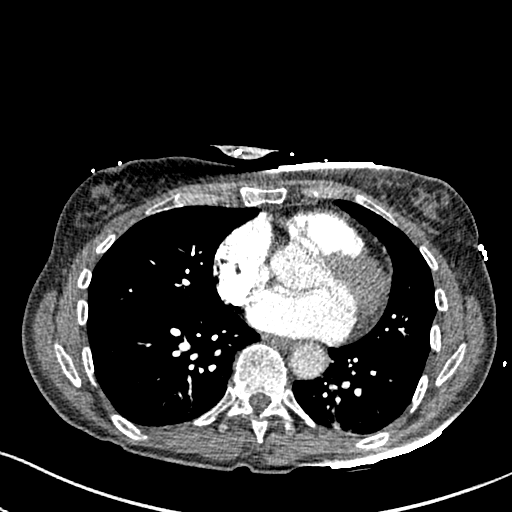
[im 119/304  lung]
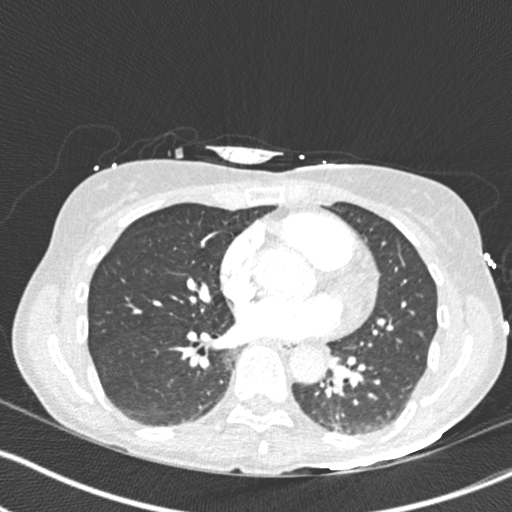
[im 145/304  soft-tissue]
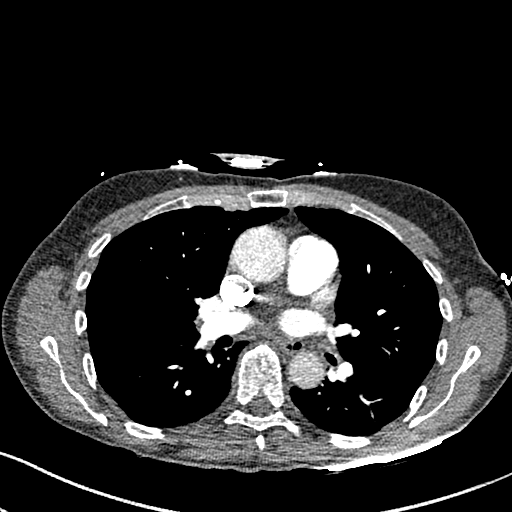
[im 159/304  lung]
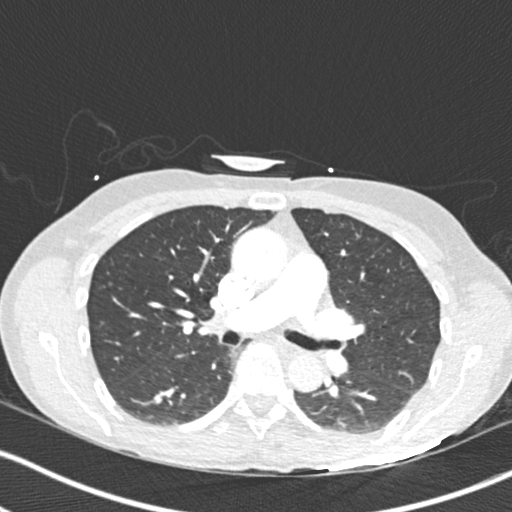
[im 185/304  soft-tissue]
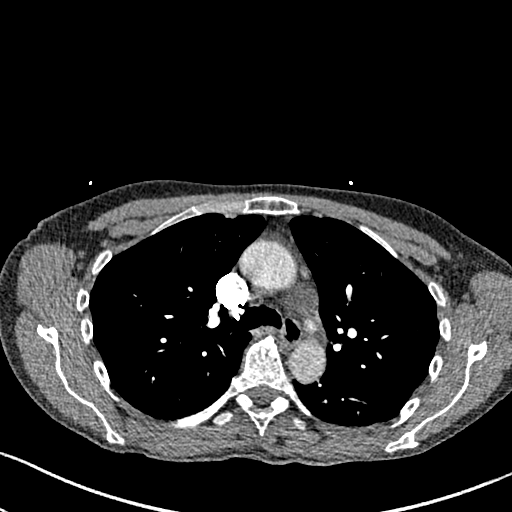
[im 198/304  lung]
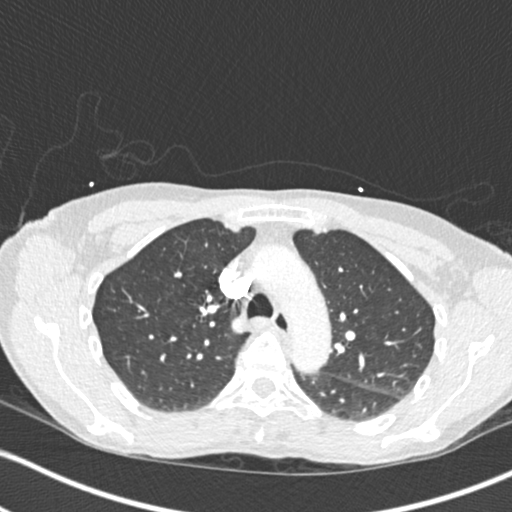
[im 211/304  soft-tissue]
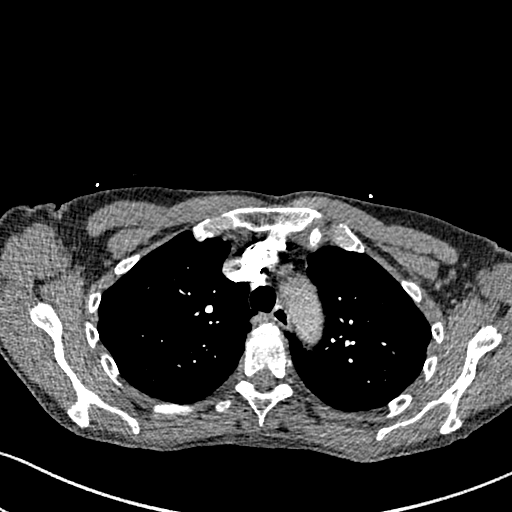
[im 238/304  lung]
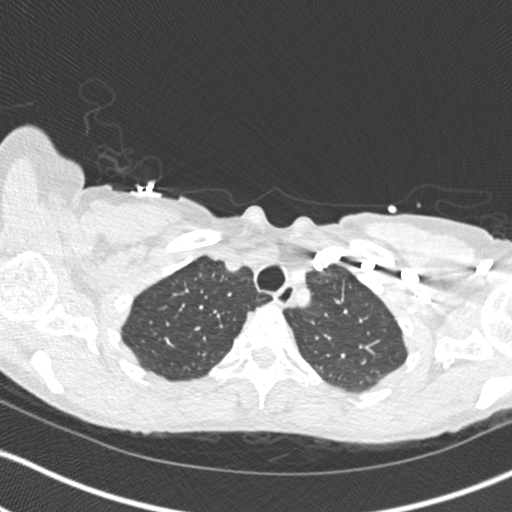
[im 251/304  soft-tissue]
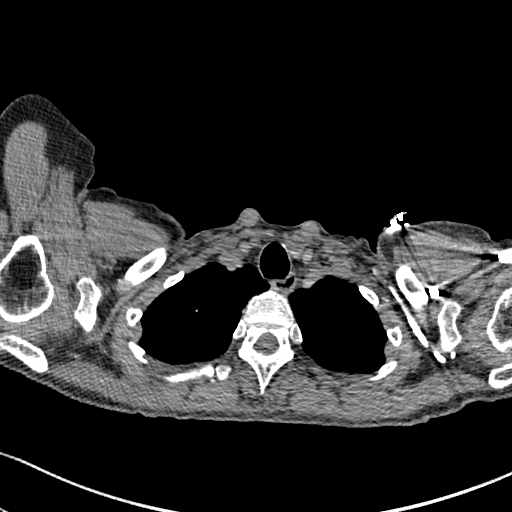
[im 264/304  lung]
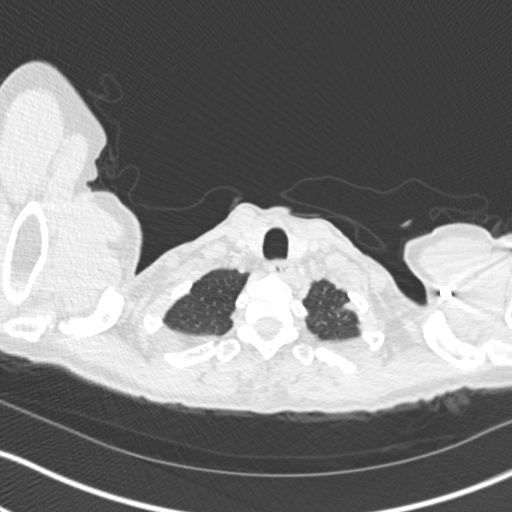
[im 290/304  soft-tissue]
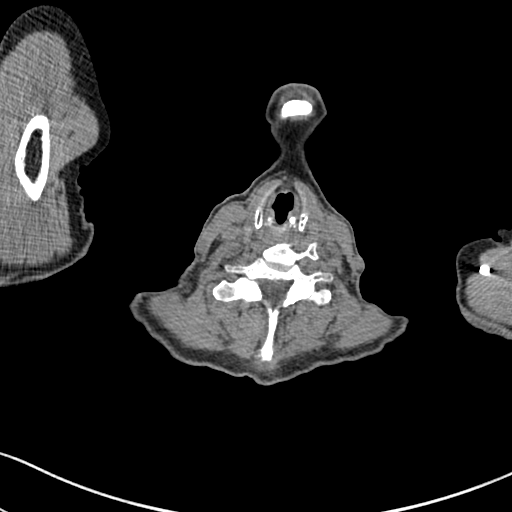

[Series 8: coronal mpr · coronal · 0.56mm/px · 3 of 118 slices shown]
[im 30/118  soft-tissue]
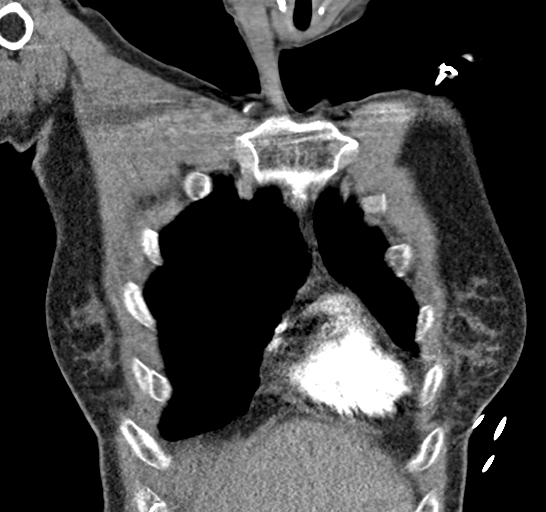
[im 59/118  soft-tissue]
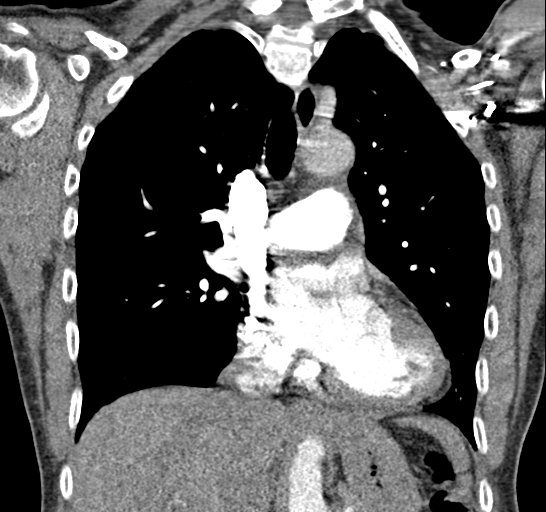
[im 88/118  soft-tissue]
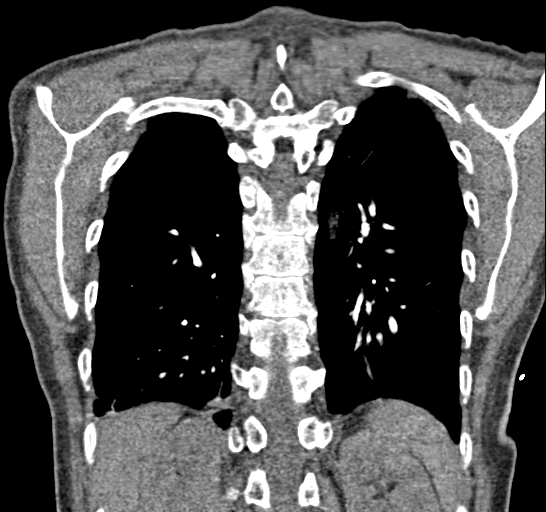

[19 of 46 positions shown; findings below may reference images not displayed]

FINDINGS: Cardiovascular: Satisfactory opacification of the pulmonary arteries
to the segmental level. No evidence of pulmonary embolism. Normal
heart size. No pericardial effusion.

Mediastinum/Nodes: No enlarged mediastinal, hilar, or axillary lymph
nodes. Thyroid gland, trachea, and esophagus demonstrate no
significant findings.

Lungs/Pleura: Slight atelectasis at the lung bases posteriorly.
Otherwise normal. No effusions.

Upper Abdomen: Normal.

Musculoskeletal: Normal.

Review of the MIP images confirms the above findings.
IMPRESSION: No pulmonary emboli. Slight atelectasis at the lung bases
posteriorly. Otherwise, normal exam.

## 2020-07-28 DIAGNOSIS — M816 Localized osteoporosis [Lequesne]: Secondary | ICD-10-CM | POA: Insufficient documentation

## 2020-07-28 DIAGNOSIS — M810 Age-related osteoporosis without current pathological fracture: Secondary | ICD-10-CM | POA: Insufficient documentation

## 2020-07-28 DIAGNOSIS — E2839 Other primary ovarian failure: Secondary | ICD-10-CM | POA: Insufficient documentation

## 2020-07-29 ENCOUNTER — Telehealth: Payer: Self-pay | Admitting: Family Medicine

## 2020-07-29 NOTE — Telephone Encounter (Signed)
Pt returned call and I advised of 5 year recall for colon cancer screening. States she will call and schedule for January. I also discussed checking her magnesium level since she is taking a high dose supplement. She will come in for labs and potentially cut dose in half. Needs a BMP when she comes in for medication management and hypomagnesium.

## 2020-07-29 NOTE — Telephone Encounter (Signed)
Attempted to call patient and left message to call back. Calling to let her know that standard of care for colon cancer screening is to have a colonoscopy every 5 years when a first degree relative has history of colon cancer. I usually refer to [facility] but can refer her to any GI she prefers. Let me know please.

## 2020-09-24 ENCOUNTER — Ambulatory Visit (INDEPENDENT_AMBULATORY_CARE_PROVIDER_SITE_OTHER): Payer: Medicare Other

## 2020-09-24 DIAGNOSIS — I469 Cardiac arrest, cause unspecified: Secondary | ICD-10-CM | POA: Diagnosis not present

## 2020-09-24 LAB — CUP PACEART REMOTE DEVICE CHECK
Battery Remaining Longevity: 101 mo
Battery Remaining Percentage: 93 %
Battery Voltage: 3.01 V
Brady Statistic RV Percent Paced: 1 %
Date Time Interrogation Session: 20211207010033
HighPow Impedance: 73 Ohm
Implantable Lead Implant Date: 20210603
Implantable Lead Location: 753860
Implantable Pulse Generator Implant Date: 20210603
Lead Channel Impedance Value: 400 Ohm
Lead Channel Pacing Threshold Amplitude: 0.75 V
Lead Channel Pacing Threshold Pulse Width: 0.5 ms
Lead Channel Sensing Intrinsic Amplitude: 12 mV
Lead Channel Setting Pacing Amplitude: 2.5 V
Lead Channel Setting Pacing Pulse Width: 0.5 ms
Lead Channel Setting Sensing Sensitivity: 0.5 mV
Pulse Gen Serial Number: 111007674

## 2020-09-30 LAB — HM MAMMOGRAPHY

## 2020-10-03 ENCOUNTER — Encounter: Payer: Self-pay | Admitting: Internal Medicine

## 2020-10-07 NOTE — Progress Notes (Signed)
Remote ICD transmission.   

## 2020-12-17 ENCOUNTER — Telehealth: Payer: Self-pay

## 2020-12-17 ENCOUNTER — Other Ambulatory Visit: Payer: Self-pay | Admitting: Family Medicine

## 2020-12-17 NOTE — Telephone Encounter (Signed)
Please let her know that she can come in for labs to check her magnesium level. I ordered a basic metabolic panel. She does not have to fast but let the lab tech know if she ate prior in case her blood sugar is elevated.

## 2020-12-17 NOTE — Telephone Encounter (Signed)
Pt called to schedule appt for labs.  Said you wanted her magnesium level checked and she wasn't sure if you wanted anything else tested.  Is this OK and can you put in orders and does she need to be fasting for anything?

## 2020-12-18 NOTE — Telephone Encounter (Signed)
Pt. Scheduled on Friday

## 2020-12-20 ENCOUNTER — Other Ambulatory Visit: Payer: Medicare Other

## 2020-12-20 ENCOUNTER — Other Ambulatory Visit: Payer: Self-pay

## 2020-12-20 LAB — BASIC METABOLIC PANEL
BUN/Creatinine Ratio: 14 (ref 12–28)
BUN: 10 mg/dL (ref 8–27)
CO2: 22 mmol/L (ref 20–29)
Calcium: 10.1 mg/dL (ref 8.7–10.3)
Chloride: 101 mmol/L (ref 96–106)
Creatinine, Ser: 0.69 mg/dL (ref 0.57–1.00)
Glucose: 90 mg/dL (ref 65–99)
Potassium: 4.7 mmol/L (ref 3.5–5.2)
Sodium: 141 mmol/L (ref 134–144)
eGFR: 96 mL/min/{1.73_m2} (ref >59)

## 2020-12-21 ENCOUNTER — Encounter: Payer: Self-pay | Admitting: Family Medicine

## 2020-12-21 NOTE — Telephone Encounter (Signed)
done

## 2020-12-22 NOTE — Progress Notes (Signed)
Please ask Verdis Frederickson to add on a magnesium level due to hypomagnesemia.

## 2020-12-24 ENCOUNTER — Encounter: Payer: Self-pay | Admitting: Family Medicine

## 2020-12-24 ENCOUNTER — Ambulatory Visit (INDEPENDENT_AMBULATORY_CARE_PROVIDER_SITE_OTHER): Payer: Medicare Other

## 2020-12-24 DIAGNOSIS — Z95810 Presence of automatic (implantable) cardiac defibrillator: Secondary | ICD-10-CM

## 2020-12-24 DIAGNOSIS — I469 Cardiac arrest, cause unspecified: Secondary | ICD-10-CM

## 2020-12-24 LAB — MAGNESIUM: Magnesium: 1.9 mg/dL (ref 1.6–2.3)

## 2020-12-24 LAB — SPECIMEN STATUS REPORT

## 2020-12-25 LAB — CUP PACEART REMOTE DEVICE CHECK
Battery Remaining Longevity: 98 mo
Battery Remaining Percentage: 90 %
Battery Voltage: 2.99 V
Brady Statistic RV Percent Paced: 1 %
Date Time Interrogation Session: 20220308022048
HighPow Impedance: 77 Ohm
Implantable Lead Implant Date: 20210603
Implantable Lead Location: 753860
Implantable Pulse Generator Implant Date: 20210603
Lead Channel Impedance Value: 410 Ohm
Lead Channel Pacing Threshold Amplitude: 0.75 V
Lead Channel Pacing Threshold Pulse Width: 0.5 ms
Lead Channel Sensing Intrinsic Amplitude: 12 mV
Lead Channel Setting Pacing Amplitude: 2.5 V
Lead Channel Setting Pacing Pulse Width: 0.5 ms
Lead Channel Setting Sensing Sensitivity: 0.5 mV
Pulse Gen Serial Number: 111007674

## 2021-01-01 NOTE — Progress Notes (Signed)
Remote ICD transmission.   

## 2021-01-13 ENCOUNTER — Telehealth: Payer: Self-pay | Admitting: *Deleted

## 2021-01-13 NOTE — Telephone Encounter (Signed)
   [facility] Pre-operative Risk Assessment    HEARTCARE STAFF: - Please ensure there is not already an duplicate clearance open for this procedure. - Under Visit Info/Reason for Call, type in Other and utilize the format Clearance MM/DD/YY or Clearance TBD. Do not use dashes or single digits. - If request is for dental extraction, please clarify the # of teeth to be extracted.  Request for surgical clearance:  1. What type of surgery is being performed? LEFT TOTAL HIP ARTHROPLASTY   2. When is this surgery scheduled? 01/30/21   3. What type of clearance is required (medical clearance vs. Pharmacy clearance to hold med vs. Both)? MEDICAL  4. Are there any medications that need to be held prior to surgery and how long? NONE LISTED    5. Practice name and name of physician performing surgery? [facility]; DR. Rod Can   6. What is the office phone number? [phone]   7.   What is the office fax number? [phone]; [place]  8.   Anesthesia type (None, local, MAC, general) ? SPINAL   Julaine Hua 01/13/2021, 5:36 PM  _________________________________________________________________   (provider comments below)

## 2021-01-14 ENCOUNTER — Encounter: Payer: Self-pay | Admitting: Family Medicine

## 2021-01-14 ENCOUNTER — Ambulatory Visit (INDEPENDENT_AMBULATORY_CARE_PROVIDER_SITE_OTHER): Payer: Medicare Other | Admitting: Family Medicine

## 2021-01-14 ENCOUNTER — Ambulatory Visit: Payer: Self-pay | Admitting: Student

## 2021-01-14 ENCOUNTER — Other Ambulatory Visit: Payer: Self-pay

## 2021-01-14 VITALS — BP 120/76 | HR 76 | Wt 138.0 lb

## 2021-01-14 DIAGNOSIS — M1612 Unilateral primary osteoarthritis, left hip: Secondary | ICD-10-CM

## 2021-01-14 DIAGNOSIS — M816 Localized osteoporosis [Lequesne]: Secondary | ICD-10-CM | POA: Diagnosis not present

## 2021-01-14 DIAGNOSIS — Z01818 Encounter for other preprocedural examination: Secondary | ICD-10-CM

## 2021-01-14 LAB — POCT URINALYSIS DIP (PROADVANTAGE DEVICE)
Bilirubin, UA: NEGATIVE
Blood, UA: NEGATIVE
Glucose, UA: NEGATIVE mg/dL
Ketones, POC UA: NEGATIVE mg/dL
Leukocytes, UA: NEGATIVE
Nitrite, UA: NEGATIVE
Protein Ur, POC: NEGATIVE mg/dL
Specific Gravity, Urine: 1.015
Urobilinogen, Ur: NEGATIVE
pH, UA: 6.5 (ref 5.0–8.0)

## 2021-01-14 NOTE — Progress Notes (Signed)
   Subjective:    Patient ID: Allison Cannon, female    DOB: 09-17-1956, 65 y.o.   MRN: [id number]  HPI Chief Complaint  Patient presents with  . surgery clearance    Surgery clearance April 14 on hip. Cardiology will clear her as well   She is here for a surgical clearance.  Reports having a left total hip replacement surgery scheduled for April 14. 2022. Dr. Lyla Glassing is her surgeon.   A surgical clearance form was faxed to me along with requested labs to be done prior to her surgery.   Denies any concerns today and is looking forward to having her surgery.   Denies fever, chills, dizziness, chest pain, palpitations, shortness of breath, abdominal pain, N/V/D, urinary symptoms, LE edema.  No bleeding or bruising problems and she is not on an anticoagulant.    States she has a history of nausea with general anesthesia and did better with twilight sleep.   She also sees cardiology and will see them for cardiac clearance prior to surgery.   Reviewed allergies, medications, past medical, surgical, family, and social history.   Review of Systems Pertinent positives and negatives in the history of present illness.     Objective:   Physical Exam Constitutional:      Appearance: Normal appearance. She is not ill-appearing.  HENT:     Mouth/Throat:     Pharynx: Oropharynx is clear. Uvula midline.  Eyes:     Conjunctiva/sclera: Conjunctivae normal.  Cardiovascular:     Rate and Rhythm: Normal rate and regular rhythm.     Pulses: Normal pulses.     Heart sounds: Normal heart sounds.  Pulmonary:     Effort: Pulmonary effort is normal.     Breath sounds: Normal breath sounds.  Musculoskeletal:     Cervical back: Normal range of motion and neck supple.  Skin:    General: Skin is warm and dry.  Neurological:     General: No focal deficit present.     Mental Status: She is alert and oriented to person, place, and time.  Psychiatric:        Mood and Affect: Mood normal.         Thought Content: Thought content normal.    BP 120/76   Pulse 76   Wt 138 lb (62.6 kg)   BMI 21.45 kg/m       Assessment & Plan:  Pre-operative clearance - Plan: CBC with Differential/Platelet, Comprehensive metabolic panel, Protime-INR, POCT Urinalysis DIP (Proadvantage Device), Hemoglobin A1c  Primary osteoarthritis of left hip  Localized osteoporosis without current pathological fracture  She is here today for a preop clearance.  Labs ordered per request by her surgeon.  She is aware that her insurance may not cover hemoglobin A1c or PT/INR and she is fine with that. In my opinion, she will do quite well with her upcoming surgery.  She will need clearance from her cardiologist as well.  We will fax the form back to her surgeon

## 2021-01-15 LAB — COMPREHENSIVE METABOLIC PANEL
ALT: 16 IU/L (ref 0–32)
AST: 20 IU/L (ref 0–40)
Albumin/Globulin Ratio: 2.6 — ABNORMAL HIGH (ref 1.2–2.2)
Albumin: 4.7 g/dL (ref 3.8–4.8)
Alkaline Phosphatase: 109 IU/L (ref 44–121)
BUN/Creatinine Ratio: 16 (ref 12–28)
BUN: 11 mg/dL (ref 8–27)
Bilirubin Total: 0.2 mg/dL (ref 0.0–1.2)
CO2: 23 mmol/L (ref 20–29)
Calcium: 9.8 mg/dL (ref 8.7–10.3)
Chloride: 105 mmol/L (ref 96–106)
Creatinine, Ser: 0.7 mg/dL (ref 0.57–1.00)
Globulin, Total: 1.8 g/dL (ref 1.5–4.5)
Glucose: 97 mg/dL (ref 65–99)
Potassium: 4.7 mmol/L (ref 3.5–5.2)
Sodium: 143 mmol/L (ref 134–144)
Total Protein: 6.5 g/dL (ref 6.0–8.5)
eGFR: 96 mL/min/{1.73_m2} (ref >59)

## 2021-01-15 LAB — CBC WITH DIFFERENTIAL/PLATELET
Basophils Absolute: 0.1 10*3/uL (ref 0.0–0.2)
Basos: 2 %
EOS (ABSOLUTE): 0.1 10*3/uL (ref 0.0–0.4)
Eos: 1 %
Hematocrit: 40.3 % (ref 34.0–46.6)
Hemoglobin: 13.9 g/dL (ref 11.1–15.9)
Immature Grans (Abs): 0 10*3/uL (ref 0.0–0.1)
Immature Granulocytes: 0 %
Lymphocytes Absolute: 2.2 10*3/uL (ref 0.7–3.1)
Lymphs: 35 %
MCH: 29.1 pg (ref 26.6–33.0)
MCHC: 34.5 g/dL (ref 31.5–35.7)
MCV: 85 fL (ref 79–97)
Monocytes Absolute: 0.4 10*3/uL (ref 0.1–0.9)
Monocytes: 7 %
Neutrophils Absolute: 3.5 10*3/uL (ref 1.4–7.0)
Neutrophils: 55 %
Platelets: 220 10*3/uL (ref 150–450)
RBC: 4.77 x10E6/uL (ref 3.77–5.28)
RDW: 12.2 % (ref 11.7–15.4)
WBC: 6.4 10*3/uL (ref 3.4–10.8)

## 2021-01-15 LAB — PROTIME-INR
INR: 1 (ref 0.9–1.2)
Prothrombin Time: 10 s (ref 9.1–12.0)

## 2021-01-15 LAB — HEMOGLOBIN A1C
Est. average glucose Bld gHb Est-mCnc: 105 mg/dL
Hgb A1c MFr Bld: 5.3 % (ref 4.8–5.6)

## 2021-01-15 NOTE — Telephone Encounter (Signed)
Will send message to Dr. Tanna Furry scheduler [place] to reach out to the pt with an appt for pre op.

## 2021-01-15 NOTE — Progress Notes (Signed)
DUE TO COVID-19 ONLY ONE VISITOR IS ALLOWED TO COME WITH YOU AND STAY IN THE WAITING ROOM ONLY DURING PRE OP AND PROCEDURE DAY OF SURGERY. THE 1 VISITOR  MAY VISIT WITH YOU AFTER SURGERY IN YOUR PRIVATE ROOM DURING VISITING HOURS ONLY!  YOU NEED TO HAVE A COVID 19 TEST ON___4/11/22____ @_______ , THIS TEST MUST BE DONE BEFORE SURGERY,  COVID TESTING SITE [address] [place] [address], IT IS ON THE RIGHT GOING OUT [address] APPROXIMATELY  2 MINUTES PAST [employer] ON THE RIGHT. ONCE YOUR COVID TEST IS COMPLETED,  PLEASE BEGIN THE QUARANTINE INSTRUCTIONS AS OUTLINED IN YOUR HANDOUT.                Allison Cannon  01/15/2021   Your procedure is scheduled on:  01/30/2021   Report to [facility] Main  Entrance   Report to admitting at     0530 AM     Call this number if you have problems the morning of surgery [phone]    REMEMBER: NO  SOLID FOOD CANDY OR GUM AFTER MIDNIGHT. CLEAR LIQUIDS UNTIL  0430am        . NOTHING BY MOUTH EXCEPT CLEAR LIQUIDS UNTIL       0430am  . PLEASE FINISH ENSURE DRINK PER SURGEON ORDER  WHICH NEEDS TO BE COMPLETED AT 0430am      .      CLEAR LIQUID DIET   Foods Allowed                                                                    Coffee and tea, regular and decaf                            Fruit ices (not with fruit pulp)                                      Iced Popsicles                                    Carbonated beverages, regular and diet                                    Cranberry, grape and apple juices Sports drinks like Gatorade Lightly seasoned clear broth or consume(fat free) Sugar, honey syrup ___________________________________________________________________      BRUSH YOUR TEETH MORNING OF SURGERY AND RINSE YOUR MOUTH OUT, NO CHEWING GUM CANDY OR MINTS.     Take these medicines the morning of surgery with A SIP OF WATER:  None  DO NOT TAKE ANY DIABETIC MEDICATIONS DAY OF YOUR SURGERY                                You may not have any metal on your body including hair pins and  piercings  Do not wear jewelry, make-up, lotions, powders or perfumes, deodorant             Do not wear nail polish on your fingernails.  Do not shave  48 hours prior to surgery.              Men may shave face and neck.   Do not bring valuables to the hospital. [name].  Contacts, dentures or bridgework may not be worn into surgery.  Leave suitcase in the car. After surgery it may be brought to your room.     Patients discharged the day of surgery will not be allowed to drive home. IF YOU ARE HAVING SURGERY AND GOING HOME THE SAME DAY, YOU MUST HAVE AN ADULT TO DRIVE YOU HOME AND BE WITH YOU FOR 24 HOURS. YOU MAY GO HOME BY TAXI OR UBER OR ORTHERWISE, BUT AN ADULT MUST ACCOMPANY YOU HOME AND STAY WITH YOU FOR 24 HOURS.  Name and phone number of your driver:  Special Instructions: N/A              Please read over the following fact sheets you were given: _____________________________________________________________________  [facility] - Preparing for Surgery Before surgery, you can play an important role.  Because skin is not sterile, your skin needs to be as free of germs as possible.  You can reduce the number of germs on your skin by washing with CHG (chlorahexidine gluconate) soap before surgery.  CHG is an antiseptic cleaner which kills germs and bonds with the skin to continue killing germs even after washing. Please DO NOT use if you have an allergy to CHG or antibacterial soaps.  If your skin becomes reddened/irritated stop using the CHG and inform your nurse when you arrive at Short Stay. Do not shave (including legs and underarms) for at least 48 hours prior to the first CHG shower.  You may shave your face/neck. Please follow these instructions carefully:  1.  Shower with CHG Soap the night before surgery and the  morning of  Surgery.  2.  If you choose to wash your hair, wash your hair first as usual with your  normal  shampoo.  3.  After you shampoo, rinse your hair and body thoroughly to remove the  shampoo.                           4.  Use CHG as you would any other liquid soap.  You can apply chg directly  to the skin and wash                       Gently with a scrungie or clean washcloth.  5.  Apply the CHG Soap to your body ONLY FROM THE NECK DOWN.   Do not use on face/ open                           Wound or open sores. Avoid contact with eyes, ears mouth and genitals (private parts).                       Wash face,  Genitals (private parts) with your normal soap.             6.  Wash thoroughly, paying special attention to the area where your surgery  will be performed.  7.  Thoroughly rinse your body with warm water from the neck down.  8.  DO NOT shower/wash with your normal soap after using and rinsing off  the CHG Soap.                9.  Pat yourself dry with a clean towel.            10.  Wear clean pajamas.            11.  Place clean sheets on your bed the night of your first shower and do not  sleep with pets. Day of Surgery : Do not apply any lotions/deodorants the morning of surgery.  Please wear clean clothes to the hospital/surgery center.  FAILURE TO FOLLOW THESE INSTRUCTIONS MAY RESULT IN THE CANCELLATION OF YOUR SURGERY PATIENT SIGNATURE_________________________________  NURSE SIGNATURE__________________________________  ________________________________________________________________________

## 2021-01-15 NOTE — Telephone Encounter (Signed)
Primary Cardiologist:Gregg [name], MD  Chart reviewed as part of pre-operative protocol coverage. Because of Allison Cannon past medical history and time since last visit, he/she will require a follow-up visit in order to better assess preoperative cardiovascular risk.  Pre-op covering staff: - Please schedule appointment and call patient to inform them. - Please contact requesting surgeon's office via preferred method (i.e, phone, fax) to inform them of need for appointment prior to surgery.  If applicable, this message will also be routed to pharmacy pool and/or primary cardiologist for input on holding anticoagulant/antiplatelet agent as requested below so that this information is available at time of patient's appointment.   Deberah Pelton, NP  01/15/2021, 8:53 AM

## 2021-01-15 NOTE — Progress Notes (Signed)
Her labs are fine and it is okay to send the surgical clearance form.

## 2021-01-16 NOTE — Telephone Encounter (Signed)
Pt has been scheduled to see Tommye Standard, [facility] 01/21/21 for pre op clearance. Will forward clearance notes to PA for upcoming appt.

## 2021-01-19 NOTE — Progress Notes (Signed)
Cardiology Office Note Date:  01/19/2021  Patient ID:  Nicci, Vaughan 1956-04-10, MRN [id number] PCP:  [name], NP-C  Cardiologist:  Dr. [name] Electrophysiologist: Dr. [name]   Chief Complaint:  pre-op  History of Present Illness: Delaila Nand is a 65 y.o. female with no known PMHx until June 2021 suffered cardiac arrest Admitted 03/19/21 Ms. Klee was at the [facility] exercising when she was witnessed to collapse.  EMS record reviewed On their arrival FD was on scene, [t was conscious though with snoring type respirations Reportedly she had immediate CPR started and once AED applied was advised shock and she was given one shock  By AED, afterwards she became conscious though with body jerking, throwing her head back, incontinent of urine and spastic type movements.  She had Left gaze preference, displayed lip smacking, sticking tongue out. She was found in SR and maintained SR through their tracings, rates low 100's, and stable BP.  Code stroke was activated, neurology and cardiology consulted.  Neurology note from today is incomplete though appears they feel most likelythepatient suffered from a syncopal episode secondary to an acute arrhythmia,followed by syncopal seizure. EEG is normal, which militates against epilepsy as the primary etiology for her presentation.  MRI brain is negative for stroke. 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  LVEF 70-75%, hyperdynamic C.MRI findings c/w hypertensive heart disease Cath with NOD Underwent ICD implant  1. She is pending hip surgery (LEFT TOTAL HIP ARTHROPLASTY), 01/30/21 needs clearance.  TODAY She is doing well, despite her hip pain. Has been back to the gym, continues to have excellent exertional capacity No CP, palpitations No SOB No dizzy spells, near syncope or syncope. She has been taking Mag replacement had to have the dose lowered  with some GI intolerance, her mag on admit was 1.6, not likely the contributor, but with replacement 1.9, advised to continue She will need to hold for surgery apparently, that will be OK from [place].  RCRI score is zero, 0.4%   Device information SJM single chamber ICD implanted 03/21/2020 Secondary prevention   Past Medical History:  Diagnosis Date  . Arthritis of left hip     Past Surgical History:  Procedure Laterality Date  . CHOLECYSTECTOMY    . ICD IMPLANT N/A 03/21/2020   Procedure: ICD IMPLANT;  Surgeon: Evans Lance, MD;  Location: [place] [facility];  Service: Cardiovascular;  Laterality: N/A;  . KNEE ARTHROSCOPY    . LEFT HEART CATH AND CORONARY ANGIOGRAPHY N/A 03/21/2020   Procedure: LEFT HEART CATH AND CORONARY ANGIOGRAPHY;  Surgeon: Troy Sine, MD;  Location: [place] [facility];  Service: Cardiovascular;  Laterality: N/A;  . SP CHOLECYSTOMY      Current Outpatient Medications  Medication Sig Dispense Refill  . Cholecalciferol (VITAMIN D) 50 MCG (2000 UT) tablet Take 2,000 Units by mouth daily.    . diphenhydrAMINE (BENADRYL) 25 MG tablet Take 25 mg by mouth daily as needed for allergies.    Marland Kitchen ibuprofen (ADVIL) 200 MG tablet Take 200 mg by mouth every 6 (six) hours as needed for headache or mild pain.    . Magnesium 250 MG TABS Take 250 mg by mouth 2 (two) times daily.    . Turmeric 450 MG CAPS Take 450 mg by mouth daily.     No current facility-administered medications for this visit.    Allergies:   Patient has no known allergies.  Social History:  The patient  reports that she has never smoked. She has never used smokeless tobacco. She reports current alcohol use of about 1.0 - 5.0 standard drink of alcohol per week. She reports that she does not use drugs.   Family History:  The patient's family history includes Breast cancer (age of onset: 23) in her mother; Cancer in her brother; Colon cancer (age of onset: 22) in her sister; Stroke in her father and  mother.  ROS:  Please see the history of present illness.    All other systems are reviewed and otherwise negative.   PHYSICAL EXAM:  VS:  There were no vitals taken for this visit. BMI: There is no height or weight on file to calculate BMI. Well nourished, well developed, in no acute distress HEENT: normocephalic, atraumatic Neck: no JVD, carotid bruits or masses Cardiac:  RRR; no significant murmurs, no rubs, or gallops Lungs:  CTA b/l, no wheezing, rhonchi or rales Abd: soft, nontender MS: no deformity or atrophy Ext: no edema Skin: warm and dry, no rash Neuro:  No gross deficits appreciated Psych: euthymic mood, full affect  ICD site is stable, no tethering or discomfort   EKG:  Done today and reviewed by myself shows  SR 78bpm, no ST/T changes  Device interrogation done today and reviewed by myself:  Battery and lead measurements are good No arrhythmias No programming changes made <1% VP       03/21/2020: LHC  Normal coronary arteries with a very dominant right coronary system.  LV EDP 9 mmHg.  RECOMMENDATION: Catheterization results were reviewed with Dr. Crissie Sickles.  With normal coronary arteries, the patient will be transported to the EP lab for placement of ICD.   03/21/2020: c.MRI IMPRESSION: 1. Normal left ventricular size with moderate basal septal hypertrophy and mild hypertrophy of the remaining myocardium and hyperdynamic systolic function (LVEF = 74%). There are no regional wall motion abnormalities and no late gadolinium enhancement in the left ventricular myocardium. Extracellular volume is increased at 33% (normal < 25%).  2. Normal right ventricular size, thickness and systolic function (LVEF = 65%). There are no regional wall motion abnormalities.  3. Mildly dilated left atrium. normal right atrial size.  4. Normal size of the aortic root, ascending aorta and pulmonary artery.  5. Mild mitral regurgitation.  6. Normal pericardium.  Trivial pericardial effusion.  These findings are consistent with hypertensive heart disease. There is no evidence for arrhythmogenic right ventricular cardiomyopathy or infiltrative cardiomyopathy.     03/19/2020: TTE IMPRESSIONS  1. Left ventricular ejection fraction, by estimation, is 70 to 75%. The  left ventricle has hyperdynamic function. The left ventricle has no  regional wall motion abnormalities. Left ventricular diastolic parameters  were normal.  2. Right ventricular systolic function is normal. The right ventricular  size is normal.  3. Left atrial size was mildly dilated.  4. Right atrial size was mildly dilated.  5. The mitral valve is normal in structure. Trivial mitral valve  regurgitation. No evidence of mitral stenosis.  6. The aortic valve is tricuspid. Aortic valve regurgitation is not  visualized. No aortic stenosis is present.  7. Aortic dilatation noted. There is borderline dilatation of the  ascending aorta measuring 37 mm.  8. The inferior vena cava is normal in size with <50% respiratory  variability, suggesting right atrial pressure of 8 mmHg.    Recent Labs: 03/19/2020: TSH 1.195 12/20/2020: Magnesium 1.9 01/14/2021: ALT 16; BUN 11; Creatinine, Ser 0.70; Hemoglobin 13.9;  Platelets 220; Potassium 4.7; Sodium 143  06/28/2020: Chol/HDL Ratio 1.9; Cholesterol, Total 199; HDL 107; LDL Chol Calc (NIH) 80; Triglycerides 64   Estimated Creatinine Clearance: 68.8 mL/min (by C-G formula based on SCr of 0.7 mg/dL).   Wt Readings from Last 3 Encounters:  01/14/21 138 lb (62.6 kg)  07/26/20 135 lb (61.2 kg)  06/28/20 134 lb (60.8 kg)     Other studies reviewed: Additional studies/records reviewed today include: summarized above  ASSESSMENT AND PLAN:  1. Cardiac arrest     Collapsed at the gym, presumed VT/VF with AED advised shock     Event is unexplained  2. ICD     Intact function, no programming changes made  3. Pre-op, hip surgery     Low  risk surgery     No need for further cardiac w/u pre-op     Routine peri-procedure ICD management     low cardiac risk score, unclear etiology of her arrest     No need for new pre-op cardiac w/u     No contraindication for her hip surgery   Disposition: F/u with remotes as usual, in clnic in a year, sooner if needed.  Current medicines are reviewed at length with the patient today.  The patient did not have any concerns regarding medicines.  Venetia Night, PA-C 01/19/2021 4:26 PM     [place] [id number] [phone] (office)  [phone] (fax)

## 2021-01-20 ENCOUNTER — Ambulatory Visit: Payer: Self-pay | Admitting: Student

## 2021-01-20 NOTE — H&P (View-Only) (Signed)
TOTAL HIP ADMISSION H&P  Patient is admitted for left total hip arthroplasty.  Subjective:  Chief Complaint: left hip pain  HPI: Allison Cannon, 65 y.o. female, has a history of pain and functional disability in the left hip(s) due to arthritis and patient has failed non-surgical conservative treatments for greater than 12 weeks to include NSAID's and/or analgesics and activity modification.  Onset of symptoms was gradual starting 3 years ago with gradually worsening course since that time.The patient noted no past surgery on the left hip(s).  Patient currently rates pain in the left hip at 8 out of 10 with activity. Patient has worsening of pain with activity and weight bearing, pain that interfers with activities of daily living and pain with passive range of motion. Patient has evidence of subchondral cysts, subchondral sclerosis and joint space narrowing by imaging studies. This condition presents safety issues increasing the risk of falls.  There is no current active infection.  Patient Active Problem List   Diagnosis Date Noted  . Localized osteoporosis without current pathological fracture 07/28/2020  . Estrogen deficiency 07/28/2020  . ICD (implantable cardioverter-defibrillator) in place 06/27/2020  . Cardiac arrest ([place])   . LFT elevation   . Primary osteoarthritis of left hip 11/07/2019   Past Medical History:  Diagnosis Date  . Arthritis of left hip     Past Surgical History:  Procedure Laterality Date  . CHOLECYSTECTOMY    . ICD IMPLANT N/A 03/21/2020   Procedure: ICD IMPLANT;  Surgeon: Evans Lance, MD;  Location: [facility];  Service: Cardiovascular;  Laterality: N/A;  . KNEE ARTHROSCOPY    . LEFT HEART CATH AND CORONARY ANGIOGRAPHY N/A 03/21/2020   Procedure: LEFT HEART CATH AND CORONARY ANGIOGRAPHY;  Surgeon: Troy Sine, MD;  Location: [facility];  Service: Cardiovascular;  Laterality: N/A;  . SP CHOLECYSTOMY      Current Outpatient Medications   Medication Sig Dispense Refill Last Dose  . Cholecalciferol (VITAMIN D) 50 MCG (2000 UT) tablet Take 2,000 Units by mouth daily.     . diphenhydrAMINE (BENADRYL) 25 MG tablet Take 25 mg by mouth daily as needed for allergies.     Marland Kitchen ibuprofen (ADVIL) 200 MG tablet Take 200 mg by mouth every 6 (six) hours as needed for headache or mild pain.     . Magnesium 250 MG TABS Take 250 mg by mouth 2 (two) times daily.     . Turmeric 450 MG CAPS Take 450 mg by mouth daily.      No current facility-administered medications for this visit.   No Known Allergies  Social History   Tobacco Use  . Smoking status: Never Smoker  . Smokeless tobacco: Never Used  Substance Use Topics  . Alcohol use: Yes    Alcohol/week: 1.0 - 5.0 standard drink    Types: 1 - 5 Glasses of wine per week    Comment: 5-7 drinks per week     Family History  Problem Relation Age of Onset  . Stroke Mother   . Breast cancer Mother 72  . Stroke Father   . Cancer Brother   . Colon cancer Sister 71     Review of Systems  Musculoskeletal: Positive for arthralgias.  All other systems reviewed and are negative.   Objective:  Physical Exam Constitutional:      Appearance: She is normal weight.  HENT:     Head: Normocephalic.  Eyes:     Pupils: Pupils are equal, round, and  reactive to light.  Cardiovascular:     Rate and Rhythm: Normal rate.     Pulses: Normal pulses.  Pulmonary:     Breath sounds: Normal breath sounds.  Abdominal:     Palpations: Abdomen is soft.     Tenderness: There is no abdominal tenderness.  Genitourinary:    Comments: Deferred Musculoskeletal:     Cervical back: Normal range of motion.     Comments: Pain with passive ROM left hip  Skin:    General: Skin is warm and dry.  Neurological:     Mental Status: She is alert and oriented to person, place, and time.  Psychiatric:        Mood and Affect: Mood normal.     Vital signs in last 24 hours: @VSRANGES @  Labs:   Estimated body  mass index is 21.45 kg/m as calculated from the following:   Height as of 07/26/20: 5' 7.25" (1.708 m).   Weight as of 01/14/21: 62.6 kg.   Imaging Review Plain radiographs demonstrate severe degenerative joint disease of the left hip(s). The bone quality appears to be adequate for age and reported activity level.      Assessment/Plan:  End stage arthritis, left hip(s)  The patient history, physical examination, clinical judgement of the provider and imaging studies are consistent with end stage degenerative joint disease of the left hip(s) and total hip arthroplasty is deemed medically necessary. The treatment options including medical management, injection therapy, arthroscopy and arthroplasty were discussed at length. The risks and benefits of total hip arthroplasty were presented and reviewed. The risks due to aseptic loosening, infection, stiffness, dislocation/subluxation,  thromboembolic complications and other imponderables were discussed.  The patient acknowledged the explanation, agreed to proceed with the plan and consent was signed. Patient is being admitted for inpatient treatment for surgery, pain control, PT, OT, prophylactic antibiotics, VTE prophylaxis, progressive ambulation and ADL's and discharge planning.The patient is planning to be discharged home after overnight observation

## 2021-01-20 NOTE — H&P (Signed)
TOTAL HIP ADMISSION H&P  Patient is admitted for left total hip arthroplasty.  Subjective:  Chief Complaint: left hip pain  HPI: Allison Cannon, 65 y.o. female, has a history of pain and functional disability in the left hip(s) due to arthritis and patient has failed non-surgical conservative treatments for greater than 12 weeks to include NSAID's and/or analgesics and activity modification.  Onset of symptoms was gradual starting 3 years ago with gradually worsening course since that time.The patient noted no past surgery on the left hip(s).  Patient currently rates pain in the left hip at 8 out of 10 with activity. Patient has worsening of pain with activity and weight bearing, pain that interfers with activities of daily living and pain with passive range of motion. Patient has evidence of subchondral cysts, subchondral sclerosis and joint space narrowing by imaging studies. This condition presents safety issues increasing the risk of falls.  There is no current active infection.  Patient Active Problem List   Diagnosis Date Noted  . Localized osteoporosis without current pathological fracture 07/28/2020  . Estrogen deficiency 07/28/2020  . ICD (implantable cardioverter-defibrillator) in place 06/27/2020  . Cardiac arrest ([place])   . LFT elevation   . Primary osteoarthritis of left hip 11/07/2019   Past Medical History:  Diagnosis Date  . Arthritis of left hip     Past Surgical History:  Procedure Laterality Date  . CHOLECYSTECTOMY    . ICD IMPLANT N/A 03/21/2020   Procedure: ICD IMPLANT;  Surgeon: Evans Lance, MD;  Location: [facility];  Service: Cardiovascular;  Laterality: N/A;  . KNEE ARTHROSCOPY    . LEFT HEART CATH AND CORONARY ANGIOGRAPHY N/A 03/21/2020   Procedure: LEFT HEART CATH AND CORONARY ANGIOGRAPHY;  Surgeon: Troy Sine, MD;  Location: [facility];  Service: Cardiovascular;  Laterality: N/A;  . SP CHOLECYSTOMY      Current Outpatient Medications   Medication Sig Dispense Refill Last Dose  . Cholecalciferol (VITAMIN D) 50 MCG (2000 UT) tablet Take 2,000 Units by mouth daily.     . diphenhydrAMINE (BENADRYL) 25 MG tablet Take 25 mg by mouth daily as needed for allergies.     Marland Kitchen ibuprofen (ADVIL) 200 MG tablet Take 200 mg by mouth every 6 (six) hours as needed for headache or mild pain.     . Magnesium 250 MG TABS Take 250 mg by mouth 2 (two) times daily.     . Turmeric 450 MG CAPS Take 450 mg by mouth daily.      No current facility-administered medications for this visit.   No Known Allergies  Social History   Tobacco Use  . Smoking status: Never Smoker  . Smokeless tobacco: Never Used  Substance Use Topics  . Alcohol use: Yes    Alcohol/week: 1.0 - 5.0 standard drink    Types: 1 - 5 Glasses of wine per week    Comment: 5-7 drinks per week     Family History  Problem Relation Age of Onset  . Stroke Mother   . Breast cancer Mother 63  . Stroke Father   . Cancer Brother   . Colon cancer Sister 15     Review of Systems  Musculoskeletal: Positive for arthralgias.  All other systems reviewed and are negative.   Objective:  Physical Exam Constitutional:      Appearance: She is normal weight.  HENT:     Head: Normocephalic.  Eyes:     Pupils: Pupils are equal, round, and  reactive to light.  Cardiovascular:     Rate and Rhythm: Normal rate.     Pulses: Normal pulses.  Pulmonary:     Breath sounds: Normal breath sounds.  Abdominal:     Palpations: Abdomen is soft.     Tenderness: There is no abdominal tenderness.  Genitourinary:    Comments: Deferred Musculoskeletal:     Cervical back: Normal range of motion.     Comments: Pain with passive ROM left hip  Skin:    General: Skin is warm and dry.  Neurological:     Mental Status: She is alert and oriented to person, place, and time.  Psychiatric:        Mood and Affect: Mood normal.     Vital signs in last 24 hours: @VSRANGES @  Labs:   Estimated body  mass index is 21.45 kg/m as calculated from the following:   Height as of 07/26/20: 5' 7.25" (1.708 m).   Weight as of 01/14/21: 62.6 kg.   Imaging Review Plain radiographs demonstrate severe degenerative joint disease of the left hip(s). The bone quality appears to be adequate for age and reported activity level.      Assessment/Plan:  End stage arthritis, left hip(s)  The patient history, physical examination, clinical judgement of the provider and imaging studies are consistent with end stage degenerative joint disease of the left hip(s) and total hip arthroplasty is deemed medically necessary. The treatment options including medical management, injection therapy, arthroscopy and arthroplasty were discussed at length. The risks and benefits of total hip arthroplasty were presented and reviewed. The risks due to aseptic loosening, infection, stiffness, dislocation/subluxation,  thromboembolic complications and other imponderables were discussed.  The patient acknowledged the explanation, agreed to proceed with the plan and consent was signed. Patient is being admitted for inpatient treatment for surgery, pain control, PT, OT, prophylactic antibiotics, VTE prophylaxis, progressive ambulation and ADL's and discharge planning.The patient is planning to be discharged home after overnight observation

## 2021-01-21 ENCOUNTER — Encounter: Payer: Self-pay | Admitting: Internal Medicine

## 2021-01-21 ENCOUNTER — Ambulatory Visit (INDEPENDENT_AMBULATORY_CARE_PROVIDER_SITE_OTHER): Payer: Medicare Other | Admitting: Physician Assistant

## 2021-01-21 ENCOUNTER — Encounter (HOSPITAL_COMMUNITY)
Admission: RE | Admit: 2021-01-21 | Discharge: 2021-01-21 | Disposition: A | Discharge status: Home / Self Care | Payer: Medicare Other | Source: Ambulatory Visit | Attending: Orthopedic Surgery | Admitting: Orthopedic Surgery

## 2021-01-21 ENCOUNTER — Encounter: Payer: Self-pay | Admitting: Physician Assistant

## 2021-01-21 ENCOUNTER — Other Ambulatory Visit: Payer: Self-pay

## 2021-01-21 ENCOUNTER — Encounter (HOSPITAL_COMMUNITY): Payer: Self-pay

## 2021-01-21 VITALS — BP 120/84 | HR 78 | Ht 67.0 in | Wt 139.4 lb

## 2021-01-21 DIAGNOSIS — Z01812 Encounter for preprocedural laboratory examination: Secondary | ICD-10-CM | POA: Insufficient documentation

## 2021-01-21 DIAGNOSIS — Z95810 Presence of automatic (implantable) cardiac defibrillator: Secondary | ICD-10-CM | POA: Diagnosis not present

## 2021-01-21 DIAGNOSIS — Z01818 Encounter for other preprocedural examination: Secondary | ICD-10-CM

## 2021-01-21 DIAGNOSIS — I469 Cardiac arrest, cause unspecified: Secondary | ICD-10-CM

## 2021-01-21 HISTORY — DX: Nausea with vomiting, unspecified: R11.2

## 2021-01-21 HISTORY — DX: Acute myocardial infarction, unspecified: I21.9

## 2021-01-21 HISTORY — DX: Presence of automatic (implantable) cardiac defibrillator: Z95.810

## 2021-01-21 HISTORY — DX: Other specified postprocedural states: Z98.890

## 2021-01-21 LAB — CBC
HCT: 42.7 % (ref 36.0–46.0)
Hemoglobin: 14.2 g/dL (ref 12.0–15.0)
MCH: 29.5 pg (ref 26.0–34.0)
MCHC: 33.3 g/dL (ref 30.0–36.0)
MCV: 88.8 fL (ref 80.0–100.0)
Platelets: 212 10*3/uL (ref 150–400)
RBC: 4.81 MIL/uL (ref 3.87–5.11)
RDW: 12.7 % (ref 11.5–15.5)
WBC: 5.2 10*3/uL (ref 4.0–10.5)
nRBC: 0 % (ref 0.0–0.2)

## 2021-01-21 LAB — COMPREHENSIVE METABOLIC PANEL
ALT: 17 U/L (ref 0–44)
AST: 21 U/L (ref 15–41)
Albumin: 4.4 g/dL (ref 3.5–5.0)
Alkaline Phosphatase: 83 U/L (ref 38–126)
Anion gap: 8 (ref 5–15)
BUN: 11 mg/dL (ref 8–23)
CO2: 27 mmol/L (ref 22–32)
Calcium: 10 mg/dL (ref 8.9–10.3)
Chloride: 106 mmol/L (ref 98–111)
Creatinine, Ser: 0.56 mg/dL (ref 0.44–1.00)
GFR, Estimated: >60 mL/min (ref >60)
Glucose, Bld: 97 mg/dL (ref 70–99)
Potassium: 4.1 mmol/L (ref 3.5–5.1)
Sodium: 141 mmol/L (ref 135–145)
Total Bilirubin: 0.6 mg/dL (ref 0.3–1.2)
Total Protein: 6.9 g/dL (ref 6.5–8.1)

## 2021-01-21 LAB — URINALYSIS, ROUTINE W REFLEX MICROSCOPIC
Bacteria, UA: NONE SEEN
Bilirubin Urine: NEGATIVE
Glucose, UA: NEGATIVE mg/dL
Hgb urine dipstick: NEGATIVE
Ketones, ur: NEGATIVE mg/dL
Nitrite: NEGATIVE
Protein, ur: NEGATIVE mg/dL
Specific Gravity, Urine: 1.003 — ABNORMAL LOW (ref 1.005–1.030)
pH: 8 (ref 5.0–8.0)

## 2021-01-21 LAB — SURGICAL PCR SCREEN
MRSA, PCR: NEGATIVE
Staphylococcus aureus: POSITIVE — AB

## 2021-01-21 LAB — PROTIME-INR
INR: 1 (ref 0.8–1.2)
Prothrombin Time: 12.9 seconds (ref 11.4–15.2)

## 2021-01-21 NOTE — Progress Notes (Addendum)
Anesthesia Review:  PCP: Vickie henson 01/10/21 medical clearance on chart with LOV note  Cardiologist : DR Cristopher Peru preop clearance with Georgeanna Harrison on 01/21/21  ICD Device check on 12/23/20  Device orders requested on 01/21/21 Chest x-ray : 03/22/20  EKG :06/27/20  Echo :03/19/20  Stress test: Cardiac Cath :  Activity level: can do a flight of stairs  without difficulty  Sleep Study/ CPAP : no  Fasting Blood Sugar :      / Checks Blood Sugar -- times a day:   Blood Thinner/ Instructions /Last Dose: ASA / Instructions/ Last Dose :  01/15/2021- hgba1c- 5.3  U/A done 01/21/21 routed to Dr Lyla Glassing.

## 2021-01-21 NOTE — Progress Notes (Signed)
[facility]GRAMM[facility]  Patient Information: Name:  Allison Cannon  DOB:  [birth year]  MRN:  [id number]    Patient: Allison Cannon  DOB- Apr 24, 1956  MRn- [id number]  Date of Surgery - 01/30/2021  Surgeon- DR Swinteck  Procedure: Left Anterior Total Hip    Cautery will be used.  Position during surgery:   Please send documentation back to:  [facility] (Fax # [phone])   Device Information:  Clinic EP Physician:  Cristopher Peru, MD   Device Type:  Defibrillator Manufacturer and Phone #:  [employer]ott: [phone] Pacemaker Dependent?:  No. Date of Last Device Check:  12/24/20 Normal Device Function?:  Yes.    Electrophysiologist's Recommendations:   Have magnet available.  Provide continuous ECG monitoring when magnet is used or reprogramming is to be performed.   Procedure should not interfere with device function.  No device programming or magnet placement needed.  Per Device Clinic Standing Orders, Simone Curia, RN  5:07 PM 01/21/2021

## 2021-01-21 NOTE — Patient Instructions (Addendum)

## 2021-01-22 LAB — CUP PACEART INCLINIC DEVICE CHECK
Battery Remaining Longevity: 112 mo
Brady Statistic RV Percent Paced: 0 %
Date Time Interrogation Session: 20220405142200
HighPow Impedance: 72 Ohm
Implantable Lead Implant Date: 20210603
Implantable Lead Location: 753860
Implantable Pulse Generator Implant Date: 20210603
Lead Channel Impedance Value: 425 Ohm
Lead Channel Pacing Threshold Amplitude: 0.75 V
Lead Channel Pacing Threshold Amplitude: 0.75 V
Lead Channel Pacing Threshold Pulse Width: 0.5 ms
Lead Channel Pacing Threshold Pulse Width: 0.5 ms
Lead Channel Sensing Intrinsic Amplitude: 12 mV
Lead Channel Setting Pacing Amplitude: 2.5 V
Lead Channel Setting Pacing Pulse Width: 0.5 ms
Lead Channel Setting Sensing Sensitivity: 0.5 mV
Pulse Gen Serial Number: 111007674

## 2021-01-22 NOTE — Progress Notes (Signed)
Anesthesia Chart Review   Case: [id number] Date/Time: 01/30/21 0715   Procedure: TOTAL HIP ARTHROPLASTY ANTERIOR APPROACH (Left Hip)   Anesthesia type: Spinal   Pre-op diagnosis: Left hip degenerative joint disease   Location: [facility] ROOM 08 / [facility]   Surgeons: Rod Can, MD      DISCUSSION:64 y.o. never smoker with h/o PONV, h/o VF arrest s/p AICD (device orders in progress note 01/21/2021), left hip djd scheduled for above procedure 01/30/2021 with Dr. Rod Can.   Normal coronary arteries on cath 03/2020.   Pt last seen by cardiology 01/21/2021. Per OV note,   " Low risk surgery     No need for further cardiac w/u pre-op     Routine peri-procedure ICD management     low cardiac risk score, unclear etiology of her arrest     No need for new pre-op cardiac w/u     No contraindication for her hip surgery"  Anticipate pt can proceed with planned procedure barring acute status change.   VS: BP 125/71   Pulse 70   Temp 37.1 C (Oral)   Resp 16   SpO2 97%   PROVIDERS: [name], NP-C is PCP   Lauree Chandler, MD is Cardiologist   Cristopher Peru, MD is EP LABS: Labs reviewed: Acceptable for surgery. (all labs ordered are listed, but only abnormal results are displayed)  Labs Reviewed  SURGICAL PCR SCREEN - Abnormal; Notable for the following components:      Result Value   Staphylococcus aureus POSITIVE (*)    All other components within normal limits  URINALYSIS, ROUTINE W REFLEX MICROSCOPIC - Abnormal; Notable for the following components:   Color, Urine STRAW (*)    Specific Gravity, Urine 1.003 (*)    Leukocytes,Ua TRACE (*)    All other components within normal limits  CBC  COMPREHENSIVE METABOLIC PANEL  PROTIME-INR  TYPE AND SCREEN     IMAGES:   EKG: 01/21/2021 Rate 78 bpm  SR  CV: Echo 03/19/2020 1. Left ventricular ejection fraction, by estimation, is 70 to 75%. The  left ventricle has hyperdynamic function. The left ventricle has no   regional wall motion abnormalities. Left ventricular diastolic parameters  were normal.  2. Right ventricular systolic function is normal. The right ventricular  size is normal.  3. Left atrial size was mildly dilated.  4. Right atrial size was mildly dilated.  5. The mitral valve is normal in structure. Trivial mitral valve  regurgitation. No evidence of mitral stenosis.  6. The aortic valve is tricuspid. Aortic valve regurgitation is not  visualized. No aortic stenosis is present.  7. Aortic dilatation noted. There is borderline dilatation of the  ascending aorta measuring 37 mm.  8. The inferior vena cava is normal in size with <50% respiratory  variability, suggesting right atrial pressure of 8 mmHg.  Cardiac Cath 03/21/2020 Normal coronary arteries with a very dominant right coronary system.  LV EDP 9 mmHg.  RECOMMENDATION: Catheterization results were reviewed with Dr. Crissie Sickles.  With normal coronary arteries, the patient will be transported to the EP lab for placement of ICD.   Past Medical History:  Diagnosis Date  . AICD (automatic cardioverter/defibrillator) present   . Arthritis of left hip   . Myocardial infarction ([place])   . PONV (postoperative nausea and vomiting)     Past Surgical History:  Procedure Laterality Date  . CHOLECYSTECTOMY    . ICD IMPLANT N/A 03/21/2020   Procedure: ICD IMPLANT;  Surgeon:  Evans Lance, MD;  Location: [facility];  Service: Cardiovascular;  Laterality: N/A;  . KNEE ARTHROSCOPY     x 2  . LEFT HEART CATH AND CORONARY ANGIOGRAPHY N/A 03/21/2020   Procedure: LEFT HEART CATH AND CORONARY ANGIOGRAPHY;  Surgeon: Troy Sine, MD;  Location: [facility];  Service: Cardiovascular;  Laterality: N/A;  . SP CHOLECYSTOMY      MEDICATIONS: . Cholecalciferol (VITAMIN D) 50 MCG (2000 UT) tablet  . diphenhydrAMINE (BENADRYL) 25 MG tablet  . ibuprofen (ADVIL) 200 MG tablet  . Magnesium 250 MG TABS  . Turmeric 450  MG CAPS   No current facility-administered medications for this encounter.     Konrad Felix, PA-C [facility]Surgical Testing [phone]

## 2021-01-22 NOTE — Anesthesia Preprocedure Evaluation (Addendum)
Anesthesia Evaluation  Patient identified by MRN, date of birth, ID band Patient awake    Reviewed: Allergy & Precautions, NPO status , Patient's Chart, lab work & pertinent test results  History of Anesthesia Complications (+) PONV and history of anesthetic complications  Airway Mallampati: II  TM Distance: >3 FB Neck ROM: Full    Dental no notable dental hx.    Pulmonary neg pulmonary ROS,    Pulmonary exam normal breath sounds clear to auscultation       Cardiovascular + CAD and + Past MI  Normal cardiovascular exam+ Cardiac Defibrillator  Rhythm:Regular Rate:Normal     Neuro/Psych negative neurological ROS  negative psych ROS   GI/Hepatic negative GI ROS, Neg liver ROS,   Endo/Other  negative endocrine ROS  Renal/GU negative Renal ROS     Musculoskeletal  (+) Arthritis ,   Abdominal   Peds  Hematology negative hematology ROS (+)   Anesthesia Other Findings Left hip degenerative joint disease  Reproductive/Obstetrics                            Anesthesia Physical Anesthesia Plan  ASA: II  Anesthesia Plan: Spinal   Post-op Pain Management:    Induction: Intravenous  PONV Risk Score and Plan: 3 and Ondansetron, Dexamethasone, Propofol infusion, Midazolam and Treatment may vary due to age or medical condition  Airway Management Planned: Simple Face Mask  Additional Equipment:   Intra-op Plan:   Post-operative Plan:   Informed Consent: I have reviewed the patients History and Physical, chart, labs and discussed the procedure including the risks, benefits and alternatives for the proposed anesthesia with the patient or authorized representative who has indicated his/her understanding and acceptance.     Dental advisory given  Plan Discussed with: CRNA  Anesthesia Plan Comments: (Reviewed PAT note 01/21/2021, Konrad Felix, PA-C)       Anesthesia Quick  Evaluation

## 2021-01-27 ENCOUNTER — Other Ambulatory Visit (HOSPITAL_COMMUNITY)
Admission: RE | Admit: 2021-01-27 | Discharge: 2021-01-27 | Disposition: A | Discharge status: Home / Self Care | Payer: Medicare Other | Source: Ambulatory Visit | Attending: Orthopedic Surgery | Admitting: Orthopedic Surgery

## 2021-01-27 DIAGNOSIS — Z20822 Contact with and (suspected) exposure to covid-19: Secondary | ICD-10-CM | POA: Diagnosis not present

## 2021-01-27 DIAGNOSIS — Z01812 Encounter for preprocedural laboratory examination: Secondary | ICD-10-CM | POA: Insufficient documentation

## 2021-01-27 LAB — SARS CORONAVIRUS 2 (TAT 6-24 HRS): SARS Coronavirus 2: NEGATIVE

## 2021-01-30 ENCOUNTER — Other Ambulatory Visit: Payer: Self-pay

## 2021-01-30 ENCOUNTER — Ambulatory Visit (HOSPITAL_COMMUNITY): Payer: Medicare Other

## 2021-01-30 ENCOUNTER — Encounter (HOSPITAL_COMMUNITY)
Admission: RE | Disposition: A | Discharge status: Home / Self Care | Payer: Self-pay | Source: Home / Self Care | Attending: Orthopedic Surgery

## 2021-01-30 ENCOUNTER — Encounter (HOSPITAL_COMMUNITY): Payer: Self-pay | Admitting: Orthopedic Surgery

## 2021-01-30 ENCOUNTER — Ambulatory Visit (HOSPITAL_COMMUNITY): Payer: Medicare Other | Admitting: Physician Assistant

## 2021-01-30 ENCOUNTER — Ambulatory Visit (HOSPITAL_COMMUNITY): Payer: Medicare Other | Admitting: Certified Registered Nurse Anesthetist

## 2021-01-30 ENCOUNTER — Inpatient Hospital Stay (HOSPITAL_COMMUNITY)
Admission: RE | Admit: 2021-01-30 | Discharge: 2021-01-31 | DRG: 470 | Disposition: A | Discharge status: Home / Self Care | Payer: Medicare Other | Attending: Orthopedic Surgery | Admitting: Orthopedic Surgery

## 2021-01-30 DIAGNOSIS — M1612 Unilateral primary osteoarthritis, left hip: Principal | ICD-10-CM | POA: Diagnosis present

## 2021-01-30 DIAGNOSIS — I252 Old myocardial infarction: Secondary | ICD-10-CM

## 2021-01-30 DIAGNOSIS — Z20822 Contact with and (suspected) exposure to covid-19: Secondary | ICD-10-CM | POA: Diagnosis present

## 2021-01-30 DIAGNOSIS — S82122A Displaced fracture of lateral condyle of left tibia, initial encounter for closed fracture: Secondary | ICD-10-CM

## 2021-01-30 DIAGNOSIS — I2510 Atherosclerotic heart disease of native coronary artery without angina pectoris: Secondary | ICD-10-CM | POA: Diagnosis not present

## 2021-01-30 DIAGNOSIS — Z95810 Presence of automatic (implantable) cardiac defibrillator: Secondary | ICD-10-CM | POA: Diagnosis not present

## 2021-01-30 DIAGNOSIS — Z79899 Other long term (current) drug therapy: Secondary | ICD-10-CM | POA: Diagnosis not present

## 2021-01-30 DIAGNOSIS — Z09 Encounter for follow-up examination after completed treatment for conditions other than malignant neoplasm: Secondary | ICD-10-CM

## 2021-01-30 HISTORY — PX: TOTAL HIP ARTHROPLASTY: SHX124

## 2021-01-30 LAB — TYPE AND SCREEN
ABO/RH(D): B POS
Antibody Screen: NEGATIVE

## 2021-01-30 LAB — ABO/RH: ABO/RH(D): B POS

## 2021-01-30 SURGERY — ARTHROPLASTY, HIP, TOTAL, ANTERIOR APPROACH
Anesthesia: Spinal | Site: Hip | Laterality: Left

## 2021-01-30 MED ORDER — DEXAMETHASONE SODIUM PHOSPHATE 10 MG/ML IJ SOLN
10.0000 mg | Freq: Once | INTRAMUSCULAR | Status: AC
  Administered 2021-01-31: 10 mg via INTRAVENOUS
  Filled 2021-01-30: qty 1

## 2021-01-30 MED ORDER — SODIUM CHLORIDE 0.9 % IR SOLN
Status: DC | PRN
  Administered 2021-01-30: 1000 mL

## 2021-01-30 MED ORDER — TRANEXAMIC ACID-NACL 1000-0.7 MG/100ML-% IV SOLN
1000.0000 mg | INTRAVENOUS | Status: AC
  Administered 2021-01-30: 1000 mg via INTRAVENOUS
  Filled 2021-01-30: qty 100

## 2021-01-30 MED ORDER — MORPHINE SULFATE (PF) 2 MG/ML IV SOLN
0.5000 mg | INTRAVENOUS | Status: DC | PRN
Start: 2021-01-30 — End: 2021-01-31

## 2021-01-30 MED ORDER — POLYETHYLENE GLYCOL 3350 17 G PO PACK: 17.0000 g | PACK | Freq: Every day | ORAL | Status: DC | PRN

## 2021-01-30 MED ORDER — POVIDONE-IODINE 10 % EX SWAB: 2.0000 "application " | Freq: Once | CUTANEOUS | Status: DC

## 2021-01-30 MED ORDER — DEXAMETHASONE SODIUM PHOSPHATE 10 MG/ML IJ SOLN
INTRAMUSCULAR | Status: DC | PRN
  Administered 2021-01-30: 10 mg via INTRAVENOUS

## 2021-01-30 MED ORDER — ONDANSETRON HCL 4 MG/2ML IJ SOLN
INTRAMUSCULAR | Status: DC | PRN
  Administered 2021-01-30: 4 mg via INTRAVENOUS

## 2021-01-30 MED ORDER — ONDANSETRON HCL 4 MG PO TABS
4.0000 mg | ORAL_TABLET | Freq: Four times a day (QID) | ORAL | Status: DC | PRN
Start: 2021-01-30 — End: 2021-01-31

## 2021-01-30 MED ORDER — LACTATED RINGERS IV SOLN: INTRAVENOUS | Status: DC

## 2021-01-30 MED ORDER — ACETAMINOPHEN 10 MG/ML IV SOLN
1000.0000 mg | Freq: Once | INTRAVENOUS | Status: AC
  Administered 2021-01-30: 1000 mg via INTRAVENOUS
  Filled 2021-01-30: qty 100

## 2021-01-30 MED ORDER — DOCUSATE SODIUM 100 MG PO CAPS
100.0000 mg | ORAL_CAPSULE | Freq: Two times a day (BID) | ORAL | Status: DC
  Administered 2021-01-30 – 2021-01-31 (×2): 100 mg via ORAL
  Filled 2021-01-30 (×2): qty 1

## 2021-01-30 MED ORDER — SODIUM CHLORIDE 0.9 % IV SOLN: INTRAVENOUS | Status: DC

## 2021-01-30 MED ORDER — LIDOCAINE 2% (20 MG/ML) 5 ML SYRINGE
INTRAMUSCULAR | Status: AC
  Filled 2021-01-30: qty 5

## 2021-01-30 MED ORDER — GLYCOPYRROLATE PF 0.2 MG/ML IJ SOSY
PREFILLED_SYRINGE | INTRAMUSCULAR | Status: DC | PRN
  Administered 2021-01-30: .2 mg via INTRAVENOUS

## 2021-01-30 MED ORDER — METHOCARBAMOL 500 MG PO TABS: 500.0000 mg | ORAL_TABLET | Freq: Four times a day (QID) | ORAL | Status: DC | PRN

## 2021-01-30 MED ORDER — ONDANSETRON HCL 4 MG/2ML IJ SOLN: 4.0000 mg | Freq: Once | INTRAMUSCULAR | Status: DC | PRN

## 2021-01-30 MED ORDER — WATER FOR IRRIGATION, STERILE IR SOLN
Status: DC | PRN
  Administered 2021-01-30: 2000 mL

## 2021-01-30 MED ORDER — PHENYLEPHRINE HCL-NACL 10-0.9 MG/250ML-% IV SOLN
INTRAVENOUS | Status: AC
  Filled 2021-01-30: qty 500

## 2021-01-30 MED ORDER — ONDANSETRON HCL 4 MG/2ML IJ SOLN: 4.0000 mg | Freq: Four times a day (QID) | INTRAMUSCULAR | Status: DC | PRN

## 2021-01-30 MED ORDER — CELECOXIB 200 MG PO CAPS
200.0000 mg | ORAL_CAPSULE | Freq: Two times a day (BID) | ORAL | Status: DC
  Administered 2021-01-30 – 2021-01-31 (×2): 200 mg via ORAL
  Filled 2021-01-30 (×2): qty 1

## 2021-01-30 MED ORDER — BUPIVACAINE-EPINEPHRINE 0.25% -1:200000 IJ SOLN
INTRAMUSCULAR | Status: DC | PRN
  Administered 2021-01-30: 30 mL

## 2021-01-30 MED ORDER — DEXAMETHASONE SODIUM PHOSPHATE 10 MG/ML IJ SOLN
INTRAMUSCULAR | Status: AC
  Filled 2021-01-30: qty 1

## 2021-01-30 MED ORDER — BUPIVACAINE-EPINEPHRINE (PF) 0.25% -1:200000 IJ SOLN
INTRAMUSCULAR | Status: AC
  Filled 2021-01-30: qty 30

## 2021-01-30 MED ORDER — POVIDONE-IODINE 10 % EX SWAB
2.0000 "application " | Freq: Once | CUTANEOUS | Status: AC
  Administered 2021-01-30: 2 via TOPICAL

## 2021-01-30 MED ORDER — SODIUM CHLORIDE (PF) 0.9 % IJ SOLN
INTRAMUSCULAR | Status: DC | PRN
  Administered 2021-01-30: 30 mL
  Administered 2021-01-30: 50 mL

## 2021-01-30 MED ORDER — KETOROLAC TROMETHAMINE 30 MG/ML IJ SOLN
INTRAMUSCULAR | Status: DC | PRN
  Administered 2021-01-30: 30 mg

## 2021-01-30 MED ORDER — SENNA 8.6 MG PO TABS
1.0000 | ORAL_TABLET | Freq: Two times a day (BID) | ORAL | Status: DC
  Administered 2021-01-30 – 2021-01-31 (×2): 8.6 mg via ORAL
  Filled 2021-01-30 (×2): qty 1

## 2021-01-30 MED ORDER — PROPOFOL 500 MG/50ML IV EMUL
INTRAVENOUS | Status: DC | PRN
  Administered 2021-01-30: 125 ug/kg/min via INTRAVENOUS

## 2021-01-30 MED ORDER — BUPIVACAINE HCL (PF) 0.75 % IJ SOLN
INTRAMUSCULAR | Status: DC | PRN
  Administered 2021-01-30: 1.8 mL via INTRATHECAL

## 2021-01-30 MED ORDER — CEFAZOLIN SODIUM-DEXTROSE 2-4 GM/100ML-% IV SOLN
2.0000 g | INTRAVENOUS | Status: AC
  Administered 2021-01-30: 2 g via INTRAVENOUS
  Filled 2021-01-30: qty 100

## 2021-01-30 MED ORDER — HYDROCODONE-ACETAMINOPHEN 7.5-325 MG PO TABS: 1.0000 | ORAL_TABLET | ORAL | Status: DC | PRN

## 2021-01-30 MED ORDER — SODIUM CHLORIDE (PF) 0.9 % IJ SOLN
INTRAMUSCULAR | Status: AC
  Filled 2021-01-30: qty 30

## 2021-01-30 MED ORDER — METHOCARBAMOL 500 MG IVPB - SIMPLE MED
500.0000 mg | Freq: Four times a day (QID) | INTRAVENOUS | Status: DC | PRN
  Filled 2021-01-30: qty 50

## 2021-01-30 MED ORDER — FENTANYL CITRATE (PF) 100 MCG/2ML IJ SOLN: 25.0000 ug | INTRAMUSCULAR | Status: DC | PRN

## 2021-01-30 MED ORDER — ISOPROPYL ALCOHOL 70 % SOLN
Status: DC | PRN
  Administered 2021-01-30: 1 via TOPICAL

## 2021-01-30 MED ORDER — ONDANSETRON HCL 4 MG/2ML IJ SOLN
INTRAMUSCULAR | Status: AC
  Filled 2021-01-30: qty 2

## 2021-01-30 MED ORDER — METOCLOPRAMIDE HCL 5 MG/ML IJ SOLN
5.0000 mg | Freq: Three times a day (TID) | INTRAMUSCULAR | Status: DC | PRN
Start: 2021-01-30 — End: 2021-01-31

## 2021-01-30 MED ORDER — EPHEDRINE 5 MG/ML INJ
INTRAVENOUS | Status: AC
  Filled 2021-01-30: qty 10

## 2021-01-30 MED ORDER — KETOROLAC TROMETHAMINE 30 MG/ML IJ SOLN
INTRAMUSCULAR | Status: AC
  Filled 2021-01-30: qty 1

## 2021-01-30 MED ORDER — LIDOCAINE 2% (20 MG/ML) 5 ML SYRINGE
INTRAMUSCULAR | Status: DC | PRN
  Administered 2021-01-30: 40 mg via INTRAVENOUS

## 2021-01-30 MED ORDER — CHLORHEXIDINE GLUCONATE 0.12 % MT SOLN
15.0000 mL | Freq: Once | OROMUCOSAL | Status: AC
  Administered 2021-01-30: 15 mL via OROMUCOSAL

## 2021-01-30 MED ORDER — MIDAZOLAM HCL 2 MG/2ML IJ SOLN
INTRAMUSCULAR | Status: AC
  Filled 2021-01-30: qty 2

## 2021-01-30 MED ORDER — PROPOFOL 1000 MG/100ML IV EMUL
INTRAVENOUS | Status: AC
  Filled 2021-01-30: qty 100

## 2021-01-30 MED ORDER — PHENYLEPHRINE HCL-NACL 10-0.9 MG/250ML-% IV SOLN
INTRAVENOUS | Status: DC | PRN
  Administered 2021-01-30: 40 ug/min via INTRAVENOUS

## 2021-01-30 MED ORDER — MIDAZOLAM HCL 5 MG/5ML IJ SOLN
INTRAMUSCULAR | Status: DC | PRN
  Administered 2021-01-30 (×2): 1 mg via INTRAVENOUS

## 2021-01-30 MED ORDER — 0.9 % SODIUM CHLORIDE (POUR BTL) OPTIME
TOPICAL | Status: DC | PRN
  Administered 2021-01-30: 1000 mL

## 2021-01-30 MED ORDER — PHENOL 1.4 % MT LIQD: 1.0000 | OROMUCOSAL | Status: DC | PRN

## 2021-01-30 MED ORDER — AMISULPRIDE (ANTIEMETIC) 5 MG/2ML IV SOLN: 10.0000 mg | Freq: Once | INTRAVENOUS | Status: DC | PRN

## 2021-01-30 MED ORDER — MENTHOL 3 MG MT LOZG: 1.0000 | LOZENGE | OROMUCOSAL | Status: DC | PRN

## 2021-01-30 MED ORDER — ALUM & MAG HYDROXIDE-SIMETH 200-200-20 MG/5ML PO SUSP: 30.0000 mL | ORAL | Status: DC | PRN

## 2021-01-30 MED ORDER — EPHEDRINE SULFATE-NACL 50-0.9 MG/10ML-% IV SOSY
PREFILLED_SYRINGE | INTRAVENOUS | Status: DC | PRN
  Administered 2021-01-30 (×5): 5 mg via INTRAVENOUS

## 2021-01-30 MED ORDER — DIPHENHYDRAMINE HCL 12.5 MG/5ML PO ELIX: 12.5000 mg | ORAL_SOLUTION | ORAL | Status: DC | PRN

## 2021-01-30 MED ORDER — ASPIRIN 81 MG PO CHEW
81.0000 mg | CHEWABLE_TABLET | Freq: Two times a day (BID) | ORAL | Status: DC
  Administered 2021-01-30 – 2021-01-31 (×2): 81 mg via ORAL
  Filled 2021-01-30 (×2): qty 1

## 2021-01-30 MED ORDER — PROPOFOL 10 MG/ML IV BOLUS
INTRAVENOUS | Status: DC | PRN
  Administered 2021-01-30: 30 mg via INTRAVENOUS

## 2021-01-30 MED ORDER — HYDROCODONE-ACETAMINOPHEN 5-325 MG PO TABS
1.0000 | ORAL_TABLET | ORAL | Status: DC | PRN
  Administered 2021-01-30 (×3): 2 via ORAL
  Administered 2021-01-31 (×2): 1 via ORAL
  Administered 2021-01-31: 2 via ORAL
  Filled 2021-01-30: qty 2
  Filled 2021-01-30: qty 1
  Filled 2021-01-30: qty 2
  Filled 2021-01-30: qty 1
  Filled 2021-01-30 (×2): qty 2

## 2021-01-30 MED ORDER — FENTANYL CITRATE (PF) 100 MCG/2ML IJ SOLN
INTRAMUSCULAR | Status: AC
  Filled 2021-01-30: qty 2

## 2021-01-30 MED ORDER — METOCLOPRAMIDE HCL 5 MG PO TABS: 5.0000 mg | ORAL_TABLET | Freq: Three times a day (TID) | ORAL | Status: DC | PRN

## 2021-01-30 MED ORDER — FENTANYL CITRATE (PF) 100 MCG/2ML IJ SOLN
INTRAMUSCULAR | Status: DC | PRN
  Administered 2021-01-30 (×2): 50 ug via INTRAVENOUS

## 2021-01-30 MED ORDER — ACETAMINOPHEN 325 MG PO TABS
325.0000 mg | ORAL_TABLET | Freq: Four times a day (QID) | ORAL | Status: DC | PRN
Start: 2021-01-31 — End: 2021-01-31

## 2021-01-30 MED ORDER — CEFAZOLIN SODIUM-DEXTROSE 2-4 GM/100ML-% IV SOLN
2.0000 g | Freq: Four times a day (QID) | INTRAVENOUS | Status: AC
  Administered 2021-01-30 (×2): 2 g via INTRAVENOUS
  Filled 2021-01-30 (×2): qty 100

## 2021-01-30 SURGICAL SUPPLY — 62 items
ACETAB CUP W/GRIPTION 54 (Plate) ×2 IMPLANT
BAG DECANTER FOR FLEXI CONT (MISCELLANEOUS) IMPLANT
BAG ZIPLOCK 12X15 (MISCELLANEOUS) IMPLANT
BLADE SURG SZ10 CARB STEEL (BLADE) IMPLANT
CHLORAPREP W/TINT 26 (MISCELLANEOUS) ×2 IMPLANT
COVER PERINEAL POST (MISCELLANEOUS) ×2 IMPLANT
COVER SURGICAL LIGHT HANDLE (MISCELLANEOUS) ×2 IMPLANT
COVER WAND RF STERILE (DRAPES) IMPLANT
CUP ACETAB W/GRIPTION 54 (Plate) ×1 IMPLANT
DECANTER SPIKE VIAL GLASS SM (MISCELLANEOUS) ×2 IMPLANT
DERMABOND ADVANCED (GAUZE/BANDAGES/DRESSINGS) ×1
DERMABOND ADVANCED .7 DNX12 (GAUZE/BANDAGES/DRESSINGS) ×1 IMPLANT
DRAPE IMP U-DRAPE 54X76 (DRAPES) ×2 IMPLANT
DRAPE SHEET LG 3/4 BI-LAMINATE (DRAPES) ×6 IMPLANT
DRAPE STERI IOBAN 125X83 (DRAPES) IMPLANT
DRAPE U-SHAPE 47X51 STRL (DRAPES) ×4 IMPLANT
DRESSING AQUACEL AG SP 3.5X10 (GAUZE/BANDAGES/DRESSINGS) ×1 IMPLANT
DRSG AQUACEL AG ADV 3.5X10 (GAUZE/BANDAGES/DRESSINGS) ×2 IMPLANT
DRSG AQUACEL AG SP 3.5X10 (GAUZE/BANDAGES/DRESSINGS) ×2
ELECT REM PT RETURN 15FT ADLT (MISCELLANEOUS) ×2 IMPLANT
GAUZE SPONGE 4X4 12PLY STRL (GAUZE/BANDAGES/DRESSINGS) ×2 IMPLANT
GLOVE SRG 8 PF TXTR STRL LF DI (GLOVE) ×1 IMPLANT
GLOVE SURG ENC MOIS LTX SZ8.5 (GLOVE) ×4 IMPLANT
GLOVE SURG ENC TEXT LTX SZ7.5 (GLOVE) ×4 IMPLANT
GLOVE SURG UNDER POLY LF SZ8 (GLOVE) ×1
GLOVE SURG UNDER POLY LF SZ8.5 (GLOVE) ×2 IMPLANT
GOWN SPEC L3 XXLG W/TWL (GOWN DISPOSABLE) ×2 IMPLANT
GOWN STRL REUS W/ TWL LRG LVL3 (GOWN DISPOSABLE) ×1 IMPLANT
GOWN STRL REUS W/TWL LRG LVL3 (GOWN DISPOSABLE) ×1
HANDPIECE INTERPULSE COAX TIP (DISPOSABLE) ×1
HEAD CERAMIC 36 PLUS5 (Hips) ×2 IMPLANT
HOLDER FOLEY CATH W/STRAP (MISCELLANEOUS) ×2 IMPLANT
HOOD PEEL AWAY FLYTE STAYCOOL (MISCELLANEOUS) ×8 IMPLANT
JET LAVAGE IRRISEPT WOUND (IRRIGATION / IRRIGATOR)
KIT TURNOVER KIT A (KITS) ×2 IMPLANT
LAVAGE JET IRRISEPT WOUND (IRRIGATION / IRRIGATOR) IMPLANT
LINER NEUTRAL 54X36MM PLUS 4 (Hips) ×2 IMPLANT
MANIFOLD NEPTUNE II (INSTRUMENTS) ×2 IMPLANT
MARKER SKIN DUAL TIP RULER LAB (MISCELLANEOUS) ×2 IMPLANT
NDL SAFETY ECLIPSE 18X1.5 (NEEDLE) ×1 IMPLANT
NEEDLE HYPO 18GX1.5 SHARP (NEEDLE) ×1
NEEDLE SPNL 18GX3.5 QUINCKE PK (NEEDLE) ×2 IMPLANT
PACK ANTERIOR HIP CUSTOM (KITS) ×2 IMPLANT
PENCIL SMOKE EVACUATOR (MISCELLANEOUS) IMPLANT
SAW OSC TIP CART 19.5X105X1.3 (SAW) ×2 IMPLANT
SEALER BIPOLAR AQUA 6.0 (INSTRUMENTS) ×2 IMPLANT
SET HNDPC FAN SPRY TIP SCT (DISPOSABLE) ×1 IMPLANT
STEM TRI LOC BPS SZ7 W GRIPTON (Hips) ×1 IMPLANT
SUT ETHIBOND NAB CT1 #1 30IN (SUTURE) ×4 IMPLANT
SUT MNCRL AB 3-0 PS2 18 (SUTURE) ×2 IMPLANT
SUT MNCRL AB 4-0 PS2 18 (SUTURE) ×2 IMPLANT
SUT MON AB 2-0 CT1 36 (SUTURE) ×4 IMPLANT
SUT STRATAFIX PDO 1 14 VIOLET (SUTURE) ×1
SUT STRATFX PDO 1 14 VIOLET (SUTURE) ×1
SUT VIC AB 2-0 CT1 27 (SUTURE) ×1
SUT VIC AB 2-0 CT1 TAPERPNT 27 (SUTURE) ×1 IMPLANT
SUTURE STRATFX PDO 1 14 VIOLET (SUTURE) ×1 IMPLANT
SYR 3ML LL SCALE MARK (SYRINGE) ×2 IMPLANT
TRAY FOLEY MTR SLVR 16FR STAT (SET/KITS/TRAYS/PACK) IMPLANT
TRI LOC BPS SZ 7 W GRIPTON (Hips) ×2 IMPLANT
TUBE SUCTION HIGH CAP CLEAR NV (SUCTIONS) ×2 IMPLANT
WATER STERILE IRR 1000ML POUR (IV SOLUTION) ×2 IMPLANT

## 2021-01-30 NOTE — Progress Notes (Signed)
Orthopedic Tech Progress Note Patient Details:  Allison Cannon 1956/05/12 [id number] Overhead frame with trapeze has been applied to patient's bed.  Patient ID: Allison Cannon, female   DOB: 1956/07/24, 65 y.o.   MRN: [id number]   Jearld Lesch 01/30/2021, 11:44 AM

## 2021-01-30 NOTE — Discharge Instructions (Signed)
°Dr. Jasun Gasparini °Joint Replacement Specialist °[facility] °[address] °[place], [place] [address] °[phone] ° ° °TOTAL HIP REPLACEMENT POSTOPERATIVE DIRECTIONS ° ° ° °Hip Rehabilitation, Guidelines Following Surgery  ° °WEIGHT BEARING °Weight bearing as tolerated with assist device (walker, cane, etc) as directed, use it as long as suggested by your surgeon or therapist, typically at least 4-6 weeks. ° °The results of a hip operation are greatly improved after range of motion and muscle strengthening exercises. Follow all safety measures which are given to protect your hip. If any of these exercises cause increased pain or swelling in your joint, decrease the amount until you are comfortable again. Then slowly increase the exercises. Call your caregiver if you have problems or questions.  ° °HOME CARE INSTRUCTIONS  °Most of the following instructions are designed to prevent the dislocation of your new hip.  °Remove items at home which could result in a fall. This includes throw rugs or furniture in walking pathways.  °Continue medications as instructed at time of discharge. °· You may have some home medications which will be placed on hold until you complete the course of blood thinner medication. °· You may start showering once you are discharged home. Do not remove your dressing. °Do not put on socks or shoes without following the instructions of your caregivers.   °Sit on chairs with arms. Use the chair arms to help push yourself up when arising.  °Arrange for the use of a toilet seat elevator so you are not sitting low.  °· Walk with walker as instructed.  °You may resume a sexual relationship in one month or when given the OK by your caregiver.  °Use walker as long as suggested by your caregivers.  °You may put full weight on your legs and walk as much as is comfortable. °Avoid periods of inactivity such as sitting longer than an hour when not asleep. This helps prevent  blood clots.  °You may return to work once you are cleared by your surgeon.  °Do not drive a car for 6 weeks or until released by your surgeon.  °Do not drive while taking narcotics.  °Wear elastic stockings for two weeks following surgery during the day but you may remove then at night.  °Make sure you keep all of your appointments after your operation with all of your doctors and caregivers. You should call the office at the above phone number and make an appointment for approximately two weeks after the date of your surgery. °Please pick up a stool softener and laxative for home use as long as you are requiring pain medications. °· ICE to the affected hip every three hours for 30 minutes at a time and then as needed for pain and swelling. Continue to use ice on the hip for pain and swelling from surgery. You may notice swelling that will progress down to the foot and ankle.  This is normal after surgery.  Elevate the leg when you are not up walking on it.   °It is important for you to complete the blood thinner medication as prescribed by your doctor. °· Continue to use the breathing machine which will help keep your temperature down.  It is common for your temperature to cycle up and down following surgery, especially at night when you are not up moving around and exerting yourself.  The breathing machine keeps your lungs expanded and your temperature down. ° °RANGE OF MOTION AND STRENGTHENING EXERCISES  °These exercises are   designed to help you keep full movement of your hip joint. Follow your caregiver's or physical therapist's instructions. Perform all exercises about fifteen times, three times per day or as directed. Exercise both hips, even if you have had only one joint replacement. These exercises can be done on a training (exercise) mat, on the floor, on a table or on a bed. Use whatever works the best and is most comfortable for you. Use music or television while you are exercising so that the exercises  are a pleasant break in your day. This will make your life better with the exercises acting as a break in routine you can look forward to.  °Lying on your back, slowly slide your foot toward your buttocks, raising your knee up off the floor. Then slowly slide your foot back down until your leg is straight again.  °Lying on your back spread your legs as far apart as you can without causing discomfort.  °Lying on your side, raise your upper leg and foot straight up from the floor as far as is comfortable. Slowly lower the leg and repeat.  °Lying on your back, tighten up the muscle in the front of your thigh (quadriceps muscles). You can do this by keeping your leg straight and trying to raise your heel off the floor. This helps strengthen the largest muscle supporting your knee.  °Lying on your back, tighten up the muscles of your buttocks both with the legs straight and with the knee bent at a comfortable angle while keeping your heel on the floor.  ° °SKILLED REHAB INSTRUCTIONS: °If the patient is transferred to a skilled rehab facility following release from the hospital, a list of the current medications will be sent to the facility for the patient to continue.  When discharged from the skilled rehab facility, please have the facility set up the patient's Home Health Physical Therapy prior to being released. Also, the skilled facility will be responsible for providing the patient with their medications at time of release from the facility to include their pain medication and their blood thinner medication. If the patient is still at the rehab facility at time of the two week follow up appointment, the skilled rehab facility will also need to assist the patient in arranging follow up appointment in our office and any transportation needs. ° °MAKE SURE YOU:  °Understand these instructions.  °Will watch your condition.  °Will get help right away if you are not doing well or get worse. ° °Pick up stool softner and  laxative for home use following surgery while on pain medications. °Do not remove your dressing. °The dressing is waterproof--it is OK to take showers. °Continue to use ice for pain and swelling after surgery. °Do not use any lotions or creams on the incision until instructed by your surgeon. °Total Hip Protocol. ° ° °

## 2021-01-30 NOTE — Transfer of Care (Signed)
Immediate Anesthesia Transfer of Care Note  Patient: Nastassia Bazaldua  Procedure(s) Performed: TOTAL HIP ARTHROPLASTY ANTERIOR APPROACH (Left Hip)  Patient Location: PACU  Anesthesia Type:Spinal  Level of Consciousness: awake and patient cooperative  Airway & Oxygen Therapy: Patient Spontanous Breathing and Patient connected to face mask  Post-op Assessment: Report given to RN and Post -op Vital signs reviewed and stable  Post vital signs: Reviewed and stable  Last Vitals:  Vitals Value Taken Time  BP 97/60 01/30/21 1016  Temp    Pulse 96 01/30/21 1018  Resp 10 01/30/21 1018  SpO2 100 % 01/30/21 1018  Vitals shown include unvalidated device data.  Last Pain:  Vitals:   01/30/21 0609  TempSrc:   PainSc: 0-No pain         Complications: No complications documented.

## 2021-01-30 NOTE — Interval H&P Note (Signed)
History and Physical Interval Note:  01/30/2021 7:34 AM  Allison Cannon  has presented today for surgery, with the diagnosis of Left hip degenerative joint disease.  The various methods of treatment have been discussed with the patient and family. After consideration of risks, benefits and other options for treatment, the patient has consented to  Procedure(s): TOTAL HIP ARTHROPLASTY ANTERIOR APPROACH (Left) as a surgical intervention.  The patient's history has been reviewed, patient examined, no change in status, stable for surgery.  I have reviewed the patient's chart and labs.  Questions were answered to the patient's satisfaction.     Hilton Cork Malaiyah Achorn

## 2021-01-30 NOTE — Anesthesia Procedure Notes (Signed)
Spinal  Patient location during procedure: OR Start time: 01/30/2021 7:40 AM End time: 01/30/2021 7:45 AM Reason for block: surgical anesthesia Staffing Performed: anesthesiologist  Anesthesiologist: Murvin Natal, MD Preanesthetic Checklist Completed: patient identified, IV checked, risks and benefits discussed, surgical consent, monitors and equipment checked, pre-op evaluation and timeout performed Spinal Block Patient position: sitting Prep: DuraPrep Patient monitoring: cardiac monitor, continuous pulse ox and blood pressure Approach: midline Location: L4-5 Injection technique: single-shot Needle Needle type: Pencan  Needle gauge: 24 G Needle length: 9 cm Assessment Sensory level: T10 Events: CSF return Additional Notes Functioning IV was confirmed and monitors were applied. Sterile prep and drape, including hand hygiene and sterile gloves were used. The patient was positioned and the spine was prepped. The skin was anesthetized with lidocaine.  Free flow of clear CSF was obtained prior to injecting local anesthetic into the CSF.  The spinal needle aspirated freely following injection.  The needle was carefully withdrawn.  The patient tolerated the procedure well.

## 2021-01-30 NOTE — Op Note (Signed)
OPERATIVE REPORT  SURGEON: Rod Can, MD   ASSISTANT: Cherlynn June, PA-C.  PREOPERATIVE DIAGNOSIS: Left hip arthritis.   POSTOPERATIVE DIAGNOSIS: Left hip arthritis.   PROCEDURE: Left total hip arthroplasty, anterior approach.   IMPLANTS: DePuy Tri Lock stem, size 7, hi offset. DePuy Pinnacle Cup, size 54 mm. DePuy Altrx liner, size 36 by 54 mm, +4 neutral. DePuy Biolox ceramic head ball, size 36 + 5 mm.  ANESTHESIA:  MAC and Spinal  ESTIMATED BLOOD LOSS: 250 mL.   ANTIBIOTICS: 3g Ancef.  DRAINS: None.  COMPLICATIONS: None.   CONDITION: PACU - hemodynamically stable.   BRIEF CLINICAL NOTE: Allison Cannon is a 65 y.o. female with a long-standing history of Left hip arthritis. After failing conservative management, the patient was indicated for total hip arthroplasty. The risks, benefits, and alternatives to the procedure were explained, and the patient elected to proceed.  PROCEDURE IN DETAIL: Surgical site was marked by myself in the pre-op holding area. Once inside the operating room, spinal anesthesia was obtained, and a foley catheter was inserted. The patient was then positioned on the Hana table.  All bony prominences were well padded.  The hip was prepped and draped in the normal sterile surgical fashion.  A time-out was called verifying side and site of surgery. The patient received IV antibiotics within 60 minutes of beginning the procedure.   The direct anterior approach to the hip was performed through the Hueter interval.  Lateral femoral circumflex vessels were treated with the Auqumantys. The anterior capsule was exposed and an inverted T capsulotomy was made. The femoral neck cut was made to the level of the templated cut.  A corkscrew was placed into the head and the head was removed.  The femoral head was found to have eburnated bone. The head was passed to the back table and was measured.   Acetabular exposure was achieved, and the pulvinar and labrum  were excised. Sequential reaming of the acetabulum was then performed up to a size 53 mm reamer. A 54 mm cup was then opened and impacted into place at approximately 40 degrees of abduction and 20 degrees of anteversion. The final polyethylene liner was impacted into place and acetabular osteophytes were removed.    I then gained femoral exposure taking care to protect the abductors and greater trochanter.  This was performed using standard external rotation, extension, and adduction.  The capsule was peeled off the inner aspect of the greater trochanter, taking care to preserve the short external rotators. A cookie cutter was used to enter the femoral canal, and then the femoral canal finder was placed.  Sequential broaching was performed up to a size 7.  Calcar planer was used on the femoral neck remnant.  I placed a hi offset neck and a trial head ball.  The hip was reduced.  Leg lengths and offset were checked fluoroscopically.  The hip was dislocated and trial components were removed.  The final implants were placed, and the hip was reduced.  Fluoroscopy was used to confirm component position and leg lengths.  At 90 degrees of external rotation and full extension, the hip was stable to an anterior directed force. In order to achieve adequate soft tissue tension and stability, lengthening of a few millimeters and addition of offset was required.    The wound was copiously irrigated with Irrisept solution and normal saline using pule lavage.  Marcaine solution was injected into the periarticular soft tissue.  The wound was closed in layers using #1  Stratafix for the fascia, 2-0 Vicryl for the subcutaneous fat, 2-0 Monocryl for the deep dermal layer, 3-0 running Monocryl subcuticular stitch, and Dermabond for the skin.  Once the glue was fully dried, an Aquacell Ag dressing was applied.  The patient was transported to the recovery room in stable condition.  Sponge, needle, and instrument counts were correct at  the end of the case x2.  The patient tolerated the procedure well and there were no known complications.  Please note that a surgical assistant was a medical necessity for this procedure to perform it in a safe and expeditious manner. Assistant was necessary to provide appropriate retraction of vital neurovascular structures, to prevent femoral fracture, and to allow for anatomic placement of the prosthesis.

## 2021-01-30 NOTE — Anesthesia Postprocedure Evaluation (Signed)
Anesthesia Post Note  Patient: Allison Cannon  Procedure(s) Performed: TOTAL HIP ARTHROPLASTY ANTERIOR APPROACH (Left Hip)     Patient location during evaluation: PACU Anesthesia Type: Spinal Level of consciousness: awake Pain management: pain level controlled Vital Signs Assessment: post-procedure vital signs reviewed and stable Respiratory status: spontaneous breathing, respiratory function stable and patient connected to nasal cannula oxygen Cardiovascular status: blood pressure returned to baseline and stable Postop Assessment: no headache, no backache and no apparent nausea or vomiting Anesthetic complications: no   No complications documented.  Last Vitals:  Vitals:   01/30/21 1125 01/30/21 1328  BP: 100/66 98/61  Pulse: 74 84  Resp:  16  Temp: 36.5 C 36.6 C  SpO2: 100% 99%    Last Pain:  Vitals:   01/30/21 1328  TempSrc: Oral  PainSc:                  Lourdez Mcgahan P Damian Buckles

## 2021-01-30 NOTE — Evaluation (Signed)
Physical Therapy Evaluation Patient Details Name: Allison Cannon MRN: [id number] DOB: [birth year] Today's Date: 01/30/2021   History of Present Illness  Patient is 65 y.o. female s/p Lt THA anterior approach on 01/30/21 with PMH significant for MI, AICD placement.  Clinical Impression  Allison Cannon is a 65 y.o. female POD 0 s/p Lt THA. Patient reports independence with mobility at baseline. Patient is now limited by functional impairments (see PT problem list below) and requires min guard/assist for transfers and gait with RW. Patient was limited to ambulate ~8 feet with RW and min assist secondary to lightheadedness. Symptoms resolved with seated rest. Patient instructed in exercise to facilitate circulation to reduce risk of DVT. Patient will benefit from continued skilled PT interventions to address impairments and progress towards PLOF. Acute PT will follow to progress mobility and stair training in preparation for safe discharge home.     Follow Up Recommendations Follow surgeon's recommendation for DC plan and follow-up therapies    Equipment Recommendations  Rolling walker with 5" wheels    Recommendations for Other Services       Precautions / Restrictions Precautions Precautions: Fall Restrictions Weight Bearing Restrictions: No      Mobility  Bed Mobility Overal bed mobility: Needs Assistance Bed Mobility: Supine to Sit     Supine to sit: Min guard;HOB elevated     General bed mobility comments: pt able to mobilize LE's to EOB without assist    Transfers Overall transfer level: Needs assistance Equipment used: Rolling walker (2 wheeled) Transfers: Sit to/from Stand Sit to Stand: Min assist;Min guard         General transfer comment: cues for safe hand placement, light assist to steady with rise but pt initiated power up independently  Ambulation/Gait Ambulation/Gait assistance: Min assist Gait Distance (Feet): 8 Feet Assistive device: Rolling walker (2  wheeled) Gait Pattern/deviations: Step-to pattern;Decreased stride length Gait velocity: decr   General Gait Details: cues for safe step pattern and proximity to RW, no overt LOB noted. pt c/o lightheaded/dizzy sensation and seated rest provided. symptoms resolved in sitting.  Stairs            Wheelchair Mobility    Modified Rankin (Stroke Patients Only)       Balance Overall balance assessment: Needs assistance Sitting-balance support: Feet supported Sitting balance-Leahy Scale: Good     Standing balance support: During functional activity;Bilateral upper extremity supported Standing balance-Leahy Scale: Fair                               Pertinent Vitals/Pain Pain Assessment: No/denies pain    Home Living Family/patient expects to be discharged to:: Private residence Living Arrangements: Spouse/significant other Available Help at Discharge: Family Type of Home: House Home Access: Stairs to enter Entrance Stairs-Rails: None Technical brewer of Steps: 1 Home Layout: One level Home Equipment: Civil engineer, contracting - built in;Hand held shower head Additional Comments: her sister can also help    Prior Function Level of Independence: Independent               Hand Dominance   Dominant Hand: Right    Extremity/Trunk Assessment   Upper Extremity Assessment Upper Extremity Assessment: Overall WFL for tasks assessed    Lower Extremity Assessment Lower Extremity Assessment: Overall WFL for tasks assessed    Cervical / Trunk Assessment Cervical / Trunk Assessment: Normal  Communication   Communication: No difficulties  Cognition Arousal/Alertness: Awake/alert Behavior During Therapy:  WFL for tasks assessed/performed Overall Cognitive Status: Within Functional Limits for tasks assessed                                        General Comments      Exercises Total Joint Exercises Ankle Circles/Pumps: AROM;Both;20  reps;Seated   Assessment/Plan    PT Assessment Patient needs continued PT services  PT Problem List Decreased strength;Decreased range of motion;Decreased activity tolerance;Decreased balance;Decreased mobility;Decreased knowledge of use of DME;Decreased knowledge of precautions;Pain       PT Treatment Interventions DME instruction;Gait training;Stair training;Functional mobility training;Therapeutic activities;Balance training;Therapeutic exercise;Patient/family education    PT Goals (Current goals can be found in the Care Plan section)  Acute Rehab PT Goals Patient Stated Goal: get back to her exercise classes at the [facility] [employer] PT Goal Formulation: With patient Time For Goal Achievement: 02/06/21 Potential to Achieve Goals: Good    Frequency 7X/week   Barriers to discharge        Co-evaluation               AM-PAC PT "6 Clicks" Mobility  Outcome Measure Help needed turning from your back to your side while in a flat bed without using bedrails?: None Help needed moving from lying on your back to sitting on the side of a flat bed without using bedrails?: A Little Help needed moving to and from a bed to a chair (including a wheelchair)?: A Little Help needed standing up from a chair using your arms (e.g., wheelchair or bedside chair)?: A Little Help needed to walk in hospital room?: A Little Help needed climbing 3-5 steps with a railing? : A Little 6 Click Score: 19    End of Session Equipment Utilized During Treatment: Gait belt Activity Tolerance: Patient tolerated treatment well Patient left: with call bell/phone within reach;in chair;with chair alarm set Nurse Communication: Mobility status PT Visit Diagnosis: Muscle weakness (generalized) (M62.81);Difficulty in walking, not elsewhere classified (R26.2)    Time: 3570-1779 PT Time Calculation (min) (ACUTE ONLY): 30 min   Charges:   PT Evaluation $PT Eval Low Complexity: 1 Low PT Treatments $Gait Training: 8-22  mins        Verner Mould, DPT Acute Rehabilitation Services Office [phone] Pager [phone]    Jacques Navy 01/30/2021, 4:41 PM

## 2021-01-31 DIAGNOSIS — I252 Old myocardial infarction: Secondary | ICD-10-CM | POA: Diagnosis not present

## 2021-01-31 DIAGNOSIS — Z95810 Presence of automatic (implantable) cardiac defibrillator: Secondary | ICD-10-CM | POA: Diagnosis not present

## 2021-01-31 DIAGNOSIS — I2510 Atherosclerotic heart disease of native coronary artery without angina pectoris: Secondary | ICD-10-CM | POA: Diagnosis not present

## 2021-01-31 DIAGNOSIS — Z79899 Other long term (current) drug therapy: Secondary | ICD-10-CM | POA: Diagnosis not present

## 2021-01-31 DIAGNOSIS — Z20822 Contact with and (suspected) exposure to covid-19: Secondary | ICD-10-CM | POA: Diagnosis not present

## 2021-01-31 DIAGNOSIS — M1612 Unilateral primary osteoarthritis, left hip: Secondary | ICD-10-CM | POA: Diagnosis not present

## 2021-01-31 LAB — BASIC METABOLIC PANEL
Anion gap: 5 (ref 5–15)
BUN: 11 mg/dL (ref 8–23)
CO2: 27 mmol/L (ref 22–32)
Calcium: 8.9 mg/dL (ref 8.9–10.3)
Chloride: 106 mmol/L (ref 98–111)
Creatinine, Ser: 0.6 mg/dL (ref 0.44–1.00)
GFR, Estimated: >60 mL/min (ref >60)
Glucose, Bld: 118 mg/dL — ABNORMAL HIGH (ref 70–99)
Potassium: 3.7 mmol/L (ref 3.5–5.1)
Sodium: 138 mmol/L (ref 135–145)

## 2021-01-31 LAB — CBC
HCT: 31.3 % — ABNORMAL LOW (ref 36.0–46.0)
Hemoglobin: 10.2 g/dL — ABNORMAL LOW (ref 12.0–15.0)
MCH: 29 pg (ref 26.0–34.0)
MCHC: 32.6 g/dL (ref 30.0–36.0)
MCV: 88.9 fL (ref 80.0–100.0)
Platelets: 168 10*3/uL (ref 150–400)
RBC: 3.52 MIL/uL — ABNORMAL LOW (ref 3.87–5.11)
RDW: 12.9 % (ref 11.5–15.5)
WBC: 8.6 10*3/uL (ref 4.0–10.5)
nRBC: 0 % (ref 0.0–0.2)

## 2021-01-31 MED ORDER — SENNA 8.6 MG PO TABS: 1.0000 | ORAL_TABLET | Freq: Two times a day (BID) | ORAL | 0 refills | Status: DC

## 2021-01-31 MED ORDER — ONDANSETRON HCL 4 MG PO TABS: 4.0000 mg | ORAL_TABLET | Freq: Four times a day (QID) | ORAL | 0 refills | Status: DC | PRN

## 2021-01-31 MED ORDER — SODIUM CHLORIDE 0.9 % IV BOLUS
500.0000 mL | Freq: Once | INTRAVENOUS | Status: AC
  Administered 2021-01-31: 500 mL via INTRAVENOUS

## 2021-01-31 MED ORDER — DOCUSATE SODIUM 100 MG PO CAPS
100.0000 mg | ORAL_CAPSULE | Freq: Two times a day (BID) | ORAL | 0 refills | Status: DC
Start: 2021-01-31 — End: 2022-04-14

## 2021-01-31 MED ORDER — ACETAMINOPHEN 325 MG PO TABS: 325.0000 mg | ORAL_TABLET | Freq: Four times a day (QID) | ORAL | 0 refills | Status: AC | PRN

## 2021-01-31 MED ORDER — ASPIRIN 81 MG PO CHEW: 81.0000 mg | CHEWABLE_TABLET | Freq: Two times a day (BID) | ORAL | 0 refills | Status: AC

## 2021-01-31 MED ORDER — HYDROCODONE-ACETAMINOPHEN 5-325 MG PO TABS: 1.0000 | ORAL_TABLET | ORAL | 0 refills | Status: AC | PRN

## 2021-01-31 NOTE — Progress Notes (Signed)
    Subjective:  Patient reports pain as mild.  Denies N/V/CP/SOB. Patient has been getting lightheaded upon standing  Objective:   VITALS:   Vitals:   01/30/21 2141 01/31/21 0219 01/31/21 0510 01/31/21 1055  BP: 99/67 (!) 95/58 97/65 104/61  Pulse: 77 77 79 70  Resp: 16 16 16 17   Temp: 97.9 F (36.6 C) 97.8 F (36.6 C) (!) 97.5 F (36.4 C) 97.8 F (36.6 C)  TempSrc:    Oral  SpO2: 97% 98% 100%   Weight:      Height:        NAD ABD soft Neurovascular intact Sensation intact distally Intact pulses distally Dorsiflexion/Plantar flexion intact Incision: dressing C/D/I   Lab Results  Component Value Date   WBC 8.6 01/31/2021   HGB 10.2 (L) 01/31/2021   HCT 31.3 (L) 01/31/2021   MCV 88.9 01/31/2021   PLT 168 01/31/2021   BMET    Component Value Date/Time   NA 138 01/31/2021 0303   NA 143 01/14/2021 1616   K 3.7 01/31/2021 0303   CL 106 01/31/2021 0303   CO2 27 01/31/2021 0303   GLUCOSE 118 (H) 01/31/2021 0303   BUN 11 01/31/2021 0303   BUN 11 01/14/2021 1616   CREATININE 0.60 01/31/2021 0303   CALCIUM 8.9 01/31/2021 0303   GFRNONAA >60 01/31/2021 0303   GFRAA 107 06/28/2020 1158     Assessment/Plan: 1 Day Post-Op   Principal Problem:   Primary osteoarthritis of left hip   WBAT with walker DVT ppx: Aspirin, SCDs, TEDS PO pain control PT/OT 531mL normal saline bolus ordered to help with orthostatic changes Dispo: D/C home once cleared by therapy     Dorothyann Peng 01/31/2021, 1:40 PM   [facility] is now [employer] [address], [place], [place] [address] Phone: [phone] [email or web address]  [employer]

## 2021-01-31 NOTE — Progress Notes (Signed)
Physical Therapy Treatment Patient Details Name: Allison Cannon MRN: [id number] DOB: 1955/12/12 Today's Date: 01/31/2021    History of Present Illness Patient is 65 y.o. female s/p Lt THA anterior approach on 01/30/21 with PMH significant for MI, AICD placement.    PT Comments    Patient seen for additional therapy session this PM and pt's husband present for training. She demonstrated good carryover for transfers and gait with RW. Pt denied dizziness throughout session and VSS throughout (116/74 part way through gait, and 116/78 at end of gait). Patient performed single step negotiation with assist from husband and cues for safety from therapist. Educated pt on HEP including walking daily. At this time pt is safe to return home with assist from husband from mobility standpoint. Acute PT will continue to progress as able during her stay.   Follow Up Recommendations  Follow surgeon's recommendation for DC plan and follow-up therapies     Equipment Recommendations  Rolling walker with 5" wheels    Recommendations for Other Services       Precautions / Restrictions Precautions Precautions: Fall Restrictions Weight Bearing Restrictions: No    Mobility  Bed Mobility Overal bed mobility: Needs Assistance Bed Mobility: Supine to Sit     Supine to sit: Supervision;HOB elevated     General bed mobility comments: pt OOB in recliner with NT at side, just finished using BSC.    Transfers Overall transfer level: Needs assistance Equipment used: Rolling walker (2 wheeled) Transfers: Sit to/from Stand Sit to Stand: Supervision         General transfer comment: pt with good carryover for safe power up and no cues or assist needed to rise from recliner.  Ambulation/Gait Ambulation/Gait assistance: Min guard;Supervision Gait Distance (Feet): 200 Feet Assistive device: Rolling walker (2 wheeled) Gait Pattern/deviations: Decreased stride length;Step-through pattern Gait velocity:  decr   General Gait Details: good carryover for safe proxmity to walker and step through pattern. no overt LOB. Pt denied dizzy/lightheaded sensation. BP assessed after ~130' gait in sitting and stable 116/74 mmHg and at end of total gait in standing 116/78 mmhg. pt's husband present and provided safe guarding during gait with cues from therapist.   Stairs Stairs: Yes Stairs assistance: Min guard Stair Management: No rails;Step to pattern;Forwards;With walker Number of Stairs: 1 General stair comments: cues for safe step pattern "up with good, down with bad" and walker placement. Educated pt and husbandon safe gaurding/assist position and pt/family completed step with supervision/guard from therapist.   Wheelchair Mobility    Modified Rankin (Stroke Patients Only)       Balance Overall balance assessment: Needs assistance Sitting-balance support: Feet supported Sitting balance-Leahy Scale: Good     Standing balance support: During functional activity;Bilateral upper extremity supported Standing balance-Leahy Scale: Fair                              Cognition Arousal/Alertness: Awake/alert Behavior During Therapy: WFL for tasks assessed/performed Overall Cognitive Status: Within Functional Limits for tasks assessed                                        Exercises Total Joint Exercises Ankle Circles/Pumps: AROM;Both;20 reps;Seated Quad Sets: AROM;Left;Other reps (comment);Seated (3) Short Arc Quad: AROM;Left;Other reps (comment);Seated (3) Heel Slides: AROM;Left;Other reps (comment);Seated (3) Hip ABduction/ADduction: AROM;Left;Other reps (comment);Seated (3)    General Comments  Pertinent Vitals/Pain Pain Assessment: 0-10 Pain Score: 2  Pain Descriptors / Indicators: Aching;Tightness Pain Intervention(s): Limited activity within patient's tolerance;Monitored during session;Repositioned;Ice applied    Home Living                       Prior Function            PT Goals (current goals can now be found in the care plan section) Acute Rehab PT Goals Patient Stated Goal: get back to her exercise classes at the [facility] PT Goal Formulation: With patient Time For Goal Achievement: 02/06/21 Potential to Achieve Goals: Good Progress towards PT goals: Progressing toward goals    Frequency    7X/week      PT Plan Current plan remains appropriate    Co-evaluation              AM-PAC PT "6 Clicks" Mobility   Outcome Measure  Help needed turning from your back to your side while in a flat bed without using bedrails?: None Help needed moving from lying on your back to sitting on the side of a flat bed without using bedrails?: A Little Help needed moving to and from a bed to a chair (including a wheelchair)?: A Little Help needed standing up from a chair using your arms (e.g., wheelchair or bedside chair)?: A Little Help needed to walk in hospital room?: A Little Help needed climbing 3-5 steps with a railing? : A Little 6 Click Score: 19    End of Session Equipment Utilized During Treatment: Gait belt Activity Tolerance: Patient tolerated treatment well Patient left: with call bell/phone within reach;in chair;with chair alarm set Nurse Communication: Mobility status PT Visit Diagnosis: Muscle weakness (generalized) (M62.81);Difficulty in walking, not elsewhere classified (R26.2)     Time: 3646-8032 PT Time Calculation (min) (ACUTE ONLY): 32 min  Charges:  $Gait Training: 8-22 mins $Therapeutic Exercise: 8-22 mins $Therapeutic Activity: 8-22 mins                     Verner Mould, DPT Acute Rehabilitation Services Office [phone] Pager [phone]     Jacques Navy 01/31/2021, 3:09 PM

## 2021-01-31 NOTE — TOC Transition Note (Signed)
Transition of Care [facility]) - CM/SW Discharge Note  Patient Details  Name: Allison Cannon MRN: [id number] Date of Birth: May 25, 1956  [facility] of [facility]) CM/SW Contact:  Sherie Don, LCSW Phone Number: 01/31/2021, 10:19 AM  Clinical Narrative: Patient expected to discharge home after working with PT. CSW met with patient to confirm discharge plan. Per PA, patient will discharge home with a home exercise program (HEP). Patient will need a rolling walker; [employer] to deliver walker to patient's room. No additional needs identified. TOC signing off.  Final next level of care: Home/Self Care Barriers to Discharge: No Barriers Identified  Patient Goals and CMS Choice Patient states their goals for this hospitalization and ongoing recovery are:: Discharge home with HEP CMS Medicare.gov Compare Post Acute Care list provided to:: Patient Choice offered to / list presented to : Patient  Discharge Plan and Services        DME Arranged: Walker rolling DME Agency: [employer] Representative spoke with at DME Agency: Pre-arranged in orthopedist's office  Readmission Risk Interventions No flowsheet data found.

## 2021-01-31 NOTE — Progress Notes (Addendum)
Physical Therapy Treatment Patient Details Name: Allison Cannon MRN: [id number] DOB: [birth year] Today's Date: 01/31/2021    History of Present Illness Patient is 65 y.o. female s/p Lt THA anterior approach on 01/30/21 with PMH significant for MI, AICD placement.    PT Comments    Patient progressed gait distance this AM session. She required supervision for bed mobility and and sit<>stand with RW. Gait distance increased to ~70' with pt performing step through pattern with min guard assist. End of gait pt c/o dizziness and seated rest provided (noted to be orthostatic with gait- see vitals below). Symptoms improved with sitting. Acute PT will follow up for additional therapy to assess tolerance to mobility and initiate HEP.  Vitals Positions                                             BP(mmHg) Seated EOB                                        110/74 64 Seated after gait                                  77/58 89 ~3 minutes seated rest                       102/61 61    Follow Up Recommendations  Follow surgeon's recommendation for DC plan and follow-up therapies     Equipment Recommendations  Rolling walker with 5" wheels    Recommendations for Other Services       Precautions / Restrictions Precautions Precautions: Fall Restrictions Weight Bearing Restrictions: No    Mobility  Bed Mobility Overal bed mobility: Needs Assistance Bed Mobility: Supine to Sit     Supine to sit: Supervision;HOB elevated     General bed mobility comments: supervision for safety, no assist or cues needed. HOB slightly elevated.    Transfers Overall transfer level: Needs assistance Equipment used: Rolling walker (2 wheeled) Transfers: Sit to/from Stand Sit to Stand: Supervision         General transfer comment: cues for safe hand placement, light assist to steady with rise but pt initiated power up independently  Ambulation/Gait Ambulation/Gait assistance: Min assist;Min guard Gait  Distance (Feet): 70 Feet Assistive device: Rolling walker (2 wheeled) Gait Pattern/deviations: Decreased stride length;Step-through pattern Gait velocity: decr   General Gait Details: pt maintained safe proximity to RW, progressed to step through pattern with decreased step length, pt being cautious. no overt LOB and progressed to min guard over dsitance. pt c/o lightheadednes/dizzy feeling and seated rest provided.   Stairs             Wheelchair Mobility    Modified Rankin (Stroke Patients Only)       Balance Overall balance assessment: Needs assistance Sitting-balance support: Feet supported Sitting balance-Leahy Scale: Good     Standing balance support: During functional activity;Bilateral upper extremity supported Standing balance-Leahy Scale: Fair                              Cognition Arousal/Alertness: Awake/alert Behavior During Therapy: WFL for tasks assessed/performed Overall Cognitive Status: Within  Functional Limits for tasks assessed                                        Exercises Total Joint Exercises Ankle Circles/Pumps: AROM;Both;20 reps;Seated    General Comments        Pertinent Vitals/Pain Pain Assessment: 0-10 Pain Score: 2  Pain Descriptors / Indicators: Aching;Tightness Pain Intervention(s): Limited activity within patient's tolerance;Premedicated before session;Monitored during session;Repositioned;Ice applied    Home Living                      Prior Function            PT Goals (current goals can now be found in the care plan section) Acute Rehab PT Goals Patient Stated Goal: get back to her exercise classes at the [facility] PT Goal Formulation: With patient Time For Goal Achievement: 02/06/21 Potential to Achieve Goals: Good Progress towards PT goals: Progressing toward goals    Frequency    7X/week      PT Plan Current plan remains appropriate    Co-evaluation               AM-PAC PT "6 Clicks" Mobility   Outcome Measure  Help needed turning from your back to your side while in a flat bed without using bedrails?: None Help needed moving from lying on your back to sitting on the side of a flat bed without using bedrails?: A Little Help needed moving to and from a bed to a chair (including a wheelchair)?: A Little Help needed standing up from a chair using your arms (e.g., wheelchair or bedside chair)?: A Little Help needed to walk in hospital room?: A Little Help needed climbing 3-5 steps with a railing? : A Little 6 Click Score: 19    End of Session Equipment Utilized During Treatment: Gait belt Activity Tolerance: Patient tolerated treatment well Patient left: with call bell/phone within reach;in chair;with chair alarm set Nurse Communication: Mobility status PT Visit Diagnosis: Muscle weakness (generalized) (M62.81);Difficulty in walking, not elsewhere classified (R26.2)     Time: 3887-1959 PT Time Calculation (min) (ACUTE ONLY): 27 min  Charges:  $Gait Training: 8-22 mins $Therapeutic Activity: 8-22 mins                     Allison Cannon, DPT Acute Rehabilitation Services Office [phone] Pager [phone]     Allison Cannon 01/31/2021, 2:56 PM

## 2021-01-31 NOTE — Plan of Care (Signed)
Plan of care reviewed and discussed with the patient. 

## 2021-01-31 NOTE — Discharge Summary (Signed)
Physician Discharge Summary  Patient ID: Allison Cannon MRN: [id number] DOB/AGE: May 02, 1956 65 y.o.  Admit date: 01/30/2021 Discharge date: 01/31/2021  Admission Diagnoses:  Primary osteoarthritis of left hip  Discharge Diagnoses:  Principal Problem:   Primary osteoarthritis of left hip   Past Medical History:  Diagnosis Date  . AICD (automatic cardioverter/defibrillator) present   . Arthritis of left hip   . Myocardial infarction ([place])   . PONV (postoperative nausea and vomiting)     Surgeries: Procedure(s): TOTAL HIP ARTHROPLASTY ANTERIOR APPROACH on 01/30/2021   Consultants (if any):   Discharged Condition: Improved  Hospital Course: Allison Cannon is an 65 y.o. female who was admitted 01/30/2021 with a diagnosis of Primary osteoarthritis of left hip and went to the operating room on 01/30/2021 and underwent the above named procedures.    She was given perioperative antibiotics:  Anti-infectives (From admission, onward)   Start     Dose/Rate Route Frequency Ordered Stop   01/30/21 1400  ceFAZolin (ANCEF) IVPB 2g/100 mL premix        2 g 200 mL/hr over 30 Minutes Intravenous Every 6 hours 01/30/21 1133 01/30/21 2022   01/30/21 0600  ceFAZolin (ANCEF) IVPB 2g/100 mL premix        2 g 200 mL/hr over 30 Minutes Intravenous On call to O.R. 01/30/21 [id number] 01/30/21 0736    .  She was given sequential compression devices, early ambulation, and Aspirin for DVT prophylaxis.  She benefited maximally from the hospital stay and there were no complications.    Recent vital signs:  Vitals:   01/31/21 1055 01/31/21 1406  BP: 104/61 106/65  Pulse: 70 84  Resp: 17 17  Temp: 97.8 F (36.6 C) 98.3 F (36.8 C)  SpO2:  96%    Recent laboratory studies:  Lab Results  Component Value Date   HGB 10.2 (L) 01/31/2021   HGB 14.2 01/21/2021   HGB 13.9 01/14/2021   Lab Results  Component Value Date   WBC 8.6 01/31/2021   PLT 168 01/31/2021   Lab Results  Component Value Date    INR 1.0 01/21/2021   Lab Results  Component Value Date   NA 138 01/31/2021   K 3.7 01/31/2021   CL 106 01/31/2021   CO2 27 01/31/2021   BUN 11 01/31/2021   CREATININE 0.60 01/31/2021   GLUCOSE 118 (H) 01/31/2021     WEIGHT BEARING   Weight bearing as tolerated with assist device (walker, cane, etc) as directed, use it as long as suggested by your surgeon or therapist, typically at least 4-6 weeks.   EXERCISES  Results after joint replacement surgery are often greatly improved when you follow the exercise, range of motion and muscle strengthening exercises prescribed by your doctor. Safety measures are also important to protect the joint from further injury. Any time any of these exercises cause you to have increased pain or swelling, decrease what you are doing until you are comfortable again and then slowly increase them. If you have problems or questions, call your caregiver or physical therapist for advice.   Rehabilitation is important following a joint replacement. After just a few days of immobilization, the muscles of the leg can become weakened and shrink (atrophy).  These exercises are designed to build up the tone and strength of the thigh and leg muscles and to improve motion. Often times heat used for twenty to thirty minutes before working out will loosen up your tissues and help with improving the range of motion  but do not use heat for the first two weeks following surgery (sometimes heat can increase post-operative swelling).   These exercises can be done on a training (exercise) mat, on the floor, on a table or on a bed. Use whatever works the best and is most comfortable for you.    Use music or television while you are exercising so that the exercises are a pleasant break in your day. This will make your life better with the exercises acting as a break in your routine that you can look forward to.   Perform all exercises about fifteen times, three times per day or as  directed.  You should exercise both the operative leg and the other leg as well.  Exercises include:   . Quad Sets - Tighten up the muscle on the front of the thigh (Quad) and hold for 5-10 seconds.   . Straight Leg Raises - With your knee straight (if you were given a brace, keep it on), lift the leg to 60 degrees, hold for 3 seconds, and slowly lower the leg.  Perform this exercise against resistance later as your leg gets stronger.  . Leg Slides: Lying on your back, slowly slide your foot toward your buttocks, bending your knee up off the floor (only go as far as is comfortable). Then slowly slide your foot back down until your leg is flat on the floor again.  Glenard Haring Wings: Lying on your back spread your legs to the side as far apart as you can without causing discomfort.  . Hamstring Strength:  Lying on your back, push your heel against the floor with your leg straight by tightening up the muscles of your buttocks.  Repeat, but this time bend your knee to a comfortable angle, and push your heel against the floor.  You may put a pillow under the heel to make it more comfortable if necessary.   A rehabilitation program following joint replacement surgery can speed recovery and prevent re-injury in the future due to weakened muscles. Contact your doctor or a physical therapist for more information on knee rehabilitation.    CONSTIPATION  Constipation is defined medically as fewer than three stools per week and severe constipation as less than one stool per week.  Even if you have a regular bowel pattern at home, your normal regimen is likely to be disrupted due to multiple reasons following surgery.  Combination of anesthesia, postoperative narcotics, change in appetite and fluid intake all can affect your bowels.   YOU MUST use at least one of the following options; they are listed in order of increasing strength to get the job done.  They are all available over the counter, and you may need to  use some, POSSIBLY even all of these options:    Drink plenty of fluids (prune juice may be helpful) and high fiber foods Colace 100 mg by mouth twice a day  Senokot for constipation as directed and as needed Dulcolax (bisacodyl), take with full glass of water  Miralax (polyethylene glycol) once or twice a day as needed.  If you have tried all these things and are unable to have a bowel movement in the first 3-4 days after surgery call either your surgeon or your primary doctor.    If you experience loose stools or diarrhea, hold the medications until you stool forms back up.  If your symptoms do not get better within 1 week or if they get worse, check with your doctor.  If you experience "the worst abdominal pain ever" or develop nausea or vomiting, please contact the office immediately for further recommendations for treatment.   ITCHING:  If you experience itching with your medications, try taking only a single pain pill, or even half a pain pill at a time.  You can also use Benadryl over the counter for itching or also to help with sleep.   TED HOSE STOCKINGS:  Use stockings on both legs until for at least 2 weeks or as directed by physician office. They may be removed at night for sleeping.  MEDICATIONS:  See your medication summary on the "After Visit Summary" that nursing will review with you.  You may have some home medications which will be placed on hold until you complete the course of blood thinner medication.  It is important for you to complete the blood thinner medication as prescribed.  PRECAUTIONS:  If you experience chest pain or shortness of breath - call 911 immediately for transfer to the hospital emergency department.   If you develop a fever greater that 101 F, purulent drainage from wound, increased redness or drainage from wound, foul odor from the wound/dressing, or calf pain - CONTACT YOUR SURGEON.                                                   FOLLOW-UP  APPOINTMENTS:  If you do not already have a post-op appointment, please call the office for an appointment to be seen by your surgeon.  Guidelines for how soon to be seen are listed in your "After Visit Summary", but are typically between 1-4 weeks after surgery.  OTHER INSTRUCTIONS:   Knee Replacement:  Do not place pillow under knee, focus on keeping the knee straight while resting. CPM instructions: 0-90 degrees, 2 hours in the morning, 2 hours in the afternoon, and 2 hours in the evening. Place foam block, curve side up under heel at all times except when in CPM or when walking.  DO NOT modify, tear, cut, or change the foam block in any way.  POST-OPERATIVE OPIOID TAPER INSTRUCTIONS: . It is important to wean off of your opioid medication as soon as possible. If you do not need pain medication after your surgery it is ok to stop day one. Marland Kitchen Opioids include: o Codeine, Hydrocodone(Norco, Vicodin), Oxycodone(Percocet, oxycontin) and hydromorphone amongst others.  . Long term and even short term use of opiods can cause: o Increased pain response o Dependence o Constipation o Depression o Respiratory depression o And more.  . Withdrawal symptoms can include o Flu like symptoms o Nausea, vomiting o And more . Techniques to manage these symptoms o Hydrate well o Eat regular healthy meals o Stay active o Use relaxation techniques(deep breathing, meditating, yoga) . Do Not substitute Alcohol to help with tapering . If you have been on opioids for less than two weeks and do not have pain than it is ok to stop all together.  . Plan to wean off of opioids o This plan should start within one week post op of your joint replacement. o Maintain the same interval or time between taking each dose and first decrease the dose.  o Cut the total daily intake of opioids by one tablet each day o Next start to increase the time between doses. o The last dose that  should be eliminated is the evening dose.    Dental Antibiotics:  In most cases prophylactic antibiotics for Dental procdeures after total joint surgery are not necessary.  Exceptions are as follows:  1. History of prior total joint infection  2. Severely immunocompromised (Organ Transplant, cancer chemotherapy, Rheumatoid biologic meds such as [place])  3. Poorly controlled diabetes (A1C &gt; 8.0, blood glucose over 200)  If you have one of these conditions, contact your surgeon for an antibiotic prescription, prior to your dental procedure.   MAKE SURE YOU:  . Understand these instructions.  . Get help right away if you are not doing well or get worse.    Thank you for letting us be a part of your medical care team.  It is a privilege we respect greatly.  We hope these instructions will help you stay on track for a fast and full recovery!   Diagnostic Studies: DG Pelvis Portable  Result Date: 01/30/2021 CLINICAL DATA:  Post hip surgery EXAM: PORTABLE PELVIS 1-2 VIEWS COMPARISON:  None. FINDINGS: Changes of left hip replacement. No hardware bony complicating feature. Normal AP alignment. IMPRESSION: Left hip replacement.  No visible complicating feature. Electronically Signed   By: Rolm Baptise M.D.   On: 01/30/2021 10:53   DG C-Arm 1-60 Min-No Report  Result Date: 01/30/2021 CLINICAL DATA:  Status post left total hip replacement EXAM: OPERATIVE LEFT HIP   1 VIEW TECHNIQUE: Fluoroscopic spot image(s) were submitted for interpretation post-operatively. COMPARISON:  November 07, 2019 FLUOROSCOPY TIME:  0 minutes 20 seconds; 6 acquired images FINDINGS: Frontal views obtained. There is a total hip replacement on the left with prosthetic components well-seated on frontal view. No fracture or dislocation. Right hip joint appears normal. IMPRESSION: Total hip replacement on the left with prosthetic components well seated. No fracture or dislocation. Normal appearing right hip joint frontal view. Electronically Signed   By: Lowella Grip III M.D.   On: 01/30/2021 09:32   CUP PACEART INCLINIC DEVICE CHECK  Result Date: 01/22/2021 ICD check in clinic. Normal device function. Thresholds and sensing consistent with previous device measurements. Impedance trends stable over time. No ventricular arrhythmias. Histogram distribution appropriate for patient and level of activity. No changes made this session. Device programmed at appropriate safety margins. Device programmed to optimize intrinsic conduction. Estimated longevity __9 years_. Pt enrolled in remote follow-up.  DG HIP OPERATIVE UNILAT W OR W/O PELVIS LEFT  Result Date: 01/30/2021 CLINICAL DATA:  Status post left total hip replacement EXAM: OPERATIVE LEFT HIP   1 VIEW TECHNIQUE: Fluoroscopic spot image(s) were submitted for interpretation post-operatively. COMPARISON:  November 07, 2019 FLUOROSCOPY TIME:  0 minutes 20 seconds; 6 acquired images FINDINGS: Frontal views obtained. There is a total hip replacement on the left with prosthetic components well-seated on frontal view. No fracture or dislocation. Right hip joint appears normal. IMPRESSION: Total hip replacement on the left with prosthetic components well seated. No fracture or dislocation. Normal appearing right hip joint frontal view. Electronically Signed   By: Lowella Grip III M.D.   On: 01/30/2021 09:32    Disposition: Discharge disposition: 01-Home or Self Care       Discharge Instructions    Call MD / Call 911   Complete by: As directed    If you experience chest pain or shortness of breath, CALL 911 and be transported to the hospital emergency room.  If you develope a fever above 101 F, pus (white drainage) or increased drainage or redness at the wound,  or calf pain, call your surgeon's office.   Constipation Prevention   Complete by: As directed    Drink plenty of fluids.  Prune juice may be helpful.  You may use a stool softener, such as Colace (over the counter) 100 mg twice a day.  Use MiraLax  (over the counter) for constipation as needed.   Diet - low sodium heart healthy   Complete by: As directed    Driving restrictions   Complete by: As directed    No driving for 6 weeks   Increase activity slowly as tolerated   Complete by: As directed    Lifting restrictions   Complete by: As directed    No lifting for 6 weeks   TED hose   Complete by: As directed    Use stockings (TED hose) for 2 weeks on both leg(s).  You may remove them at night for sleeping.       Follow-up Information    Swinteck, Aaron Edelman, MD. Schedule an appointment as soon as possible for a visit in 2 weeks.   Specialty: Orthopedic Surgery Why: For wound re-check Contact information: [address] [place] [address] [phone]                Signed: Dorothyann Peng 01/31/2021, 5:38 PM

## 2021-01-31 NOTE — Plan of Care (Signed)
Pt ready to DC home with husband 

## 2021-02-02 ENCOUNTER — Encounter (HOSPITAL_COMMUNITY): Payer: Self-pay | Admitting: Orthopedic Surgery

## 2021-02-06 ENCOUNTER — Telehealth: Payer: Self-pay | Admitting: Internal Medicine

## 2021-02-06 NOTE — Telephone Encounter (Signed)
Left message for pt to call me back. Need to ask screening questions

## 2021-03-25 ENCOUNTER — Ambulatory Visit (INDEPENDENT_AMBULATORY_CARE_PROVIDER_SITE_OTHER): Payer: Medicare Other

## 2021-03-25 DIAGNOSIS — I469 Cardiac arrest, cause unspecified: Secondary | ICD-10-CM

## 2021-03-25 LAB — CUP PACEART REMOTE DEVICE CHECK
Battery Remaining Longevity: 109 mo
Battery Remaining Percentage: 88 %
Battery Voltage: 2.99 V
Brady Statistic RV Percent Paced: 1 %
Date Time Interrogation Session: 20220607033628
HighPow Impedance: 84 Ohm
Implantable Lead Implant Date: 20210603
Implantable Lead Location: 753860
Implantable Pulse Generator Implant Date: 20210603
Lead Channel Impedance Value: 440 Ohm
Lead Channel Pacing Threshold Amplitude: 0.75 V
Lead Channel Pacing Threshold Pulse Width: 0.5 ms
Lead Channel Sensing Intrinsic Amplitude: 12 mV
Lead Channel Setting Pacing Amplitude: 2.5 V
Lead Channel Setting Pacing Pulse Width: 0.5 ms
Lead Channel Setting Sensing Sensitivity: 0.5 mV
Pulse Gen Serial Number: 111007674

## 2021-04-16 NOTE — Progress Notes (Signed)
Remote ICD transmission.   

## 2021-06-08 IMAGING — DX DG PORTABLE PELVIS
1 series · 2 of 2 positions shown · non-contrast
Comparison: None.

CLINICAL DATA: Post hip surgery

EXAM:
PORTABLE PELVIS 1-2 VIEWS

[Series 1: pelvis ap · 0.14mm/px · 2 of 2 slices shown]
[im 1/2]
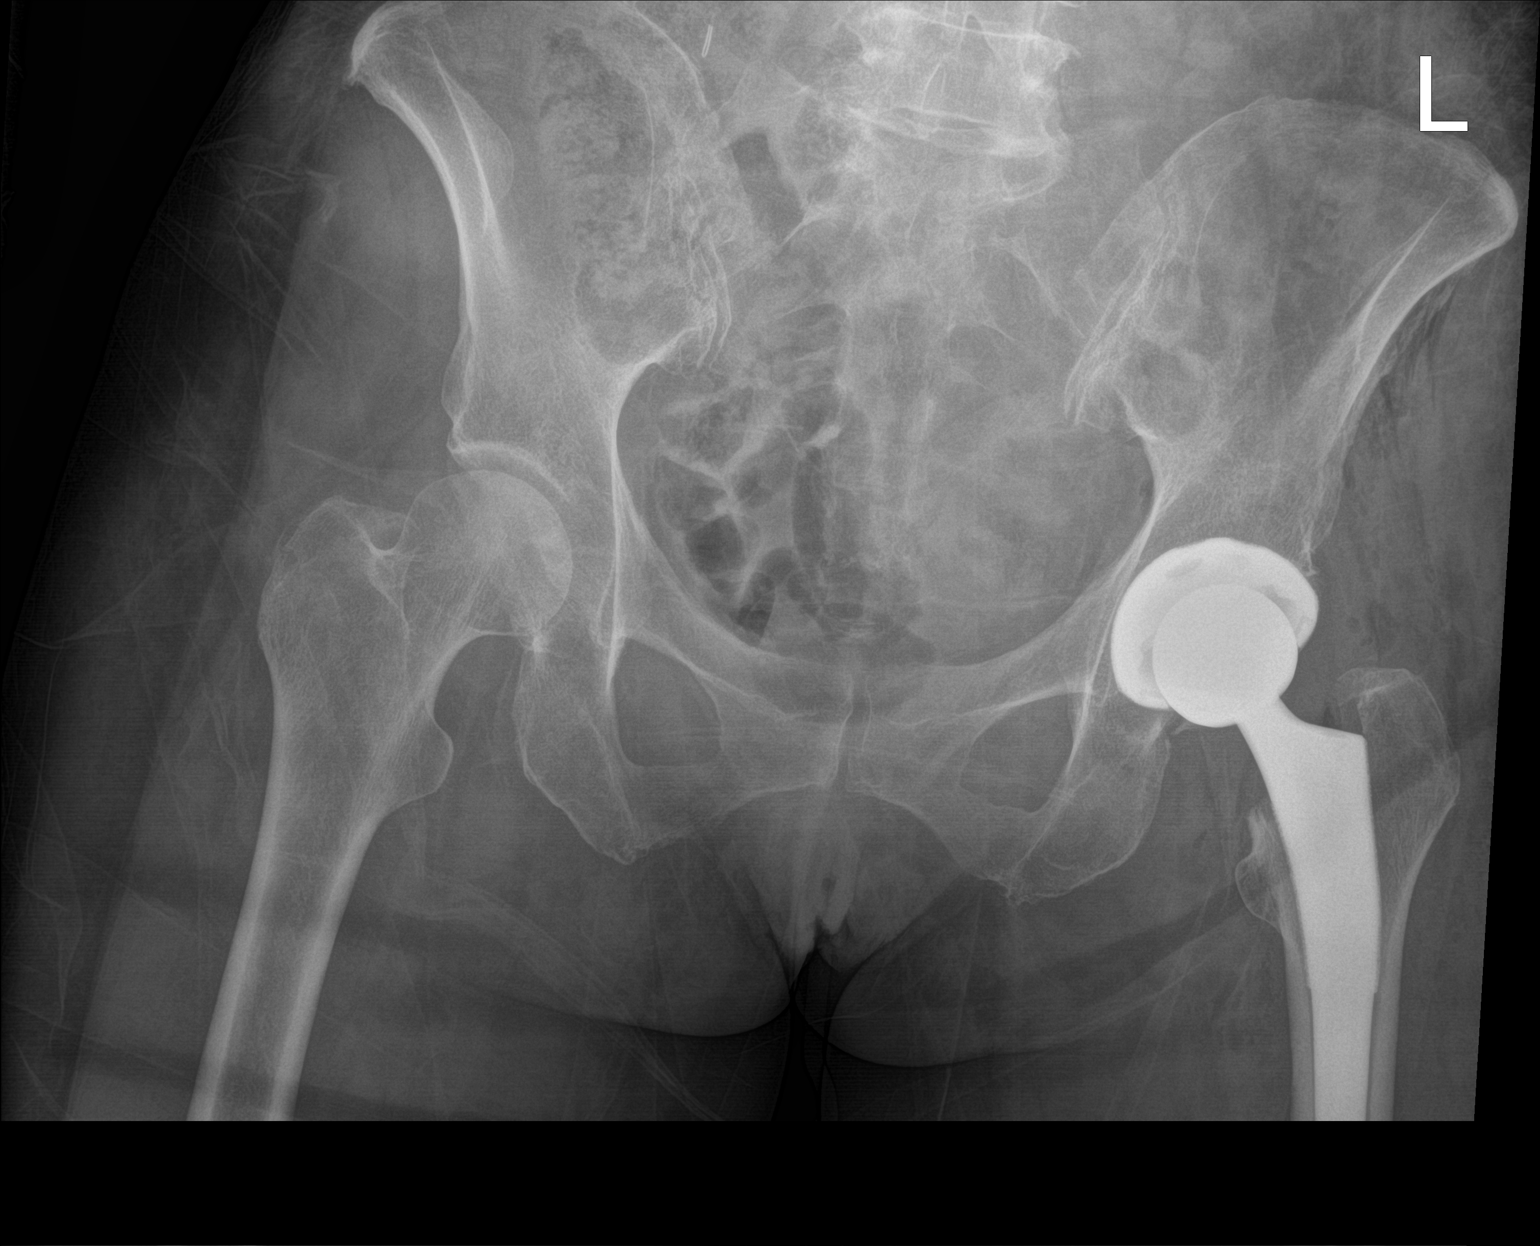
[im 2/2]
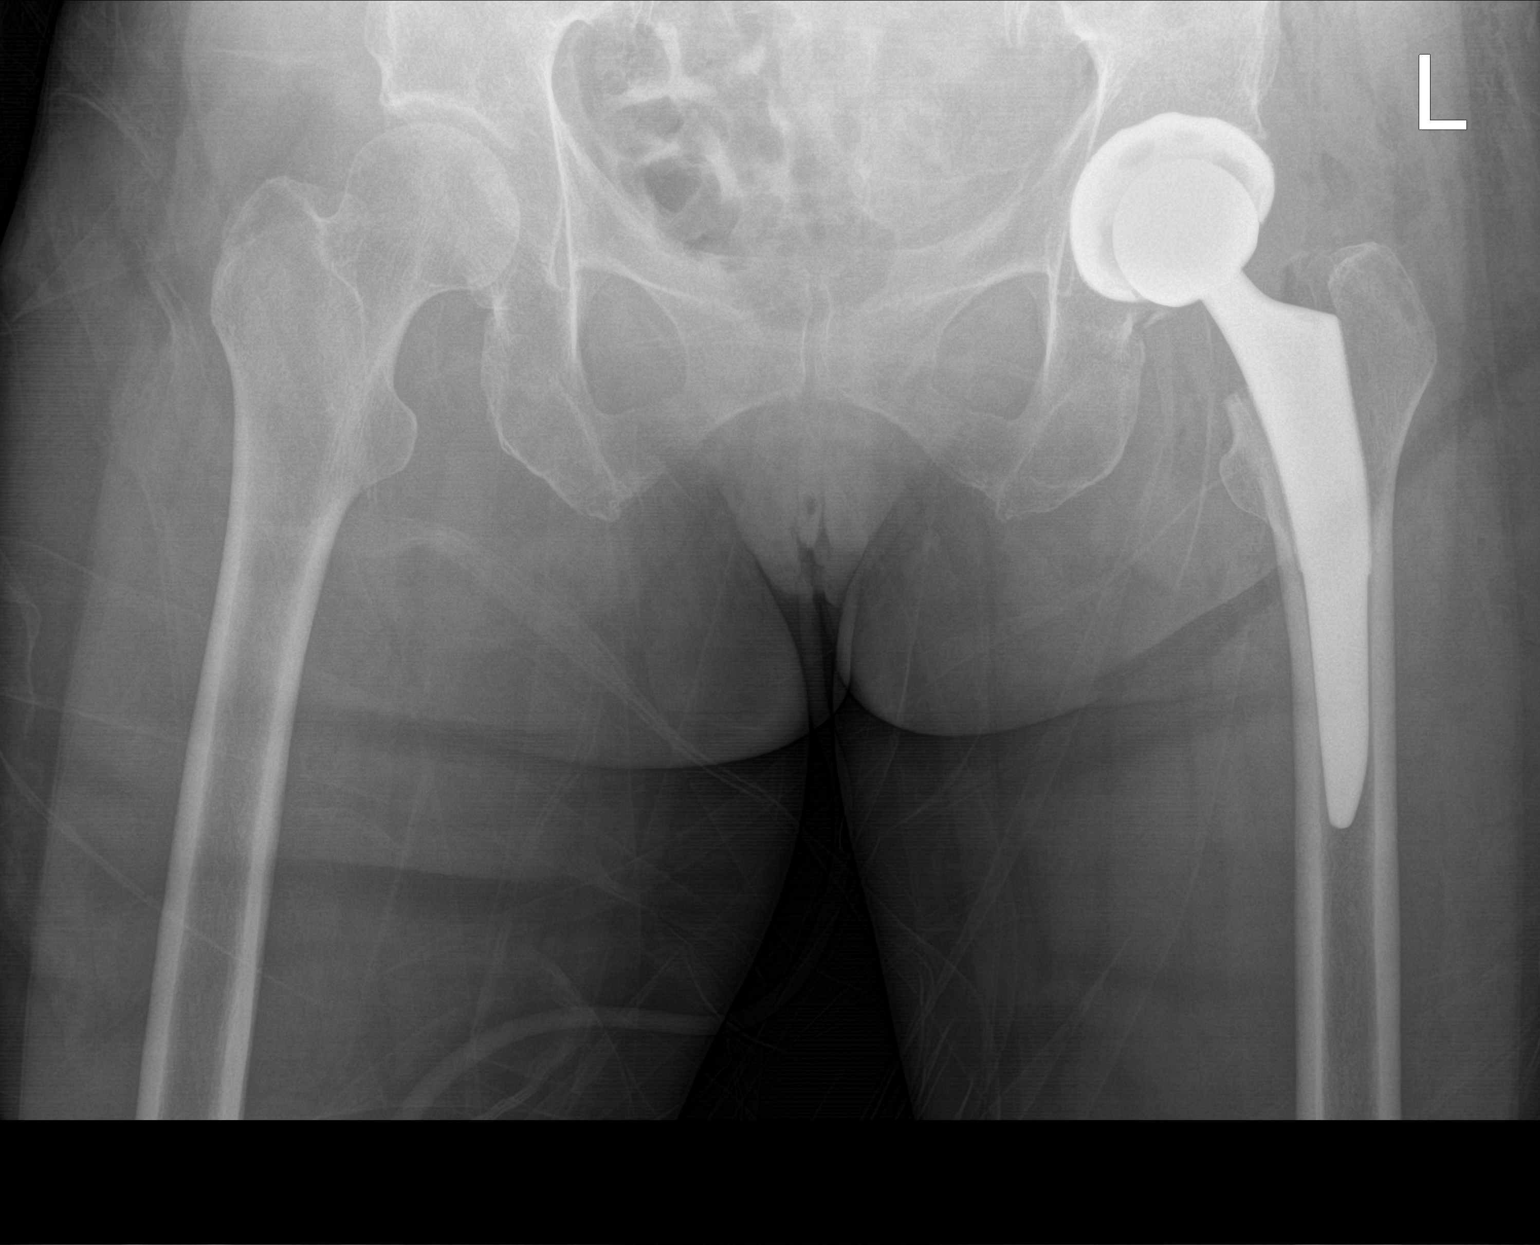

[2 of 2 positions shown; findings below may reference images not displayed]

FINDINGS: Changes of left hip replacement. No hardware bony complicating
feature. Normal AP alignment.
IMPRESSION: Left hip replacement.  No visible complicating feature.

## 2021-06-08 IMAGING — RF DG HIP (WITH PELVIS) OPERATIVE*L*
1 series · 6 of 6 positions shown · non-contrast
Comparison: November 07, 2019

FLUOROSCOPY TIME:  0 minutes 20 seconds; 6 acquired images

CLINICAL DATA: Status post left total hip replacement

EXAM:
OPERATIVE LEFT HIP   1 VIEW
TECHNIQUE: Fluoroscopic spot image(s) were submitted for interpretation
post-operatively.

[Series 1: unknown protocol · 0.20mm/px · 6 of 6 slices shown]
[im 1/6]
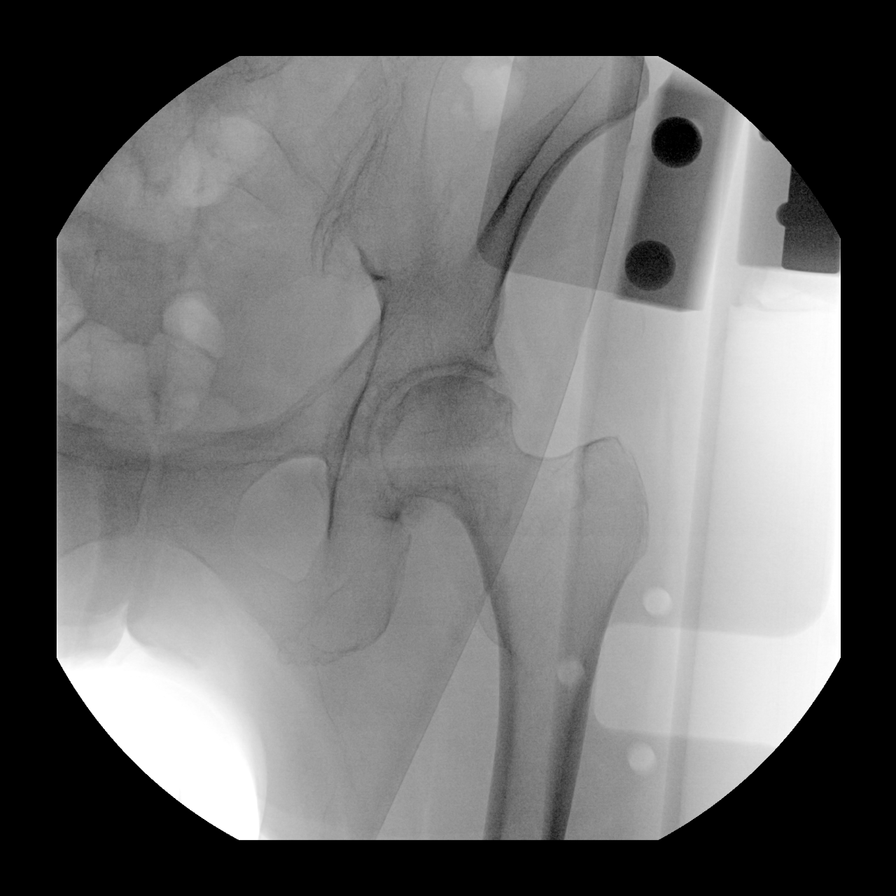
[im 2/6]
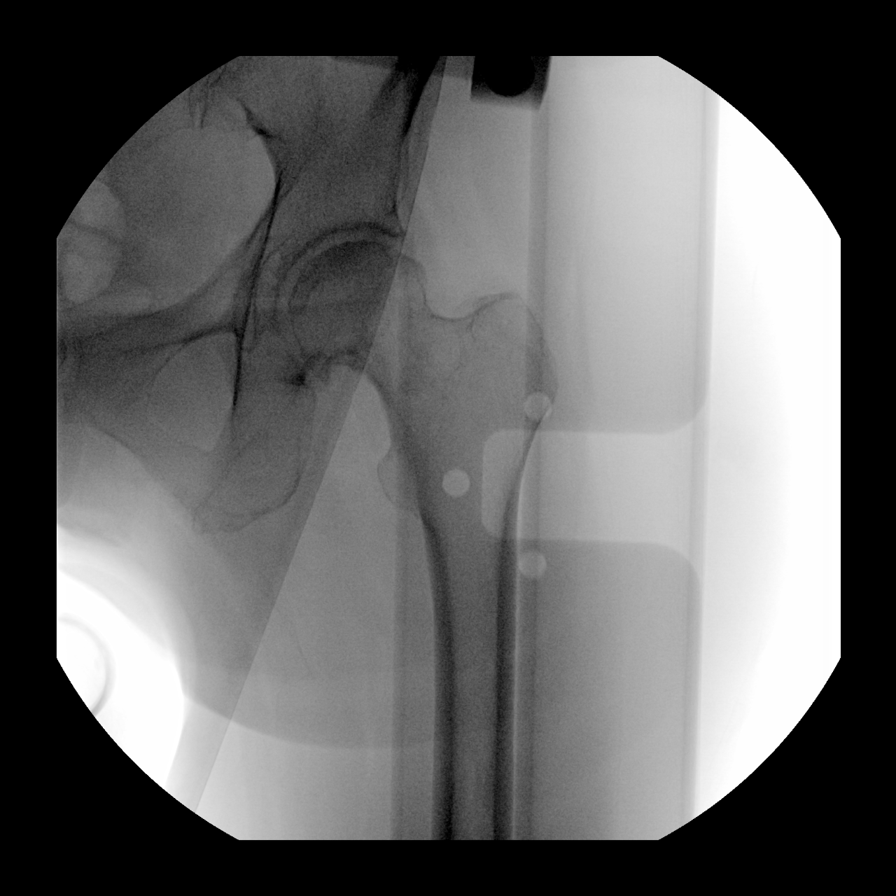
[im 3/6]
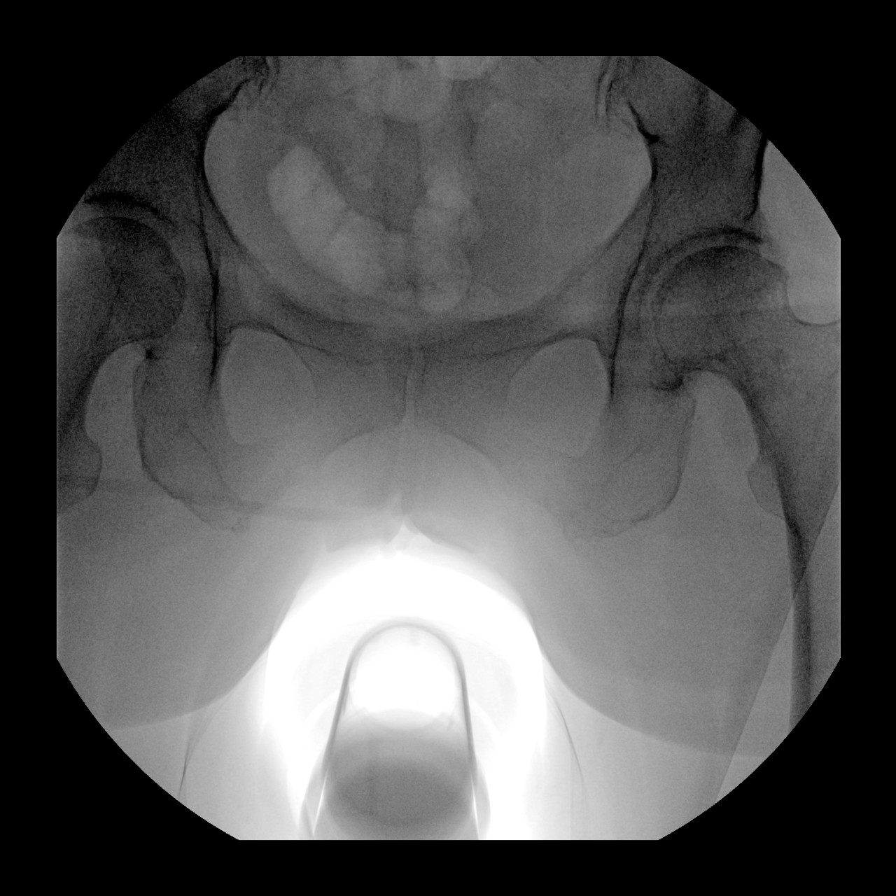
[im 4/6]
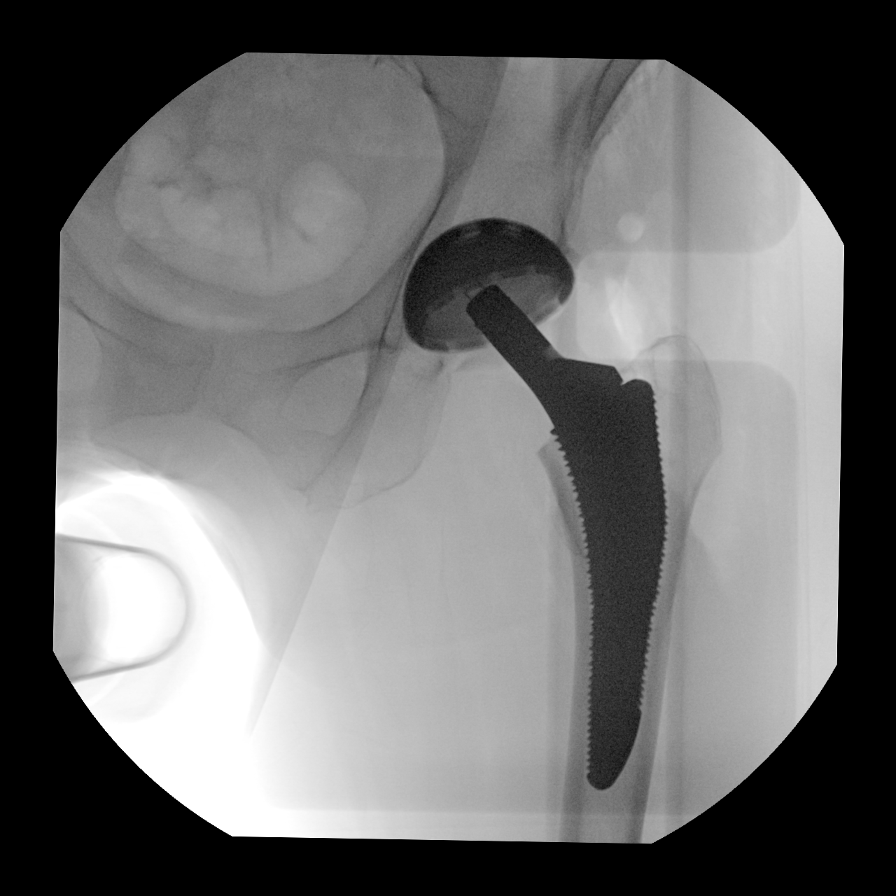
[im 5/6]
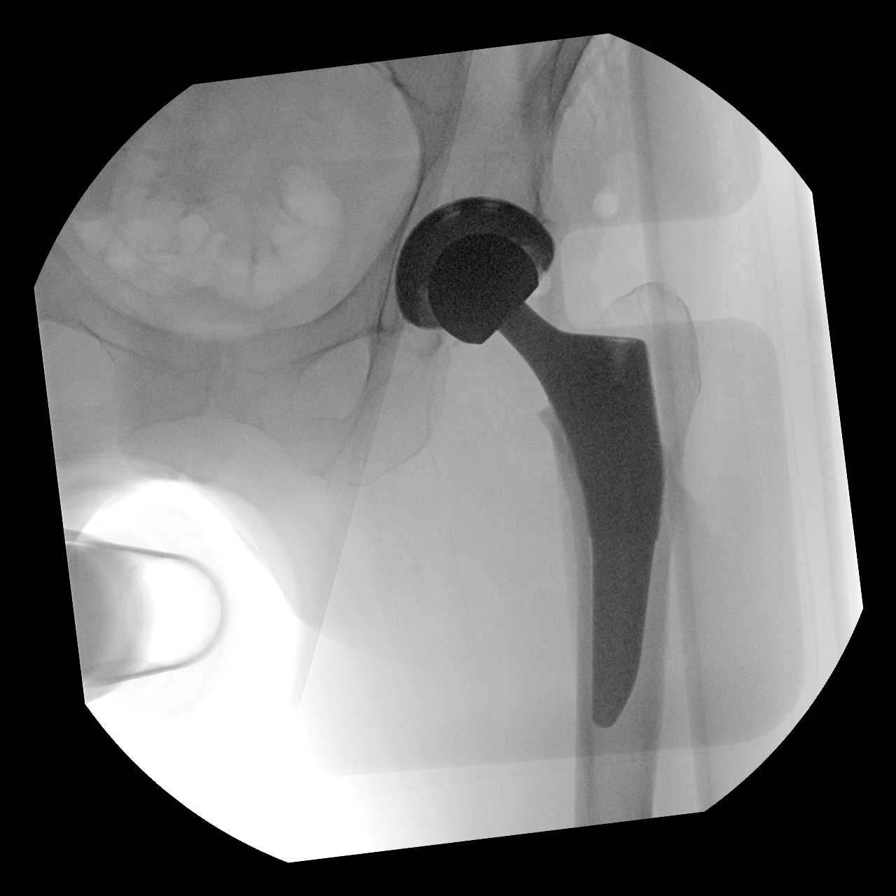
[im 6/6]
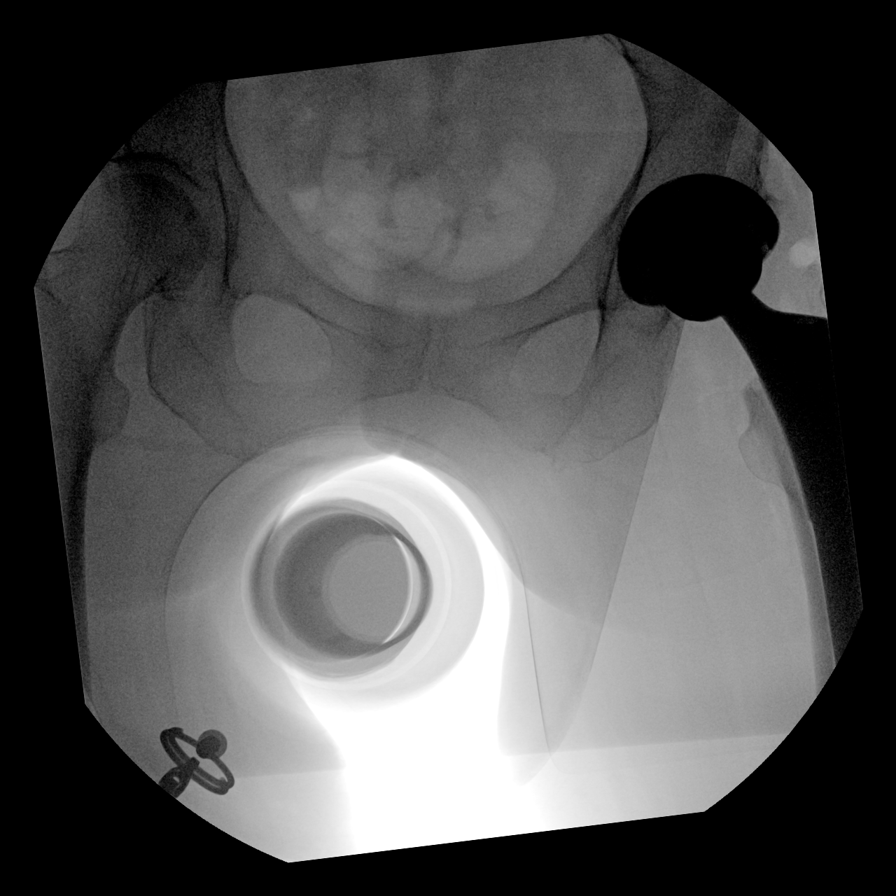

[6 of 6 positions shown; findings below may reference images not displayed]

FINDINGS: Frontal views obtained. There is a total hip replacement on the left
with prosthetic components well-seated on frontal view. No fracture
or dislocation. Right hip joint appears normal.
IMPRESSION: Total hip replacement on the left with prosthetic components well
seated. No fracture or dislocation. Normal appearing right hip joint
frontal view.

## 2021-06-08 IMAGING — RF DG C-ARM 1-60 MIN-NO REPORT
1 series · 6 of 6 positions shown · non-contrast
Comparison: November 07, 2019

FLUOROSCOPY TIME:  0 minutes 20 seconds; 6 acquired images

CLINICAL DATA: Status post left total hip replacement

EXAM:
OPERATIVE LEFT HIP   1 VIEW
TECHNIQUE: Fluoroscopic spot image(s) were submitted for interpretation
post-operatively.

[Series 1: unknown protocol · 0.20mm/px · 6 of 6 slices shown]
[im 1/6]
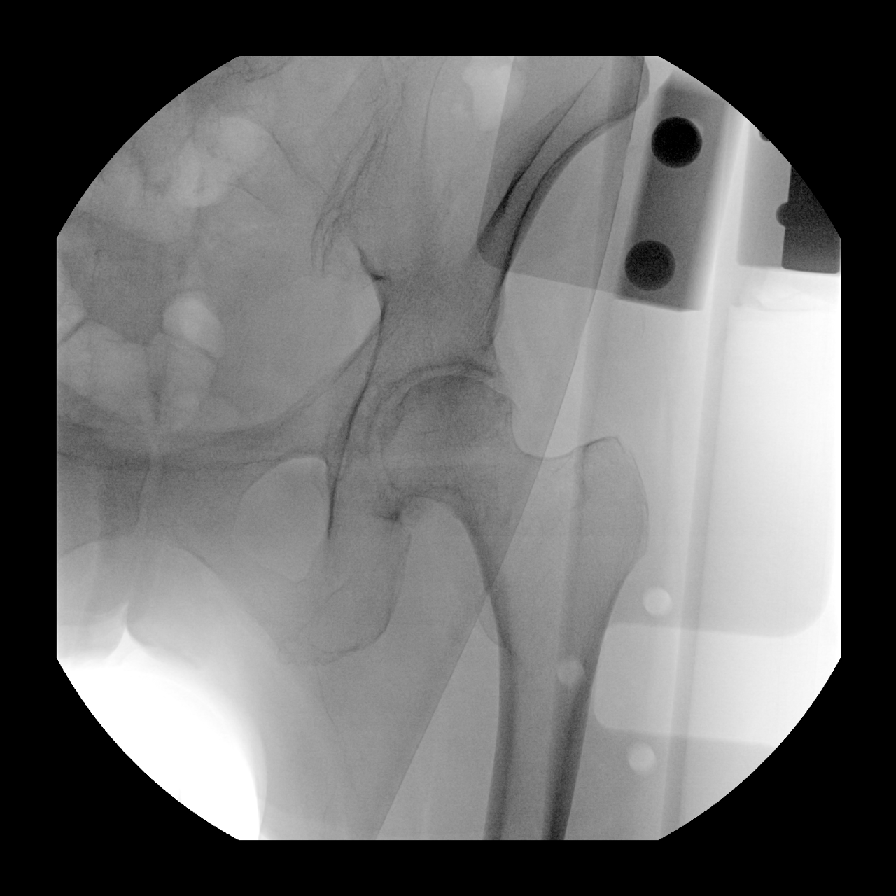
[im 2/6]
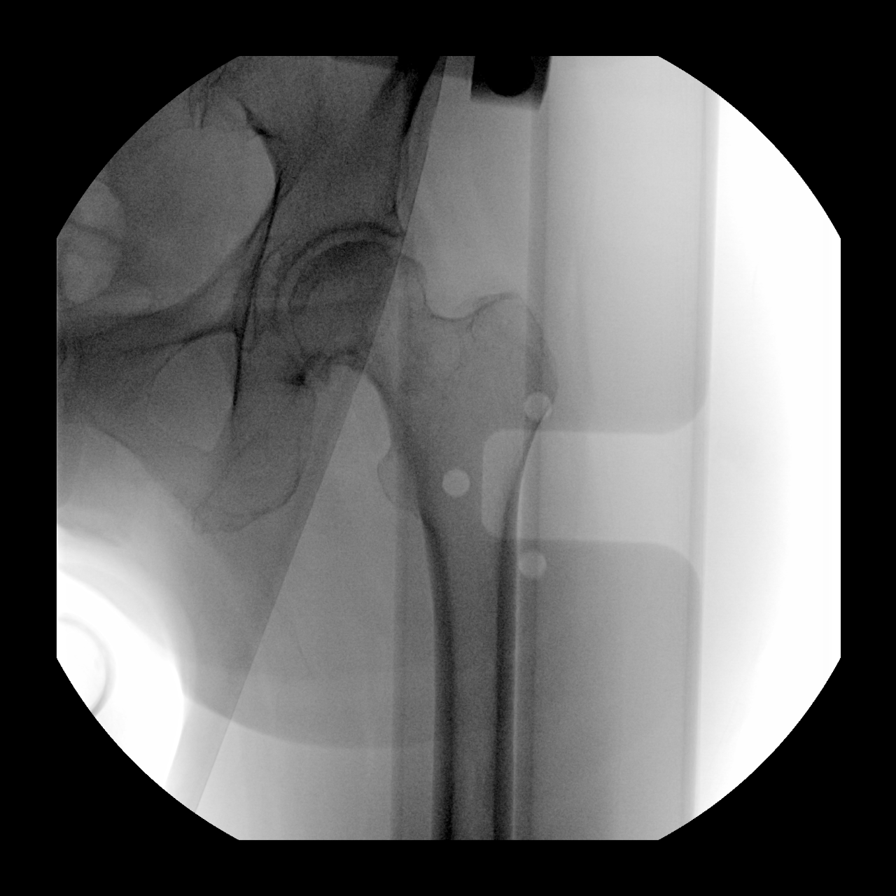
[im 3/6]
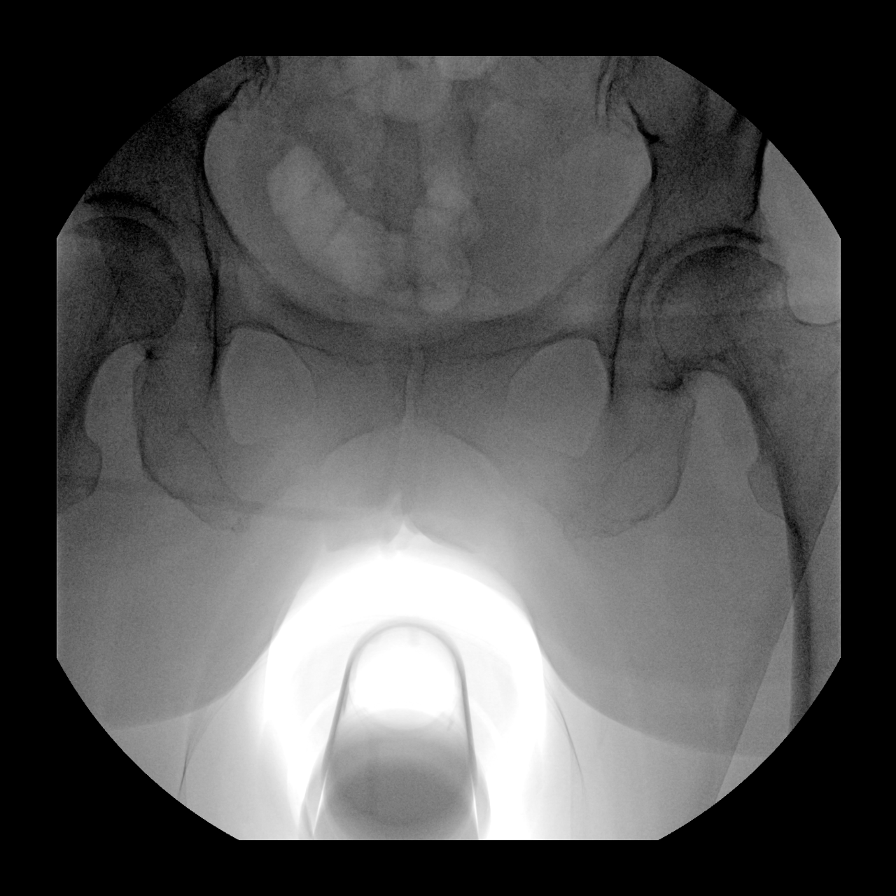
[im 4/6]
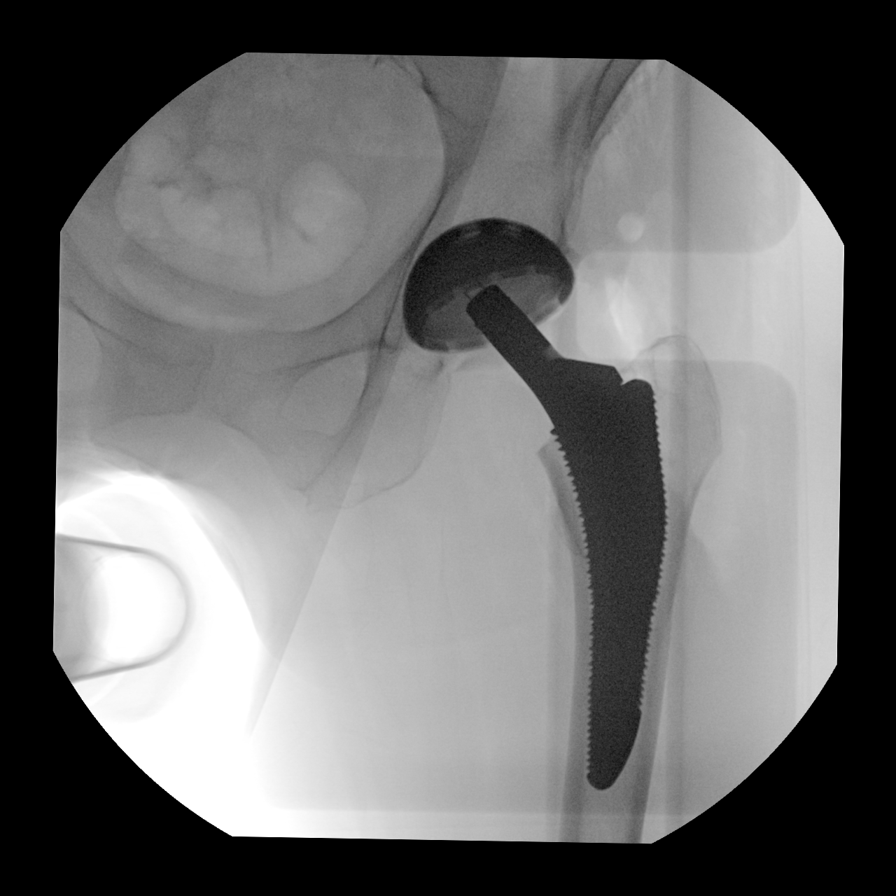
[im 5/6]
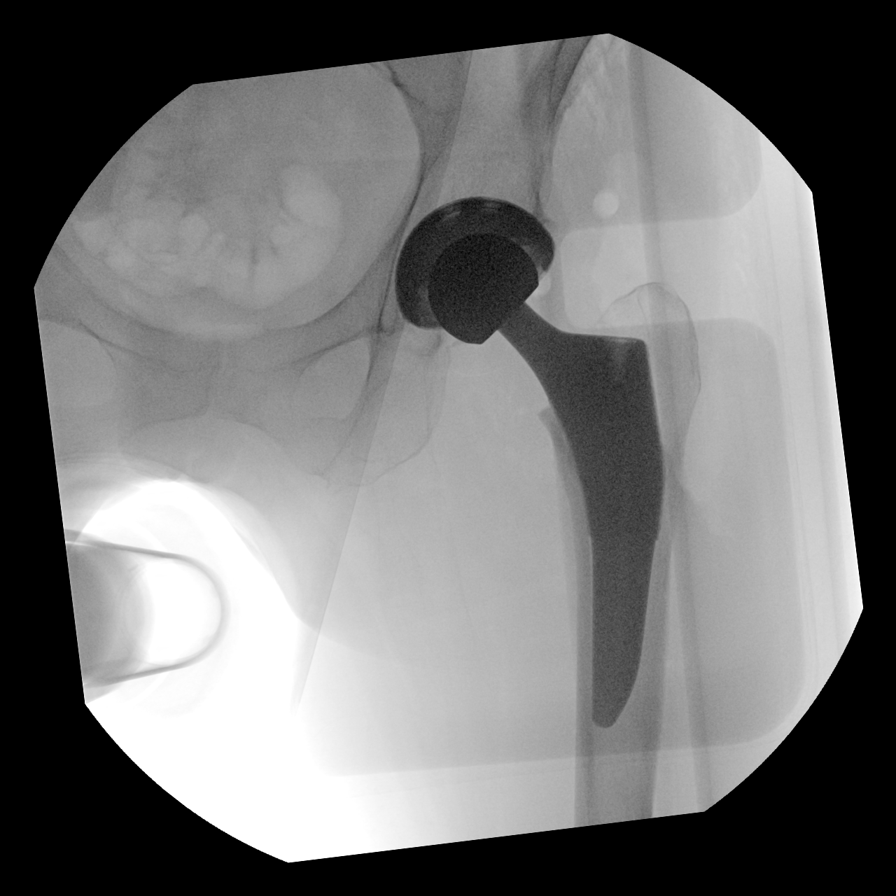
[im 6/6]
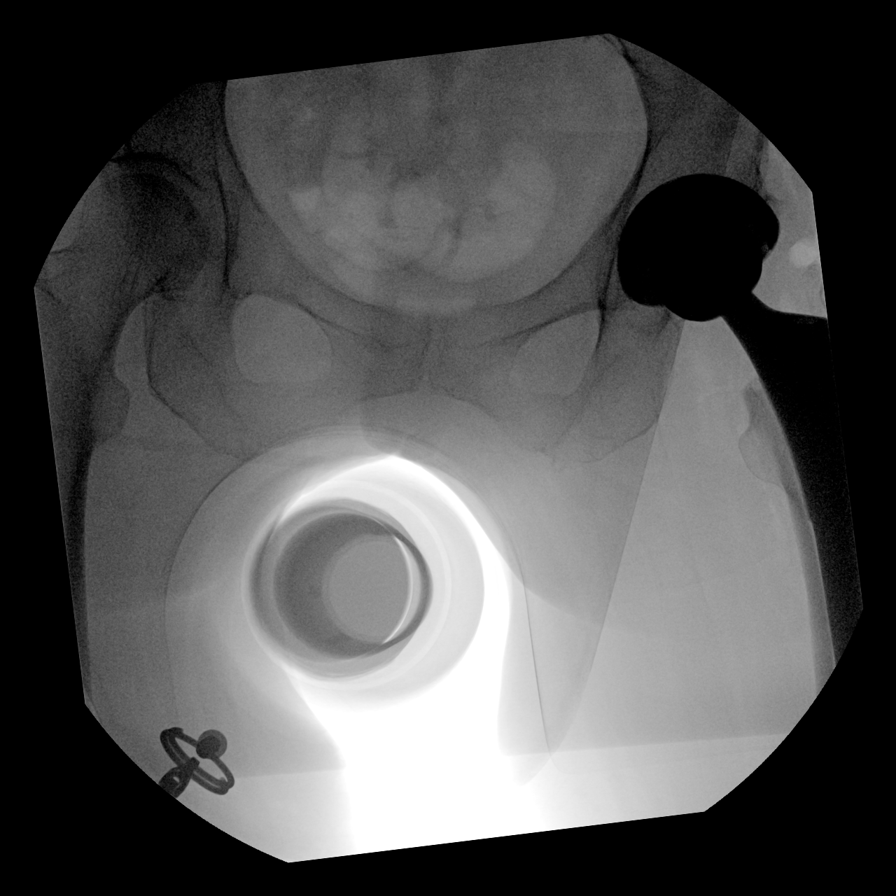

[6 of 6 positions shown; findings below may reference images not displayed]

FINDINGS: Frontal views obtained. There is a total hip replacement on the left
with prosthetic components well-seated on frontal view. No fracture
or dislocation. Right hip joint appears normal.
IMPRESSION: Total hip replacement on the left with prosthetic components well
seated. No fracture or dislocation. Normal appearing right hip joint
frontal view.

## 2021-06-24 ENCOUNTER — Ambulatory Visit (INDEPENDENT_AMBULATORY_CARE_PROVIDER_SITE_OTHER): Payer: Medicare Other

## 2021-06-24 DIAGNOSIS — I469 Cardiac arrest, cause unspecified: Secondary | ICD-10-CM | POA: Diagnosis not present

## 2021-06-24 LAB — CUP PACEART REMOTE DEVICE CHECK
Battery Remaining Longevity: 107 mo
Battery Remaining Percentage: 87 %
Battery Voltage: 2.99 V
Brady Statistic RV Percent Paced: 1 %
Date Time Interrogation Session: 20220906063630
HighPow Impedance: 79 Ohm
Implantable Lead Implant Date: 20210603
Implantable Lead Location: 753860
Implantable Pulse Generator Implant Date: 20210603
Lead Channel Impedance Value: 460 Ohm
Lead Channel Pacing Threshold Amplitude: 0.75 V
Lead Channel Pacing Threshold Pulse Width: 0.5 ms
Lead Channel Sensing Intrinsic Amplitude: 12 mV
Lead Channel Setting Pacing Amplitude: 2.5 V
Lead Channel Setting Pacing Pulse Width: 0.5 ms
Lead Channel Setting Sensing Sensitivity: 0.5 mV
Pulse Gen Serial Number: 111007674

## 2021-07-03 NOTE — Progress Notes (Signed)
Remote ICD transmission.   

## 2021-07-28 ENCOUNTER — Other Ambulatory Visit: Payer: Medicare Other

## 2021-07-28 ENCOUNTER — Other Ambulatory Visit: Payer: Self-pay | Admitting: Family Medicine

## 2021-07-28 ENCOUNTER — Other Ambulatory Visit: Payer: Self-pay

## 2021-07-28 DIAGNOSIS — Z79899 Other long term (current) drug therapy: Secondary | ICD-10-CM

## 2021-07-28 DIAGNOSIS — Z13220 Encounter for screening for lipoid disorders: Secondary | ICD-10-CM

## 2021-07-28 DIAGNOSIS — Z1329 Encounter for screening for other suspected endocrine disorder: Secondary | ICD-10-CM

## 2021-07-28 DIAGNOSIS — M816 Localized osteoporosis [Lequesne]: Secondary | ICD-10-CM

## 2021-07-28 DIAGNOSIS — Z0000 Encounter for general adult medical examination without abnormal findings: Secondary | ICD-10-CM

## 2021-07-28 NOTE — Progress Notes (Signed)
Allison Cannon is a 65 y.o. female who presents for annual wellness visit and follow-up on chronic medical conditions.  She has the following concerns:  She will call and schedule her DEXA along with her mammogram this year.  She has not had a pap smear in more than 6 years.    Immunization History  Administered Date(s) Administered   Influenza-Unspecified 06/27/2020, 07/25/2021   Last Pap smear: > 6 years ago Last mammogram: 09/30/2020 Last colonoscopy: 9 years ago at [facility]. Clear  Last DEXA: 5.5 years ago Dentist: once a year Ophtho: every 2 years  Exercise: Active with walking light weights and bicycling  Other doctors caring for patient include: Dr. [name]- cardiologist  Dermatology specialists- Amy Percell Miller  Orthopedist: Dr. Rod Can   Depression screen:  See questionnaire below.  Depression screen [facility] 2/9 07/26/2020 06/28/2020 06/10/2016  Decreased Interest 0 0 0  Down, Depressed, Hopeless 0 0 0  PHQ - 2 Score 0 0 0    Fall Risk Screen: see questionnaire below. Fall Risk  07/29/2021 07/26/2020 06/28/2020 06/10/2016  Falls in the past year? 0 1 0 No  Number falls in past yr: 0 1 - -  Injury with Fall? 0 1 - -  Risk for fall due to : No Fall Risks - - -  Follow up Falls evaluation completed - Falls evaluation completed -    ADL screen:  See questionnaire below Functional Status Survey: Is the patient deaf or have difficulty hearing?: No Does the patient have difficulty seeing, even when wearing glasses/contacts?: No Does the patient have difficulty concentrating, remembering, or making decisions?: No Does the patient have difficulty walking or climbing stairs?: No Does the patient have difficulty dressing or bathing?: No Does the patient have difficulty doing errands alone such as visiting a doctor's office or shopping?: No   End of Life Discussion:  Patient has a living will and medical power of attorney. MOST form reviewed.   Review of Systems Constitutional:  -fever, -chills, -sweats, -unexpected weight change, -anorexia, -fatigue Allergy: -sneezing, -itching, -congestion Dermatology: denies changing moles, rash, lumps, new worrisome lesions ENT: -runny nose, -ear pain, -sore throat, -hoarseness, -sinus pain, -teeth pain, -tinnitus, -hearing loss, -epistaxis Cardiology:  -chest pain, -palpitations, -edema, -orthopnea, -paroxysmal nocturnal dyspnea Respiratory: -cough, -shortness of breath, -dyspnea on exertion, -wheezing, -hemoptysis Gastroenterology: -abdominal pain, -nausea, -vomiting, -diarrhea, -constipation, -blood in stool, -changes in bowel movement, -dysphagia Hematology: -bleeding or bruising problems Musculoskeletal: -arthralgias, -myalgias, -joint swelling, -back pain, -neck pain, -cramping, -gait changes Ophthalmology: -vision changes, -eye redness, -itching, -discharge Urology: -dysuria, -difficulty urinating, -hematuria, -urinary frequency, -urgency, -incontinence Neurology: -headache, -weakness, -tingling, -numbness, -speech abnormality, -memory loss, -falls, -dizziness Psychology:  -depressed mood, -agitation, -sleep problems    PHYSICAL EXAM:  BP 120/74 (BP Location: Right Arm, Patient Position: Sitting)   Pulse 84   Ht 5\' 7"  (1.702 m)   Wt 135 lb 3.2 oz (61.3 kg)   SpO2 96%   BMI 21.18 kg/m   General Appearance: Alert, cooperative, no distress, appears stated age Head: Normocephalic, without obvious abnormality, atraumatic Eyes: PERRL, conjunctiva/corneas clear, EOM's intact Ears: Normal TM's and external ear canals Nose: mask on  Throat: mask on  Neck: Supple, no lymphadenopathy; thyroid: no enlargement/tenderness/nodules Back: Spine nontender, no curvature, ROM normal, no CVA tenderness Lungs: Clear to auscultation bilaterally without wheezes, rales or ronchi; respirations unlabored Chest Wall: No tenderness or deformity Heart: Regular rate and rhythm, S1 and S2 normal, no murmur, rub or gallop Breast Exam:  gyn Abdomen:  Soft, non-tender, nondistended, normoactive bowel sounds, no masses, no hepatosplenomegaly Genitalia: gyn Extremities: No clubbing, cyanosis or edema Pulses: 2+ and symmetric all extremities Skin: Skin color, texture, turgor normal, no rashes or lesions Lymph nodes: Cervical and supraclavicular nodes normal Neurologic: CNII-XII intact, normal strength, sensation and gait Psych: Normal mood, affect, hygiene and grooming.  ASSESSMENT/PLAN: Medicare annual wellness visit, subsequent -Here today for her Medicare wellness.  In her usual state of health.  Recent labs were done and reviewed in person.  Nothing concerning. No issues with memory, mood, ADLs and no falls.  Advanced directive counseling done.  Immunizations reviewed.  Localized osteoporosis without current pathological fracture -She will call and schedule her DEXA.  She is getting adequate calcium, vitamin D and doing weightbearing exercises.  She is not currently on bisphosphonate.  Estrogen deficiency  Hypomagnesemia -Magnesium level in normal range.  Continue current supplemental dose  Screening for cervical cancer - Plan: Ambulatory referral to Gynecology  Immunization counseling  -Received her flu vaccine and is scheduled for her COVID booster.    Discussed monthly self breast exams and yearly mammograms; at least 30 minutes of aerobic activity at least 5 days/week and weight-bearing exercise 2x/week; proper sunscreen use reviewed; healthy diet, including goals of calcium and vitamin D intake and alcohol recommendations (less than or equal to 1 drink/day) reviewed; regular seatbelt use; changing batteries in smoke detectors.  Immunization recommendations discussed.  Colonoscopy recommendations reviewed   Medicare Attestation I have personally reviewed: The patient's medical and social history Their use of alcohol, tobacco or illicit drugs Their current medications and supplements The patient's functional  ability including ADLs,fall risks, home safety risks, cognitive, and hearing and visual impairment Diet and physical activities Evidence for depression or mood disorders  The patient's weight, height, and BMI have been recorded in the chart.  I have made referrals, counseling, and provided education to the patient based on review of the above and I have provided the patient with a written personalized care plan for preventive services.     Harland Dingwall, PA-C   07/29/2021

## 2021-07-29 ENCOUNTER — Encounter: Payer: Self-pay | Admitting: Family Medicine

## 2021-07-29 ENCOUNTER — Ambulatory Visit (INDEPENDENT_AMBULATORY_CARE_PROVIDER_SITE_OTHER): Payer: Medicare Other | Admitting: Family Medicine

## 2021-07-29 VITALS — BP 120/74 | HR 84 | Ht 67.0 in | Wt 135.2 lb

## 2021-07-29 DIAGNOSIS — Z124 Encounter for screening for malignant neoplasm of cervix: Secondary | ICD-10-CM

## 2021-07-29 DIAGNOSIS — E2839 Other primary ovarian failure: Secondary | ICD-10-CM

## 2021-07-29 DIAGNOSIS — Z0000 Encounter for general adult medical examination without abnormal findings: Secondary | ICD-10-CM

## 2021-07-29 DIAGNOSIS — M816 Localized osteoporosis [Lequesne]: Secondary | ICD-10-CM | POA: Diagnosis not present

## 2021-07-29 DIAGNOSIS — Z7185 Encounter for immunization safety counseling: Secondary | ICD-10-CM

## 2021-07-29 LAB — COMPREHENSIVE METABOLIC PANEL
ALT: 11 IU/L (ref 0–32)
AST: 21 IU/L (ref 0–40)
Albumin/Globulin Ratio: 2.4 — ABNORMAL HIGH (ref 1.2–2.2)
Albumin: 4.5 g/dL (ref 3.8–4.8)
Alkaline Phosphatase: 126 IU/L — ABNORMAL HIGH (ref 44–121)
BUN/Creatinine Ratio: 15 (ref 12–28)
BUN: 10 mg/dL (ref 8–27)
Bilirubin Total: 0.4 mg/dL (ref 0.0–1.2)
CO2: 22 mmol/L (ref 20–29)
Calcium: 9.7 mg/dL (ref 8.7–10.3)
Chloride: 102 mmol/L (ref 96–106)
Creatinine, Ser: 0.67 mg/dL (ref 0.57–1.00)
Globulin, Total: 1.9 g/dL (ref 1.5–4.5)
Glucose: 88 mg/dL (ref 70–99)
Potassium: 4.1 mmol/L (ref 3.5–5.2)
Sodium: 138 mmol/L (ref 134–144)
Total Protein: 6.4 g/dL (ref 6.0–8.5)
eGFR: 97 mL/min/{1.73_m2} (ref >59)

## 2021-07-29 LAB — CBC WITH DIFFERENTIAL/PLATELET
Basophils Absolute: 0.1 10*3/uL (ref 0.0–0.2)
Basos: 2 %
EOS (ABSOLUTE): 0.1 10*3/uL (ref 0.0–0.4)
Eos: 2 %
Hematocrit: 40.5 % (ref 34.0–46.6)
Hemoglobin: 13.6 g/dL (ref 11.1–15.9)
Immature Grans (Abs): 0 10*3/uL (ref 0.0–0.1)
Immature Granulocytes: 0 %
Lymphocytes Absolute: 1.8 10*3/uL (ref 0.7–3.1)
Lymphs: 40 %
MCH: 29 pg (ref 26.6–33.0)
MCHC: 33.6 g/dL (ref 31.5–35.7)
MCV: 86 fL (ref 79–97)
Monocytes Absolute: 0.4 10*3/uL (ref 0.1–0.9)
Monocytes: 8 %
Neutrophils Absolute: 2.3 10*3/uL (ref 1.4–7.0)
Neutrophils: 48 %
Platelets: 223 10*3/uL (ref 150–450)
RBC: 4.69 x10E6/uL (ref 3.77–5.28)
RDW: 13.2 % (ref 11.7–15.4)
WBC: 4.6 10*3/uL (ref 3.4–10.8)

## 2021-07-29 LAB — LIPID PANEL
Chol/HDL Ratio: 1.9 ratio (ref 0.0–4.4)
Cholesterol, Total: 199 mg/dL (ref 100–199)
HDL: 107 mg/dL (ref >39)
LDL Chol Calc (NIH): 81 mg/dL (ref 0–99)
Triglycerides: 58 mg/dL (ref 0–149)
VLDL Cholesterol Cal: 11 mg/dL (ref 5–40)

## 2021-07-29 LAB — MAGNESIUM: Magnesium: 1.8 mg/dL (ref 1.6–2.3)

## 2021-07-29 LAB — T4, FREE: Free T4: 1.35 ng/dL (ref 0.82–1.77)

## 2021-07-29 LAB — VITAMIN D 25 HYDROXY (VIT D DEFICIENCY, FRACTURES): Vit D, 25-Hydroxy: 45.1 ng/mL (ref 30.0–100.0)

## 2021-07-29 LAB — TSH: TSH: 1.92 u[IU]/mL (ref 0.450–4.500)

## 2021-07-29 LAB — T3: T3, Total: 126 ng/dL (ref 71–180)

## 2021-07-29 NOTE — Patient Instructions (Addendum)
  Allison Cannon , Thank you for taking time to come for your Medicare Wellness Visit. I appreciate your ongoing commitment to your health goals. Please review the following plan we discussed and let me know if I can assist you in the future.   Some of the items listed below have been done but are not currently on file.    This is a list of the screening recommended for you and due dates:  Health Maintenance  Topic Date Due   COVID-19 Vaccine (1) Never done   Tetanus Vaccine  Never done   Zoster (Shingles) Vaccine (1 of 2) Never done   Pap Smear  Never done   Colon Cancer Screening  Never done   DEXA scan (bone density measurement)  Never done   Mammogram  09/30/2022   Flu Shot  Completed   Hepatitis C Screening: USPSTF Recommendation to screen - Ages 18-79 yo.  Completed   HIV Screening  Completed   HPV Vaccine  Aged Out

## 2021-07-31 ENCOUNTER — Ambulatory Visit: Payer: Medicare Other | Admitting: Family Medicine

## 2021-08-07 LAB — HM PAP SMEAR

## 2021-09-23 ENCOUNTER — Ambulatory Visit (INDEPENDENT_AMBULATORY_CARE_PROVIDER_SITE_OTHER): Payer: Medicare Other

## 2021-09-23 DIAGNOSIS — I469 Cardiac arrest, cause unspecified: Secondary | ICD-10-CM

## 2021-09-23 LAB — CUP PACEART REMOTE DEVICE CHECK
Battery Remaining Longevity: 104 mo
Battery Remaining Percentage: 84 %
Battery Voltage: 2.99 V
Brady Statistic RV Percent Paced: 1 %
Date Time Interrogation Session: 20221206051631
HighPow Impedance: 78 Ohm
Implantable Lead Implant Date: 20210603
Implantable Lead Location: 753860
Implantable Pulse Generator Implant Date: 20210603
Lead Channel Impedance Value: 480 Ohm
Lead Channel Pacing Threshold Amplitude: 0.75 V
Lead Channel Pacing Threshold Pulse Width: 0.5 ms
Lead Channel Sensing Intrinsic Amplitude: 12 mV
Lead Channel Setting Pacing Amplitude: 2.5 V
Lead Channel Setting Pacing Pulse Width: 0.5 ms
Lead Channel Setting Sensing Sensitivity: 0.5 mV
Pulse Gen Serial Number: 111007674

## 2021-10-02 NOTE — Progress Notes (Signed)
Remote ICD transmission.   

## 2021-12-23 ENCOUNTER — Ambulatory Visit (INDEPENDENT_AMBULATORY_CARE_PROVIDER_SITE_OTHER): Payer: Medicare Other

## 2021-12-23 DIAGNOSIS — I469 Cardiac arrest, cause unspecified: Secondary | ICD-10-CM

## 2021-12-23 LAB — CUP PACEART REMOTE DEVICE CHECK
Battery Remaining Longevity: 101 mo
Battery Remaining Percentage: 83 %
Battery Voltage: 2.99 V
Brady Statistic RV Percent Paced: 1 %
Date Time Interrogation Session: 20230307133246
HighPow Impedance: 71 Ohm
Implantable Lead Implant Date: 20210603
Implantable Lead Location: 753860
Implantable Pulse Generator Implant Date: 20210603
Lead Channel Impedance Value: 410 Ohm
Lead Channel Pacing Threshold Amplitude: 0.75 V
Lead Channel Pacing Threshold Pulse Width: 0.5 ms
Lead Channel Sensing Intrinsic Amplitude: 12 mV
Lead Channel Setting Pacing Amplitude: 2.5 V
Lead Channel Setting Pacing Pulse Width: 0.5 ms
Lead Channel Setting Sensing Sensitivity: 0.5 mV
Pulse Gen Serial Number: 111007674

## 2022-01-05 NOTE — Progress Notes (Signed)
Remote ICD transmission.   

## 2022-03-24 ENCOUNTER — Ambulatory Visit (INDEPENDENT_AMBULATORY_CARE_PROVIDER_SITE_OTHER): Payer: Medicare Other

## 2022-03-24 DIAGNOSIS — I469 Cardiac arrest, cause unspecified: Secondary | ICD-10-CM

## 2022-03-24 LAB — CUP PACEART REMOTE DEVICE CHECK
Battery Remaining Longevity: 99 mo
Battery Remaining Percentage: 81 %
Battery Voltage: 2.99 V
Brady Statistic RV Percent Paced: 1 %
Date Time Interrogation Session: 20230606030448
HighPow Impedance: 80 Ohm
Implantable Lead Implant Date: 20210603
Implantable Lead Location: 753860
Implantable Pulse Generator Implant Date: 20210603
Lead Channel Impedance Value: 480 Ohm
Lead Channel Pacing Threshold Amplitude: 0.75 V
Lead Channel Pacing Threshold Pulse Width: 0.5 ms
Lead Channel Sensing Intrinsic Amplitude: 12 mV
Lead Channel Setting Pacing Amplitude: 2.5 V
Lead Channel Setting Pacing Pulse Width: 0.5 ms
Lead Channel Setting Sensing Sensitivity: 0.5 mV
Pulse Gen Serial Number: 111007674

## 2022-04-01 ENCOUNTER — Ambulatory Visit (INDEPENDENT_AMBULATORY_CARE_PROVIDER_SITE_OTHER): Payer: Medicare Other | Admitting: Internal Medicine

## 2022-04-01 ENCOUNTER — Encounter: Payer: Self-pay | Admitting: Internal Medicine

## 2022-04-01 VITALS — BP 100/70 | HR 74 | Ht 67.0 in | Wt 139.4 lb

## 2022-04-01 DIAGNOSIS — Z95810 Presence of automatic (implantable) cardiac defibrillator: Secondary | ICD-10-CM | POA: Diagnosis not present

## 2022-04-01 DIAGNOSIS — I469 Cardiac arrest, cause unspecified: Secondary | ICD-10-CM

## 2022-04-01 NOTE — Patient Instructions (Signed)
Medication Instructions:  Your physician recommends that you continue on your current medications as directed. Please refer to the Current Medication list given to you today.  Labwork: None ordered.  Testing/Procedures: None ordered.  Follow-Up: Your physician wants you to follow-up in: one year with Cristopher Peru, MD or one of the following Advanced Practice Providers on your designated Care Team:   Tommye Standard, [place] Legrand Como "Jonni Sanger" Chalmers Cater, [place]  Remote monitoring is used to monitor your ICD from home. This monitoring reduces the number of office visits required to check your device to one time per year. It allows [place] to keep an eye on the functioning of your device to ensure it is working properly. You are scheduled for a device check from home on 06/23/2022. You may send your transmission at any time that day. If you have a wireless device, the transmission will be sent automatically. After your physician reviews your transmission, you will receive a postcard with your next transmission date.  Any Other Special Instructions Will Be Listed Below (If Applicable).  If you need a refill on your cardiac medications before your next appointment, please call your pharmacy.   Important Information About Sugar

## 2022-04-01 NOTE — Progress Notes (Addendum)
HPI Allison Cannon returns today for followup. She is a pleasant 66 yo woman with a h/o VF arrest s/p ICD insertion. She has done well in the interim, with no chest pain or sob. No syncope. No edema. She is exercising with no limit.The patient does note that she has been told she snores and she is concerned about sleep apnea. No Known Allergies   Current Outpatient Medications  Medication Sig Dispense Refill   acetaminophen (TYLENOL) 325 MG tablet Take 1-2 tablets (325-650 mg total) by mouth every 6 (six) hours as needed for mild pain (pain score 1-3 or temp > 100.5). 100 tablet 0   amoxicillin (AMOXIL) 500 MG tablet amoxicillin 500 mg tablet  Take 4 tablet(s) 1-2 hours prior to dental work by oral route.     Cholecalciferol (VITAMIN D) 50 MCG (2000 UT) tablet Take 2,000 Units by mouth daily. Per patient taking 1,000 units daily     diphenhydrAMINE (BENADRYL) 25 MG tablet Take 25 mg by mouth daily as needed for allergies.     Magnesium 250 MG TABS Take 250 mg by mouth 2 (two) times daily. Per patient taking 120 mg twice a day     docusate sodium (COLACE) 100 MG capsule Take 1 capsule (100 mg total) by mouth 2 (two) times daily. (Patient not taking: Reported on 04/01/2022) 120 capsule 0   ibuprofen (ADVIL) 200 MG tablet Take 200 mg by mouth every 6 (six) hours as needed for headache or mild pain. (Patient not taking: Reported on 04/01/2022)     No current facility-administered medications for this visit.     Past Medical History:  Diagnosis Date   AICD (automatic cardioverter/defibrillator) present    Arthritis of left hip    Myocardial infarction ([place])    PONV (postoperative nausea and vomiting)     ROS:   All systems reviewed and negative except as noted in the HPI.   Past Surgical History:  Procedure Laterality Date   CHOLECYSTECTOMY     ICD IMPLANT N/A 03/21/2020   Procedure: ICD IMPLANT;  Surgeon: Evans Lance, MD;  Location: [facility];  Service:  Cardiovascular;  Laterality: N/A;   KNEE ARTHROSCOPY     x 2   LEFT HEART CATH AND CORONARY ANGIOGRAPHY N/A 03/21/2020   Procedure: LEFT HEART CATH AND CORONARY ANGIOGRAPHY;  Surgeon: Troy Sine, MD;  Location: [facility];  Service: Cardiovascular;  Laterality: N/A;   SP CHOLECYSTOMY     TOTAL HIP ARTHROPLASTY Left 01/30/2021   Procedure: TOTAL HIP ARTHROPLASTY ANTERIOR APPROACH;  Surgeon: Rod Can, MD;  Location: [facility];  Service: Orthopedics;  Laterality: Left;     Family History  Problem Relation Age of Onset   Stroke Mother    Breast cancer Mother 67   Stroke Father    Cancer Brother    Colon cancer Sister 22     Social History   Socioeconomic History   Marital status: Married    Spouse name: Not on file   Number of children: 0   Years of education: Not on file   Highest education level: Not on file  Occupational History   Not on file  Tobacco Use   Smoking status: Never   Smokeless tobacco: Never  Vaping Use   Vaping Use: Never used  Substance and Sexual Activity   Alcohol use: Yes    Alcohol/week: 1.0 - 5.0 standard drink of alcohol    Types: 1 - 5  Glasses of wine per week    Comment: glass daily    Drug use: Never   Sexual activity: Not Currently  Other Topics Concern   Not on file  Social History Narrative   ** Merged History Encounter **       Social Determinants of Health   Financial Resource Strain: Not on file  Food Insecurity: Not on file  Transportation Needs: Not on file  Physical Activity: Not on file  Stress: Not on file  Social Connections: Not on file  Intimate Partner Violence: Not on file     BP 100/70   Pulse 74   Ht '5\' 7"'$  (1.702 m)   Wt 139 lb 6.4 oz (63.2 kg)   SpO2 95%   BMI 21.83 kg/m   Physical Exam:  Well appearing NAD HEENT: Unremarkable Neck:  No JVD, no thyromegally Lymphatics:  No adenopathy Back:  No CVA tenderness Lungs:  Clear with no wheezes HEART:  Regular rate rhythm, no murmurs, no  rubs, no clicks Abd:  soft, positive bowel sounds, no organomegally, no rebound, no guarding Ext:  2 plus pulses, no edema, no cyanosis, no clubbing Skin:  No rashes no nodules Neuro:  CN II through XII intact, motor grossly intact  EKG - nsr  DEVICE  Normal device function.  See PaceArt for details.   Assess/Plan:  1. VF - she has not had any additional episodes over the past year months. The etiology remains unclear. 2. ICD - her [employer]. Jude single chamber ICD is working normally. We will recheck in several months. 3. NSVT - she has had a couple of episodes. She is asymptomatic.  4. Possible sleep apnea - the patient will need to undergo outpatient sleep eval with her h/o snoring and somnolence.   Allison Overlie Ireene Ballowe,MD

## 2022-04-03 ENCOUNTER — Telehealth: Payer: Self-pay | Admitting: Internal Medicine

## 2022-04-03 DIAGNOSIS — I469 Cardiac arrest, cause unspecified: Secondary | ICD-10-CM

## 2022-04-03 DIAGNOSIS — R0683 Snoring: Secondary | ICD-10-CM

## 2022-04-03 NOTE — Telephone Encounter (Signed)
Pt states she was seen my doctor [name] on 06/14, but she has follow-up questions in regards to getting a possible sleep study done. She would like to know if this is an option for her, because she believes this could have been a possible reason for her cardiac arrest.

## 2022-04-07 ENCOUNTER — Telehealth: Payer: Self-pay | Admitting: Internal Medicine

## 2022-04-07 NOTE — Telephone Encounter (Signed)
Patient called because she thinks she might have sleep apnea. Her husband told her she snores and sometimes gasping for air when she sleeps.  She would like to be referred to a sleep provider.

## 2022-04-08 NOTE — Telephone Encounter (Signed)
Duplicate communication.

## 2022-04-08 NOTE — Progress Notes (Signed)
Remote ICD transmission.   

## 2022-04-08 NOTE — Telephone Encounter (Signed)
Discussed with Dr. [name].  Dr. [name] agrees with work up for sleep apnea.  Advised Pt that referral had been placed to [facility] pulmonology for work up.  Pt thanked nurse for follow up.

## 2022-04-12 NOTE — Progress Notes (Signed)
04/14/22- 52 yoF for sleep evaluation with concern of snoring courtesy of Crissie Sickles, MD/ Cardiology Medical problem list includes CAD/ MI/ Cardiac Arrest/ AICD,  Epworth score-5 Body weight today-139 lbs Covid vax 5 Phizer -----Patient states that she snores, gasps, dry mouth and headaches. Is not sure if she stops breathing.  ENT surgery+tonsils. No hx lung problem. Cardiac arrest. Father snored. No sleep med. 2 cups morning coffee.  Prior to Admission medications   Medication Sig Start Date End Date Taking? Authorizing Provider  acetaminophen (TYLENOL) 325 MG tablet Take 1-2 tablets (325-650 mg total) by mouth every 6 (six) hours as needed for mild pain (pain score 1-3 or temp > 100.5). 01/31/21  Yes Dorothyann Peng, PA-C  amoxicillin (AMOXIL) 500 MG tablet amoxicillin 500 mg tablet  Take 4 tablet(s) 1-2 hours prior to dental work by oral route.   Yes [provider]  Cholecalciferol (VITAMIN D) 50 MCG (2000 UT) tablet Take 2,000 Units by mouth daily. Per patient taking 1,000 units daily   Yes [provider]  diphenhydrAMINE (BENADRYL) 25 MG tablet Take 25 mg by mouth daily as needed for allergies.   Yes [provider]  Magnesium 250 MG TABS Take 250 mg by mouth 2 (two) times daily. Per patient taking 120 mg twice a day   Yes [provider]   Past Medical History:  Diagnosis Date   AICD (automatic cardioverter/defibrillator) present    Arthritis of left hip    Myocardial infarction ([place])    PONV (postoperative nausea and vomiting)    Past Surgical History:  Procedure Laterality Date   CHOLECYSTECTOMY     ICD IMPLANT N/A 03/21/2020   Procedure: ICD IMPLANT;  Surgeon: Evans Lance, MD;  Location: [facility];  Service: Cardiovascular;  Laterality: N/A;   KNEE ARTHROSCOPY     x 2   LEFT HEART CATH AND CORONARY ANGIOGRAPHY N/A 03/21/2020   Procedure: LEFT HEART CATH AND CORONARY ANGIOGRAPHY;  Surgeon: Troy Sine, MD;  Location: [facility];  Service: Cardiovascular;  Laterality: N/A;   SP CHOLECYSTOMY     TOTAL HIP ARTHROPLASTY Left 01/30/2021   Procedure: TOTAL HIP ARTHROPLASTY ANTERIOR APPROACH;  Surgeon: Rod Can, MD;  Location: [facility];  Service: Orthopedics;  Laterality: Left;   Family History  Problem Relation Age of Onset   Stroke Mother    Breast cancer Mother 43   Stroke Father    Cancer Brother    Colon cancer Sister 76   Social History   Socioeconomic History   Marital status: Married    Spouse name: Not on file   Number of children: 0   Years of education: Not on file   Highest education level: Not on file  Occupational History   Not on file  Tobacco Use   Smoking status: Never   Smokeless tobacco: Never  Vaping Use   Vaping Use: Never used  Substance and Sexual Activity   Alcohol use: Yes    Alcohol/week: 1.0 - 5.0 standard drink of alcohol    Types: 1 - 5 Glasses of wine per week    Comment: glass daily    Drug use: Never   Sexual activity: Not Currently  Other Topics Concern   Not on file  Social History Narrative   ** Merged History Encounter **       Social Determinants of Health   Financial Resource Strain: Not on file  Food Insecurity: Not on file  Transportation  Needs: Not on file  Physical Activity: Not on file  Stress: Not on file  Social Connections: Not on file  Intimate Partner Violence: Not on file   ROS-see HPI   + = positive Constitutional:    weight loss, night sweats, fevers, chills, fatigue, lassitude. HEENT:    +headaches, difficulty swallowing, tooth/dental problems, sore throat,       sneezing, itching, +ear ache, +nasal congestion, post nasal drip, snoring CV:    chest pain, orthopnea, PND, swelling in lower extremities, anasarca,                                  dizziness, palpitations Resp:   +shortness of breath with exertion or at rest.                productive cough,   non-productive cough, coughing up of blood.              change  in color of mucus.  wheezing.   Skin:    rash or lesions. GI:  No-   heartburn, indigestion, abdominal pain, nausea, vomiting, diarrhea,                 change in bowel habits, loss of appetite GU: dysuria, change in color of urine, no urgency or frequency.   flank pain. MS:   joint pain, stiffness, decreased range of motion, back pain. Neuro-     nothing unusual Psych:  change in mood or affect.  depression or anxiety.   memory loss.   OBJ- Physical Exam General- Alert, Oriented, Affect-appropriate, Distress- none acute, + slender Skin- rash-none, lesions- none, excoriation- none Lymphadenopathy- none Head- atraumatic            Eyes- Gross vision intact, PERRLA, conjunctivae and secretions clear            Ears- Hearing, canals-normal            Nose- Clear, no-Septal dev, mucus, polyps, erosion, perforation             Throat- Mallampati III-IV , mucosa clear , drainage- none, tonsils-absent, + teeth Neck- flexible , trachea midline, no stridor , thyroid nl, carotid no bruit Chest - symmetrical excursion , unlabored           Heart/CV- RRR , no murmur , no gallop  , no rub, nl s1 s2                           - JVD- none , edema- none, stasis changes- none, varices- none           Lung- clear to P&A, wheeze- none, cough- none , dullness-none, rub- none           Chest wall- +AICD Abd-  Br/ Gen/ Rectal- Not done, not indicated Extrem- cyanosis- none, clubbing, none, atrophy- none, strength- nl Neuro- grossly intact to observation

## 2022-04-14 ENCOUNTER — Encounter: Payer: Self-pay | Admitting: Internal Medicine

## 2022-04-14 ENCOUNTER — Ambulatory Visit (INDEPENDENT_AMBULATORY_CARE_PROVIDER_SITE_OTHER): Payer: Medicare Other | Admitting: Internal Medicine

## 2022-04-14 VITALS — BP 110/70 | HR 85 | Temp 98.4°F | Ht 67.0 in | Wt 139.2 lb

## 2022-04-14 DIAGNOSIS — R0683 Snoring: Secondary | ICD-10-CM | POA: Diagnosis not present

## 2022-04-14 DIAGNOSIS — Z95810 Presence of automatic (implantable) cardiac defibrillator: Secondary | ICD-10-CM | POA: Diagnosis not present

## 2022-04-23 ENCOUNTER — Telehealth: Payer: Self-pay

## 2022-04-23 NOTE — Telephone Encounter (Signed)
The patient blutooth is not connecting to her app. I put her on the device clinic schedule and let Bryan from [facility] know so he can make sure a rep is here for the appointment.

## 2022-05-07 ENCOUNTER — Ambulatory Visit: Payer: Medicare Other

## 2022-05-07 DIAGNOSIS — I469 Cardiac arrest, cause unspecified: Secondary | ICD-10-CM

## 2022-05-07 NOTE — Progress Notes (Signed)
Bluetooth issues with patient app resolved by industry in order to receive transmissions from patients ICD

## 2022-05-21 DIAGNOSIS — R0683 Snoring: Secondary | ICD-10-CM | POA: Insufficient documentation

## 2022-05-21 DIAGNOSIS — G4733 Obstructive sleep apnea (adult) (pediatric): Secondary | ICD-10-CM | POA: Insufficient documentation

## 2022-05-21 NOTE — Assessment & Plan Note (Signed)
At risk for OSA by description, although she is not heavy. Appropriate discussion done. Plan- schedule sleep study.

## 2022-05-21 NOTE — Assessment & Plan Note (Signed)
Followed by Cardiology with hx of cardiac arrest

## 2022-06-04 ENCOUNTER — Encounter: Payer: Self-pay | Admitting: Internal Medicine

## 2022-06-09 ENCOUNTER — Ambulatory Visit: Payer: Medicare Other

## 2022-06-09 DIAGNOSIS — G4733 Obstructive sleep apnea (adult) (pediatric): Secondary | ICD-10-CM | POA: Diagnosis not present

## 2022-06-09 DIAGNOSIS — R0683 Snoring: Secondary | ICD-10-CM

## 2022-06-19 DIAGNOSIS — G4733 Obstructive sleep apnea (adult) (pediatric): Secondary | ICD-10-CM | POA: Diagnosis not present

## 2022-06-23 ENCOUNTER — Ambulatory Visit (INDEPENDENT_AMBULATORY_CARE_PROVIDER_SITE_OTHER): Payer: Medicare Other

## 2022-06-23 DIAGNOSIS — I469 Cardiac arrest, cause unspecified: Secondary | ICD-10-CM

## 2022-06-24 LAB — CUP PACEART REMOTE DEVICE CHECK
Battery Remaining Longevity: 96 mo
Battery Remaining Percentage: 79 %
Battery Voltage: 2.99 V
Brady Statistic RV Percent Paced: 1 %
Date Time Interrogation Session: 20230906090716
HighPow Impedance: 78 Ohm
Implantable Lead Implant Date: 20210603
Implantable Lead Location: 753860
Implantable Pulse Generator Implant Date: 20210603
Lead Channel Impedance Value: 440 Ohm
Lead Channel Pacing Threshold Amplitude: 0.75 V
Lead Channel Pacing Threshold Pulse Width: 0.5 ms
Lead Channel Sensing Intrinsic Amplitude: 12 mV
Lead Channel Setting Pacing Amplitude: 2.5 V
Lead Channel Setting Pacing Pulse Width: 0.5 ms
Lead Channel Setting Sensing Sensitivity: 0.5 mV
Pulse Gen Serial Number: 111007674

## 2022-07-09 ENCOUNTER — Telehealth: Payer: Self-pay | Admitting: Family Medicine

## 2022-07-09 ENCOUNTER — Telehealth: Payer: Self-pay | Admitting: Medical

## 2022-07-09 NOTE — Telephone Encounter (Signed)
Pt comes in for a wellness visit 09/14/22 and wants to get her blood work done 09/08/22, will this be ok?

## 2022-07-09 NOTE — Telephone Encounter (Signed)
Left message for patient to call back and schedule Medicare Annual Wellness Visit (AWV) either virtually or in office. I left my number for patient to call [phone].  Last AWV ;07/30/21  please schedule at anytime with health coach

## 2022-07-10 NOTE — Telephone Encounter (Signed)
Routine labs, whatever is needed for her Wellness visit.

## 2022-07-17 NOTE — Progress Notes (Signed)
Remote ICD transmission.   

## 2022-07-19 NOTE — Progress Notes (Unsigned)
04/14/22- 62 yoF for sleep evaluation with concern of snoring courtesy of Crissie Sickles, MD/ Cardiology Medical problem list includes CAD/ MI/ Cardiac Arrest/ AICD,  Epworth score-5 Body weight today-139 lbs Covid vax 5 Phizer -----Patient states that she snores, gasps, dry mouth and headaches. Is not sure if she stops breathing.  ENT surgery+tonsils. No hx lung problem. Cardiac arrest. Father snored. No sleep med. 2 cups morning coffee.  07/21/22- 65 yoF followed for OSA, complicated by CAD/ MI/ Cardiac Arrest/ AICD, Osteoarthritis,  HST- 06/09/22- AHI 18.3/ hr, body weight 139 lbs.  Oxygen saturation didn't record. Needs treatment decision Body weight today-138 lbs Covid vax-3 Phizer Flu vax-had Study results reviewed, especially in the context of her coronary disease.  We reviewed treatment options carefully and she asked thoughtful questions.  She would prefer to try a fitted oral appliance first. We will watch indication to check overnight oximetry once she has her appliance.  ROS-see HPI   + = positive Constitutional:    weight loss, night sweats, fevers, chills, fatigue, lassitude. HEENT:    +headaches, difficulty swallowing, tooth/dental problems, sore throat,       sneezing, itching, +ear ache, +nasal congestion, post nasal drip, snoring CV:    chest pain, orthopnea, PND, swelling in lower extremities, anasarca,                                   dizziness, palpitations Resp:   +shortness of breath with exertion or at rest.                productive cough,   non-productive cough, coughing up of blood.              change in color of mucus.  wheezing.   Skin:    rash or lesions. GI:  No-   heartburn, indigestion, abdominal pain, nausea, vomiting, diarrhea,                 change in bowel habits, loss of appetite GU: dysuria, change in color of urine, no urgency or frequency.   flank pain. MS:   joint pain, stiffness, decreased range of motion, back pain. Neuro-     nothing  unusual Psych:  change in mood or affect.  depression or anxiety.   memory loss.   OBJ- Physical Exam General- Alert, Oriented, Affect-appropriate, Distress- none acute, + slender Skin- rash-none, lesions- none, excoriation- none Lymphadenopathy- none Head- atraumatic            Eyes- Gross vision intact, PERRLA, conjunctivae and secretions clear            Ears- Hearing, canals-normal            Nose- Clear, no-Septal dev, mucus, polyps, erosion, perforation             Throat- Mallampati III-IV , mucosa clear , drainage- none, tonsils-absent, + teeth Neck- flexible , trachea midline, no stridor , thyroid nl, carotid no bruit Chest - symmetrical excursion , unlabored           Heart/CV- RRR , no murmur , no gallop  , no rub, nl s1 s2                           - JVD- none , edema- none, stasis changes- none, varices- none           Lung- clear to  P&A, wheeze- none, cough- none , dullness-none, rub- none           Chest wall- +AICD Abd-  Br/ Gen/ Rectal- Not done, not indicated Extrem- cyanosis- none, clubbing, none, atrophy- none, strength- nl Neuro- grossly intact to observation

## 2022-07-21 ENCOUNTER — Encounter: Payer: Self-pay | Admitting: Internal Medicine

## 2022-07-21 ENCOUNTER — Ambulatory Visit (INDEPENDENT_AMBULATORY_CARE_PROVIDER_SITE_OTHER): Payer: Medicare Other | Admitting: Internal Medicine

## 2022-07-21 VITALS — BP 114/70 | HR 79 | Ht 67.0 in | Wt 138.6 lb

## 2022-07-21 DIAGNOSIS — G4733 Obstructive sleep apnea (adult) (pediatric): Secondary | ICD-10-CM | POA: Diagnosis not present

## 2022-07-21 DIAGNOSIS — I469 Cardiac arrest, cause unspecified: Secondary | ICD-10-CM

## 2022-07-21 NOTE — Patient Instructions (Signed)
Order- referral to Dr Augustina Mood, DDS   consider oral appliance for OSA  Please call if we can help

## 2022-07-22 ENCOUNTER — Encounter: Payer: Self-pay | Admitting: Gastroenterology

## 2022-07-22 ENCOUNTER — Encounter: Payer: Self-pay | Admitting: Internal Medicine

## 2022-07-22 NOTE — Assessment & Plan Note (Signed)
No new events reported.  She continues to follow with cardiology.

## 2022-07-22 NOTE — Assessment & Plan Note (Signed)
He wants to try a fitted oral appliance first.  We may need to check overnight oximetry once this is completed. Plan-referral to Dr. Toy Cookey to consider oral appliance for obstructive sleep apnea

## 2022-08-17 ENCOUNTER — Ambulatory Visit: Payer: Medicare Other | Admitting: Internal Medicine

## 2022-08-17 NOTE — Progress Notes (Signed)
Allison Cannon is a 66 y.o. female who presents for annual wellness visit and follow-up on chronic medical conditions.  She has the following concerns:  Recent diagnosis of moderate OSA by Dr. Maple Hudson per sleep test. States she is going to try a dental appliance.   Right thigh with a small lump, tender at times. Is not changing.   RSV, flu and Covid booster.  [facility] at [employer]  Tdap  Pneumonia vaccines.   [facility] -pap smear-  inconclusive.   Has colonoscopy scheduled with [facility] 09/21/2022.  Sister with colon cancer.       Immunization History  Administered Date(s) Administered   Influenza-Unspecified 06/27/2020, 07/25/2021, 07/15/2022   PFIZER(Purple Top)SARS-COV-2 Vaccination 01/09/2020, 01/30/2020, 09/04/2020   Pfizer Covid-19 Vaccine Bivalent Booster 52yrs & up 02/20/2021, 08/01/2021, 02/19/2022, 07/31/2022   Rsv, Bivalent, Protein Subunit Rsvpref,pf Verdis Frederickson) 08/11/2022   Zoster Recombinat (Shingrix) 08/01/2018, 10/17/2018   Last Pap smear: December 2022 Last mammogram: December 2021 Last colonoscopy: ?10 years ago in [place] Last DEXA: 2017 Dentist: Netta Corrigan Ophtho: [employer] Exercise: Exercises x5 days, walking, swimming and body pump   Other doctors caring for patient include: Cardiologist- Dr. Ladona Ridgel Dermatology specialists- Raoul Pitch  Orthopedist: Dr. Samson Frederic  Pulmonologist- Dr. Maple Hudson Gynecologist- [facility]    Depression screen:  See questionnaire below.     08/18/2022    8:13 AM 07/26/2020    2:05 PM 06/28/2020   10:57 AM 06/10/2016    1:40 PM  Depression screen PHQ 2/9  Decreased Interest 0 0 0 0  Down, Depressed, Hopeless 0 0 0 0  PHQ - 2 Score 0 0 0 0    Fall Risk Screen: see questionnaire below.    08/18/2022    8:14 AM 07/29/2021    2:29 PM 07/26/2020    2:05 PM 06/28/2020   10:57 AM 06/10/2016    1:40 PM  Fall Risk   Falls in the past year? 0 0 1 0 No  Number falls in past yr: 0 0 1    Injury with Fall? 0 0 1     Risk for fall due to : No Fall Risks No Fall Risks     Follow up Falls evaluation completed Falls evaluation completed  Falls evaluation completed     ADL screen:  See questionnaire below Functional Status Survey: Is the patient deaf or have difficulty hearing?: No Does the patient have difficulty seeing, even when wearing glasses/contacts?: No Does the patient have difficulty concentrating, remembering, or making decisions?: No Does the patient have difficulty walking or climbing stairs?: No Does the patient have difficulty dressing or bathing?: No Does the patient have difficulty doing errands alone such as visiting a doctor's office or shopping?: No   End of Life Discussion:  Patient has a living will and medical power of attorney  Review of Systems Constitutional: -fever, -chills, -sweats, -unexpected weight change, -anorexia, -fatigue Allergy: -sneezing, -itching, -congestion Dermatology: denies changing moles, rash, new worrisome lesions ENT: -runny nose, -ear pain, -sore throat, -hoarseness, -sinus pain, -teeth pain, -tinnitus, -hearing loss, -epistaxis Cardiology:  -chest pain, -palpitations, -edema, -orthopnea Respiratory: -cough, -shortness of breath, -dyspnea on exertion, -wheezing, -hemoptysis Gastroenterology: -abdominal pain, -nausea, -vomiting, -diarrhea, -constipation, -blood in stool, -changes in bowel movement, -dysphagia Hematology: -bleeding or bruising problems Musculoskeletal: -arthralgias, -myalgias, -joint swelling, -back pain, -neck pain, -cramping, -gait changes Ophthalmology: -vision changes, -eye redness, -itching, -discharge Urology: -dysuria, -difficulty urinating, -hematuria, -urinary frequency, -urgency, -incontinence Neurology: -headache, -weakness, -tingling, -numbness, -speech abnormality, -memory loss, -falls, -  dizziness Psychology:  -depressed mood, -agitation, -sleep problems    PHYSICAL EXAM:  BP 116/82 (BP Location: Left Arm, Patient  Position: Sitting, Cuff Size: Large)   Pulse 75   Temp 97.6 F (36.4 C) (Temporal)   Ht 5\' 7"  (1.702 m)   Wt 139 lb (63 kg)   SpO2 98%   BMI 21.77 kg/m   General Appearance: Alert, cooperative, no distress, appears stated age Head: Normocephalic, without obvious abnormality, atraumatic Eyes: PERRL, conjunctiva/corneas clear, EOM's intact Ears: Normal TM's and external ear canals Nose: Nares normal, mucosa normal, no drainage  Throat: Lips, mucosa, and tongue normal Neck: Supple, no lymphadenopathy; thyroid: no enlargement/tenderness/nodules Back: Spine nontender, ROM normal, no CVA tenderness Lungs: Clear to auscultation bilaterally without wheezes, rales or ronchi; respirations unlabored Chest Wall: No tenderness or deformity Heart: Regular rate and rhythm  Breast Exam: OB/GYN Abdomen: Soft, non-tender, nondistended, normoactive bowel sounds, no masses, no hepatosplenomegaly Genitalia: OB/GYN Extremities: No clubbing, cyanosis or edema. Small, soft, round and moveable mass to right upper leg, non tender.  Pulses: 2+ and symmetric all extremities Skin: Skin color, texture, turgor normal Lymph nodes: Cervical, supraclavicular, and axillary nodes normal Neurologic: CNII-XII intact, normal strength, sensation and gait Psych: Normal mood, affect, hygiene and grooming.  ASSESSMENT/PLAN: Medicare annual wellness visit, subsequent -No issues with falls, depression, memory or ADLs.  Reviewed medications and preventive health care.  Estrogen deficiency - Plan: DG Bone Density  Localized osteoporosis without current pathological fracture - Plan: DG Bone Density -She will call and schedule her bone density and we will need to discuss treatment pending results.  Elevated alkaline phosphatase level - Plan: CBC with Differential/Platelet, Comprehensive metabolic panel -Recheck CMP.  Encounter for screening mammogram for malignant neoplasm of breast - Plan: MM 3D SCREEN BREAST  BILATERAL -She will call and schedule her mammogram  OSA (obstructive sleep apnea) -Working with pulmonology on this.  May try dental appliance first.  History of sudden cardiac arrest - Plan: Lipid panel, Magnesium -Uncertain etiology.  She is doing well.  Followed by cardiology.  Magnesium deficiency - Plan: CBC with Differential/Platelet, Comprehensive metabolic panel, Magnesium -Check labs and follow-up  Immunization counseling  Screening for heart disease - Plan: Lipid panel  Mass of skin of right lower extremity -Discussed that this appears to be superficial and benign.  She will let me know if this is changing.  She will also call and schedule with a dermatologist.    Discussed monthly self breast exams and yearly mammograms; at least 30 minutes of aerobic activity at least 5 days/week and weight-bearing exercise 2x/week; proper sunscreen use reviewed; healthy diet, including goals of calcium and vitamin D intake and alcohol recommendations (less than or equal to 1 drink/day) reviewed; regular seatbelt use; changing batteries in smoke detectors.  Immunization recommendations discussed.  Colonoscopy recommendations reviewed   Medicare Attestation I have personally reviewed: The patient's medical and social history Their use of alcohol, tobacco or illicit drugs Their current medications and supplements The patient's functional ability including ADLs,fall risks, home safety risks, cognitive, and hearing and visual impairment Diet and physical activities Evidence for depression or mood disorders  The patient's weight, height, and BMI have been recorded in the chart.  I have made referrals, counseling, and provided education to the patient based on review of the above and I have provided the patient with a written personalized care plan for preventive services.     Hetty Blend, NP-C   08/19/2022

## 2022-08-18 ENCOUNTER — Ambulatory Visit (INDEPENDENT_AMBULATORY_CARE_PROVIDER_SITE_OTHER): Payer: Medicare Other | Admitting: Family Medicine

## 2022-08-18 ENCOUNTER — Encounter: Payer: Self-pay | Admitting: Family Medicine

## 2022-08-18 VITALS — BP 116/82 | HR 75 | Temp 97.6°F | Ht 67.0 in | Wt 139.0 lb

## 2022-08-18 DIAGNOSIS — Z8674 Personal history of sudden cardiac arrest: Secondary | ICD-10-CM

## 2022-08-18 DIAGNOSIS — Z1231 Encounter for screening mammogram for malignant neoplasm of breast: Secondary | ICD-10-CM

## 2022-08-18 DIAGNOSIS — Z0000 Encounter for general adult medical examination without abnormal findings: Secondary | ICD-10-CM | POA: Diagnosis not present

## 2022-08-18 DIAGNOSIS — G4733 Obstructive sleep apnea (adult) (pediatric): Secondary | ICD-10-CM | POA: Diagnosis not present

## 2022-08-18 DIAGNOSIS — M816 Localized osteoporosis [Lequesne]: Secondary | ICD-10-CM

## 2022-08-18 DIAGNOSIS — E2839 Other primary ovarian failure: Secondary | ICD-10-CM | POA: Diagnosis not present

## 2022-08-18 DIAGNOSIS — Z136 Encounter for screening for cardiovascular disorders: Secondary | ICD-10-CM | POA: Diagnosis not present

## 2022-08-18 DIAGNOSIS — Z7185 Encounter for immunization safety counseling: Secondary | ICD-10-CM

## 2022-08-18 DIAGNOSIS — R748 Abnormal levels of other serum enzymes: Secondary | ICD-10-CM

## 2022-08-18 DIAGNOSIS — E612 Magnesium deficiency: Secondary | ICD-10-CM

## 2022-08-18 DIAGNOSIS — R2241 Localized swelling, mass and lump, right lower limb: Secondary | ICD-10-CM

## 2022-08-18 LAB — COMPREHENSIVE METABOLIC PANEL
ALT: 13 U/L (ref 0–35)
AST: 15 U/L (ref 0–37)
Albumin: 4.4 g/dL (ref 3.5–5.2)
Alkaline Phosphatase: 88 U/L (ref 39–117)
BUN: 11 mg/dL (ref 6–23)
CO2: 28 mEq/L (ref 19–32)
Calcium: 9.5 mg/dL (ref 8.4–10.5)
Chloride: 103 mEq/L (ref 96–112)
Creatinine, Ser: 0.61 mg/dL (ref 0.40–1.20)
GFR: 92.76 mL/min (ref >60.00)
Glucose, Bld: 92 mg/dL (ref 70–99)
Potassium: 3.9 mEq/L (ref 3.5–5.1)
Sodium: 139 mEq/L (ref 135–145)
Total Bilirubin: 0.5 mg/dL (ref 0.2–1.2)
Total Protein: 6.7 g/dL (ref 6.0–8.3)

## 2022-08-18 LAB — CBC WITH DIFFERENTIAL/PLATELET
Basophils Absolute: 0.1 10*3/uL (ref 0.0–0.1)
Basophils Relative: 2.3 % (ref 0.0–3.0)
Eosinophils Absolute: 0.1 10*3/uL (ref 0.0–0.7)
Eosinophils Relative: 1.4 % (ref 0.0–5.0)
HCT: 39.6 % (ref 36.0–46.0)
Hemoglobin: 13.2 g/dL (ref 12.0–15.0)
Lymphocytes Relative: 36.2 % (ref 12.0–46.0)
Lymphs Abs: 1.6 10*3/uL (ref 0.7–4.0)
MCHC: 33.2 g/dL (ref 30.0–36.0)
MCV: 87.9 fl (ref 78.0–100.0)
Monocytes Absolute: 0.3 10*3/uL (ref 0.1–1.0)
Monocytes Relative: 7.6 % (ref 3.0–12.0)
Neutro Abs: 2.3 10*3/uL (ref 1.4–7.7)
Neutrophils Relative %: 52.5 % (ref 43.0–77.0)
Platelets: 198 10*3/uL (ref 150.0–400.0)
RBC: 4.51 Mil/uL (ref 3.87–5.11)
RDW: 13.2 % (ref 11.5–15.5)
WBC: 4.4 10*3/uL (ref 4.0–10.5)

## 2022-08-18 LAB — LIPID PANEL
Cholesterol: 182 mg/dL (ref 0–200)
HDL: 96.4 mg/dL (ref >39.00)
LDL Cholesterol: 73 mg/dL (ref 0–99)
NonHDL: 85.82
Total CHOL/HDL Ratio: 2
Triglycerides: 62 mg/dL (ref 0.0–149.0)
VLDL: 12.4 mg/dL (ref 0.0–40.0)

## 2022-08-18 LAB — MAGNESIUM: Magnesium: 1.8 mg/dL (ref 1.5–2.5)

## 2022-08-18 NOTE — Patient Instructions (Addendum)
It was a pleasure seeing you today.  Please go downstairs for labs before you leave.  Please call [facility] to schedule your mammogram and bone density studies.  We will request your vaccines from [facility] for you may also check on these and if you are not up-to-date with your pneumonia vaccines or Tdap (tetanus, diphtheria, and pertussis) and I recommend that you get this updated.  Please call [facility] to schedule your repeat Pap smear.  I recommend scheduling a full skin check with [facility].  Continue eating a healthy diet and getting at least 150 minutes of vigorous physical activity each week.

## 2022-08-24 ENCOUNTER — Encounter: Payer: Self-pay | Admitting: Family Medicine

## 2022-08-26 ENCOUNTER — Emergency Department (HOSPITAL_COMMUNITY)
Admission: EM | Admit: 2022-08-26 | Discharge: 2022-08-26 | Disposition: A | Discharge status: Home / Self Care | Payer: Medicare Other | Attending: Emergency Medicine | Admitting: Emergency Medicine

## 2022-08-26 ENCOUNTER — Emergency Department (HOSPITAL_COMMUNITY): Payer: Medicare Other

## 2022-08-26 ENCOUNTER — Other Ambulatory Visit: Payer: Self-pay

## 2022-08-26 ENCOUNTER — Encounter (HOSPITAL_COMMUNITY): Payer: Self-pay

## 2022-08-26 DIAGNOSIS — Y92410 Unspecified street and highway as the place of occurrence of the external cause: Secondary | ICD-10-CM | POA: Diagnosis not present

## 2022-08-26 DIAGNOSIS — R110 Nausea: Secondary | ICD-10-CM | POA: Insufficient documentation

## 2022-08-26 DIAGNOSIS — S52612A Displaced fracture of left ulna styloid process, initial encounter for closed fracture: Secondary | ICD-10-CM | POA: Diagnosis not present

## 2022-08-26 DIAGNOSIS — S6992XA Unspecified injury of left wrist, hand and finger(s), initial encounter: Secondary | ICD-10-CM | POA: Diagnosis present

## 2022-08-26 DIAGNOSIS — S52502A Unspecified fracture of the lower end of left radius, initial encounter for closed fracture: Secondary | ICD-10-CM | POA: Diagnosis not present

## 2022-08-26 MED ORDER — SODIUM CHLORIDE 0.9 % IV BOLUS
500.0000 mL | Freq: Once | INTRAVENOUS | Status: AC
  Administered 2022-08-26: 500 mL via INTRAVENOUS

## 2022-08-26 MED ORDER — OXYCODONE-ACETAMINOPHEN 5-325 MG PO TABS: 1.0000 | ORAL_TABLET | Freq: Four times a day (QID) | ORAL | 0 refills | Status: DC | PRN

## 2022-08-26 MED ORDER — ETOMIDATE 2 MG/ML IV SOLN
INTRAVENOUS | Status: DC | PRN
  Administered 2022-08-26: 2 mg via INTRAVENOUS
  Administered 2022-08-26: 6 mg via INTRAVENOUS

## 2022-08-26 MED ORDER — ONDANSETRON HCL 4 MG/2ML IJ SOLN
4.0000 mg | Freq: Once | INTRAMUSCULAR | Status: AC
  Administered 2022-08-26: 4 mg via INTRAVENOUS
  Filled 2022-08-26: qty 2

## 2022-08-26 MED ORDER — ONDANSETRON 4 MG PO TBDP: 4.0000 mg | ORAL_TABLET | Freq: Three times a day (TID) | ORAL | 0 refills | Status: DC | PRN

## 2022-08-26 MED ORDER — FENTANYL CITRATE PF 50 MCG/ML IJ SOSY
100.0000 ug | PREFILLED_SYRINGE | Freq: Once | INTRAMUSCULAR | Status: DC
  Filled 2022-08-26: qty 2

## 2022-08-26 MED ORDER — ETOMIDATE 2 MG/ML IV SOLN
8.0000 mg | Freq: Once | INTRAVENOUS | Status: DC
  Filled 2022-08-26: qty 10

## 2022-08-26 MED ORDER — ONDANSETRON HCL 4 MG/2ML IJ SOLN
4.0000 mg | Freq: Once | INTRAMUSCULAR | Status: DC
  Filled 2022-08-26: qty 2

## 2022-08-26 MED ORDER — FENTANYL CITRATE PF 50 MCG/ML IJ SOSY
100.0000 ug | PREFILLED_SYRINGE | Freq: Once | INTRAMUSCULAR | Status: AC
  Administered 2022-08-26: 100 ug via INTRAVENOUS
  Filled 2022-08-26: qty 2
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
## 2022-08-28 DIAGNOSIS — S52502A Unspecified fracture of the lower end of left radius, initial encounter for closed fracture: Secondary | ICD-10-CM | POA: Insufficient documentation

## 2022-08-31 ENCOUNTER — Encounter: Payer: Self-pay | Admitting: *Deleted

## 2022-08-31 ENCOUNTER — Telehealth: Payer: Self-pay | Admitting: *Deleted

## 2022-08-31 NOTE — Patient Outreach (Signed)
  Care Coordination TOC Note Transition Care Management Unsuccessful Follow-up Telephone Call  Date of discharge and from where:  Wednesday, 08/26/22 ED Visit [facility], (L) wrist fracture  Attempts:  1st Attempt  Reason for unsuccessful TCM follow-up call:  Left voice message  Oneta Rack, RN, BSN, CCRN Alumnus RN CM Care Coordination/ Transition of [place] Management [phone]: direct office

## 2022-09-01 ENCOUNTER — Encounter: Payer: Self-pay | Admitting: *Deleted

## 2022-09-01 ENCOUNTER — Telehealth: Payer: Self-pay | Admitting: *Deleted
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
## 2022-09-01 NOTE — ED Provider Notes (Signed)
Medical screening examination/treatment/procedure(s) were conducted as a shared visit with non-physician practitioner(s) and myself.  I personally evaluated the patient during the encounter.  Reduction of fracture  Date/Time: 09/01/2022 8:15 AM  Performed by: Margette Fast, MD Authorized by: Margette Fast, MD  Consent: Written consent obtained. Risks and benefits: risks, benefits and alternatives were discussed Consent given by: patient Required items: required blood products, implants, devices, and special equipment available Patient identity confirmed: arm band and verbally with patient Time out: Immediately prior to procedure a "time out" was called to verify the correct patient, procedure, equipment, support staff and site/side marked as required. Preparation: Patient was prepped and draped in the usual sterile fashion. Local anesthesia used: no  Anesthesia: Local anesthesia used: no  Sedation: Patient sedated: yes Sedation type: (deep) Sedatives: etomidate Analgesia: fentanyl Vitals: Vital signs were monitored during sedation.  Patient tolerance: patient tolerated the procedure well with no immediate complications Comments: After adequate sedation the injury was pulled to length and reduced. No residual skin tenting or lacerations.    .Sedation  Date/Time: 09/01/2022 8:17 AM  Performed by: Margette Fast, MD Authorized by: Margette Fast, MD   Consent:    Consent obtained:  Written   Consent given by:  Patient   Risks discussed:  Allergic reaction, dysrhythmia, inadequate sedation, nausea, prolonged hypoxia resulting in organ damage, prolonged sedation necessitating reversal, respiratory compromise necessitating ventilatory assistance and intubation and vomiting Universal protocol:    Immediately prior to procedure, a time out was called: yes     Patient identity confirmed:  Verbally with patient and arm band Indications:    Procedure performed:  Fracture  reduction   Procedure necessitating sedation performed by:  Physician performing sedation Pre-sedation assessment:    Time since last food or drink:  6 hours   ASA classification: class 2 - patient with mild systemic disease     Mallampati score:  I - soft palate, uvula, fauces, pillars visible   Neck mobility: normal     Pre-sedation assessments completed and reviewed: airway patency, cardiovascular function, hydration status, mental status, nausea/vomiting, pain level, respiratory function and temperature   Immediate pre-procedure details:    Reassessment: Patient reassessed immediately prior to procedure     Reviewed: vital signs     Verified: bag valve mask available, emergency equipment available, intubation equipment available, IV patency confirmed and oxygen available   Procedure details (see MAR for exact dosages):    Preoxygenation:  Nasal cannula   Sedation:  Etomidate   Intended level of sedation: deep   Analgesia:  Fentanyl   Intra-procedure monitoring:  Blood pressure monitoring, cardiac monitor, continuous capnometry, continuous pulse oximetry, frequent LOC assessments and frequent vital sign checks   Intra-procedure events: none     Total Provider sedation time (minutes):  30 Post-procedure details:    Attendance: Constant attendance by certified staff until patient recovered     Recovery: Patient returned to pre-procedure baseline     Post-sedation assessments completed and reviewed: airway patency, cardiovascular function, hydration status, mental status, nausea/vomiting, pain level, respiratory function and temperature     Patient is stable for discharge or admission: yes     Procedure completion:  Tolerated well, no immediate complications    Nanda Quinton, MD Emergency Medicine    Trayson Stitely, Wonda Old[facility], MD 09/01/22 [phone]

## 2022-09-01 NOTE — Patient Outreach (Signed)
  Care Coordination TOC Note Transition Care Management Unsuccessful Follow-up Telephone Call  Date of discharge and from where:  Wednesday 08/26/22 ED visit- [facility]; (L) wrist fracture; EMMI Red Alert: "No scheduled follow up"  Attempts:  2nd Attempt  Reason for unsuccessful TCM follow-up call:  Left voice message  Oneta Rack, RN, BSN, CCRN Alumnus RN CM Care Coordination/ Transition of [place] Management [phone]: direct office

## 2022-09-14 ENCOUNTER — Ambulatory Visit: Payer: Medicare Other | Admitting: Medical

## 2022-09-15 NOTE — Progress Notes (Unsigned)
Cardiology Office Note Date:  09/15/2022  Patient ID:  Allison Cannon, Allison Cannon [birth year], MRN [id number] PCP:  Allison Rm, NP-C  Cardiologist:  Dr. [name] Electrophysiologist: Dr. [name]   Chief Complaint:  *** ? RF loss  History of Present Illness: Allison Cannon is a 66 y.o. female with no known PMHx until June 2021 suffered cardiac arrest   Admitted 03/19/21 Ms. Allison Cannon was at the [facility] exercising when she was witnessed to collapse.  EMS record reviewed On their arrival FD was on scene, [t was conscious though with snoring type respirations Reportedly she had immediate CPR started and once AED applied was advised shock and she was given one shock  By AED, afterwards she became conscious though with body jerking, throwing her head back, incontinent of urine and spastic type movements.  She had Left gaze preference, displayed lip smacking, sticking tongue out. She was found in SR and maintained SR through their tracings, rates low 100's, and stable BP.   Code stroke was activated, neurology and cardiology consulted.   Neurology note from today is incomplete though appears they feel most likely the patient suffered from a syncopal episode secondary to an acute arrhythmia, followed by syncopal seizure. EEG is normal, which militates against epilepsy as the primary etiology for her presentation.   MRI brain is negative for stroke. 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  LVEF 70-75%, hyperdynamic C.MRI findings c/w hypertensive heart disease Cath with NOD Underwent ICD implant  She is pending hip surgery (LEFT TOTAL HIP ARTHROPLASTY), 01/30/21 needs clearance.  I saw her 01/21/22 She is doing well, despite her hip pain. Has been back to the gym, continues to have excellent exertional capacity No CP, palpitations No SOB No dizzy spells, near syncope or syncope. She has been taking Mag replacement had to have  the dose lowered with some GI intolerance, her mag on admit was 1.6, not likely the contributor, but with replacement 1.9, advised to continue She will need to hold for surgery apparently, that will be OK from [place]. RCRI score is zero, 0.4% No changes were made.  She saw Dr. [name] 04/01/22, doing well, no symptoms, syncope.  Mentioned snoring and interest in being tested for apnea.  Noted some NSVTs.  Planned for slseep testing  ER visit 08/26/22, fell off her bick taking a photo and broke her wrist  *** symptoms *** syncope   Device information SJM single chamber ICD implanted 03/21/2020 Secondary prevention   Past Medical History:  Diagnosis Date   AICD (automatic cardioverter/defibrillator) present    Arthritis of left hip    Myocardial infarction ([place])    PONV (postoperative nausea and vomiting)     Past Surgical History:  Procedure Laterality Date   CHOLECYSTECTOMY     ICD IMPLANT N/A 03/21/2020   Procedure: ICD IMPLANT;  Surgeon: Evans Lance, MD;  Location: [facility];  Service: Cardiovascular;  Laterality: N/A;   KNEE ARTHROSCOPY     x 2   LEFT HEART CATH AND CORONARY ANGIOGRAPHY N/A 03/21/2020   Procedure: LEFT HEART CATH AND CORONARY ANGIOGRAPHY;  Surgeon: Troy Sine, MD;  Location: [facility];  Service: Cardiovascular;  Laterality: N/A;   SP CHOLECYSTOMY     TOTAL HIP ARTHROPLASTY Left 01/30/2021   Procedure: TOTAL HIP ARTHROPLASTY ANTERIOR APPROACH;  Surgeon: Rod Can, MD;  Location: [facility];  Service: Orthopedics;  Laterality: Left;  Current Outpatient Medications  Medication Sig Dispense Refill   acetaminophen (TYLENOL) 325 MG tablet Take 1-2 tablets (325-650 mg total) by mouth every 6 (six) hours as needed for mild pain (pain score 1-3 or temp > 100.5). 100 tablet 0   Cholecalciferol (VITAMIN D) 50 MCG (2000 UT) tablet Take 2,000 Units by mouth daily. Per patient taking 1,000 units daily     Magnesium 250 MG TABS Take 250 mg by mouth 2  (two) times daily. Per patient taking 120 mg twice a day     ondansetron (ZOFRAN-ODT) 4 MG disintegrating tablet Take 1 tablet (4 mg total) by mouth every 8 (eight) hours as needed for nausea or vomiting. 6 tablet 0   oxyCODONE-acetaminophen (PERCOCET/ROXICET) 5-325 MG tablet Take 1-2 tablets by mouth every 6 (six) hours as needed for severe pain. 10 tablet 0   No current facility-administered medications for this visit.    Allergies:   Patient has no known allergies.   Social History:  The patient  reports that she has never smoked. She has never used smokeless tobacco. She reports current alcohol use of about 1.0 - 5.0 standard drink of alcohol per week. She reports that she does not use drugs.   Family History:  The patient's family history includes Breast cancer (age of onset: 66) in her mother; Cancer in her brother; Colon cancer (age of onset: 42) in her sister; Stroke in her father and mother.  ROS:  Please see the history of present illness.    All other systems are reviewed and otherwise negative.   PHYSICAL EXAM:  VS:  There were no vitals taken for this visit. BMI: There is no height or weight on file to calculate BMI. Well nourished, well developed, in no acute distress HEENT: normocephalic, atraumatic Neck: no JVD, carotid bruits or masses Cardiac:  *** RRR; no significant murmurs, no rubs, or gallops Lungs:  *** CTA b/l, no wheezing, rhonchi or rales Abd: soft, nontender MS: no deformity or atrophy Ext: *** no edema Skin: warm and dry, no rash Neuro:  No gross deficits appreciated Psych: euthymic mood, full affect  *** ICD site is stable, no tethering or discomfort   EKG:  Done today and reviewed by myself shows  ***  Device interrogation done today and reviewed by myself:  ***       03/21/2020: LHC  Normal coronary arteries with a very dominant right coronary system.   LV EDP 9 mmHg.   RECOMMENDATION: Catheterization results were reviewed with Dr. Crissie Sickles.  With normal coronary arteries, the patient will be transported to the EP lab for placement of ICD.   03/21/2020: c.MRI IMPRESSION: 1. Normal left ventricular size with moderate basal septal hypertrophy and mild hypertrophy of the remaining myocardium and hyperdynamic systolic function (LVEF = 74%). There are no regional wall motion abnormalities and no late gadolinium enhancement in the left ventricular myocardium. Extracellular volume is increased at 33% (normal < 25%).   2. Normal right ventricular size, thickness and systolic function (LVEF = 65%). There are no regional wall motion abnormalities.   3. Mildly dilated left atrium. normal right atrial size.   4. Normal size of the aortic root, ascending aorta and pulmonary artery.   5. Mild mitral regurgitation.   6. Normal pericardium.  Trivial pericardial effusion.   These findings are consistent with hypertensive heart disease. There is no evidence for arrhythmogenic right ventricular cardiomyopathy or infiltrative cardiomyopathy.     03/19/2020: TTE IMPRESSIONS  1. Left  ventricular ejection fraction, by estimation, is 70 to 75%. The  left ventricle has hyperdynamic function. The left ventricle has no  regional wall motion abnormalities. Left ventricular diastolic parameters  were normal.   2. Right ventricular systolic function is normal. The right ventricular  size is normal.   3. Left atrial size was mildly dilated.   4. Right atrial size was mildly dilated.   5. The mitral valve is normal in structure. Trivial mitral valve  regurgitation. No evidence of mitral stenosis.   6. The aortic valve is tricuspid. Aortic valve regurgitation is not  visualized. No aortic stenosis is present.   7. Aortic dilatation noted. There is borderline dilatation of the  ascending aorta measuring 37 mm.   8. The inferior vena cava is normal in size with <50% respiratory  variability, suggesting right atrial pressure of 8 mmHg.     Recent Labs: 08/18/2022: ALT 13; BUN 11; Creatinine, Ser 0.61; Hemoglobin 13.2; Magnesium 1.8; Platelets 198.0; Potassium 3.9; Sodium 139  08/18/2022: Cholesterol 182; HDL 96.40; LDL Cholesterol 73; Total CHOL/HDL Ratio 2; Triglycerides 62.0; VLDL 12.4   CrCl cannot be calculated (Patient's most recent lab result is older than the maximum 21 days allowed.).   Wt Readings from Last 3 Encounters:  08/18/22 139 lb (63 kg)  07/21/22 138 lb 9.6 oz (62.9 kg)  04/14/22 139 lb 3.2 oz (63.1 kg)     Other studies reviewed: Additional studies/records reviewed today include: summarized above  ASSESSMENT AND PLAN:  1. Cardiac arrest     Collapsed at the gym, presumed VT/VF with AED advised shock     Event is unexplained  2. ICD     *** Intact function, no programming changes made    Disposition: ***  Current medicines are reviewed at length with the patient today.  The patient did not have any concerns regarding medicines.  Venetia Night, PA-C 09/15/2022 4:54 PM     [address] [place] [address] [phone] (office)  [phone] (fax)

## 2022-09-16 ENCOUNTER — Ambulatory Visit: Payer: Medicare Other | Attending: Physician Assistant | Admitting: Physician Assistant

## 2022-09-16 ENCOUNTER — Encounter: Payer: Self-pay | Admitting: Physician Assistant

## 2022-09-16 VITALS — BP 124/74 | HR 83 | Ht 67.0 in | Wt 138.0 lb

## 2022-09-16 DIAGNOSIS — Z95810 Presence of automatic (implantable) cardiac defibrillator: Secondary | ICD-10-CM | POA: Diagnosis present

## 2022-09-16 DIAGNOSIS — I469 Cardiac arrest, cause unspecified: Secondary | ICD-10-CM | POA: Diagnosis present

## 2022-09-16 LAB — CUP PACEART INCLINIC DEVICE CHECK
Battery Remaining Longevity: 96 mo
Brady Statistic RV Percent Paced: 0 %
Date Time Interrogation Session: 20231129133644
HighPow Impedance: 74.25 Ohm
Implantable Lead Connection Status: 753985
Implantable Lead Implant Date: 20210603
Implantable Lead Location: 753860
Implantable Pulse Generator Implant Date: 20210603
Lead Channel Impedance Value: 437.5 Ohm
Lead Channel Sensing Intrinsic Amplitude: 12 mV
Lead Channel Setting Pacing Amplitude: 2.5 V
Lead Channel Setting Pacing Pulse Width: 0.5 ms
Lead Channel Setting Sensing Sensitivity: 0.5 mV
Pulse Gen Serial Number: 111007674

## 2022-09-16 NOTE — Patient Instructions (Signed)
Medication Instructions:   Your physician recommends that you continue on your current medications as directed. Please refer to the Current Medication list given to you today.   *If you need a refill on your cardiac medications before your next appointment, please call your pharmacy*   Lab Work: [place]    If you have labs (blood work) drawn today and your tests are completely normal, you will receive your results only by: [place] (if you have MyChart) OR A paper copy in the mail If you have any lab test that is abnormal or we need to change your treatment, we will call you to review the results.   Testing/Procedures: NONE ORDERED  TODAY    Follow-Up: At [facility], you and your health needs are our priority.  As part of our continuing mission to provide you with exceptional heart care, we have created designated Provider Care Teams.  These Care Teams include your primary Cardiologist (physician) and Advanced Practice Providers (APPs -  Physician Assistants and Nurse Practitioners) who all work together to provide you with the care you need, when you need it.  We recommend signing up for the patient portal called "MyChart".  Sign up information is provided on this After Visit Summary.  MyChart is used to connect with patients for Virtual Visits (Telemedicine).  Patients are able to view lab/test results, encounter notes, upcoming appointments, etc.  Non-urgent messages can be sent to your provider as well.   To learn more about what you can do with MyChart, go to [email or web address].    Your next appointment:   1 year(s)  The format for your next appointment:   In Person  Provider:   You may see Dr. [name] or one of the following Advanced Practice Providers on your designated Care Team:   Tommye Standard, PA-C   Other Instructions   Important Information About [place]

## 2022-09-21 ENCOUNTER — Encounter: Payer: Medicare Other | Admitting: Gastroenterology

## 2022-09-22 ENCOUNTER — Ambulatory Visit (INDEPENDENT_AMBULATORY_CARE_PROVIDER_SITE_OTHER): Payer: Medicare Other

## 2022-09-22 DIAGNOSIS — Z95810 Presence of automatic (implantable) cardiac defibrillator: Secondary | ICD-10-CM

## 2022-09-22 DIAGNOSIS — I469 Cardiac arrest, cause unspecified: Secondary | ICD-10-CM

## 2022-09-22 LAB — CUP PACEART REMOTE DEVICE CHECK
Battery Remaining Longevity: 94 mo
Battery Remaining Percentage: 77 %
Battery Voltage: 2.99 V
Brady Statistic RV Percent Paced: 1 %
Date Time Interrogation Session: 20231205021035
HighPow Impedance: 71 Ohm
Implantable Lead Connection Status: 753985
Implantable Lead Implant Date: 20210603
Implantable Lead Location: 753860
Implantable Pulse Generator Implant Date: 20210603
Lead Channel Impedance Value: 440 Ohm
Lead Channel Pacing Threshold Amplitude: 0.75 V
Lead Channel Pacing Threshold Pulse Width: 0.5 ms
Lead Channel Sensing Intrinsic Amplitude: 12 mV
Lead Channel Setting Pacing Amplitude: 2.5 V
Lead Channel Setting Pacing Pulse Width: 0.5 ms
Lead Channel Setting Sensing Sensitivity: 0.5 mV
Pulse Gen Serial Number: 111007674

## 2022-10-20 NOTE — Progress Notes (Signed)
Remote ICD transmission.   

## 2022-11-04 ENCOUNTER — Encounter: Payer: Medicare Other | Admitting: Gastroenterology

## 2022-11-05 ENCOUNTER — Other Ambulatory Visit: Payer: Self-pay

## 2022-11-05 DIAGNOSIS — G4733 Obstructive sleep apnea (adult) (pediatric): Secondary | ICD-10-CM

## 2022-11-19 NOTE — Progress Notes (Signed)
Left prior to being seen .  

## 2022-11-20 ENCOUNTER — Ambulatory Visit
Admission: RE | Admit: 2022-11-20 | Discharge: 2022-11-20 | Disposition: A | Discharge status: Home / Self Care | Payer: Medicare Other | Source: Ambulatory Visit | Attending: Family Medicine | Admitting: Family Medicine

## 2022-11-20 DIAGNOSIS — Z1231 Encounter for screening mammogram for malignant neoplasm of breast: Secondary | ICD-10-CM

## 2022-11-20 DIAGNOSIS — E2839 Other primary ovarian failure: Secondary | ICD-10-CM

## 2022-11-20 DIAGNOSIS — M816 Localized osteoporosis [Lequesne]: Secondary | ICD-10-CM

## 2022-11-23 ENCOUNTER — Ambulatory Visit: Payer: Medicare Other | Admitting: Internal Medicine

## 2022-11-24 ENCOUNTER — Ambulatory Visit (AMBULATORY_SURGERY_CENTER): Payer: Medicare Other | Admitting: *Deleted

## 2022-11-24 VITALS — Ht 66.5 in | Wt 137.5 lb

## 2022-11-24 DIAGNOSIS — Z1211 Encounter for screening for malignant neoplasm of colon: Secondary | ICD-10-CM

## 2022-11-24 MED ORDER — PEG 3350-KCL-NA BICARB-NACL 420 G PO SOLR: 4000.0000 mL | Freq: Once | ORAL | 0 refills | Status: AC

## 2022-11-24 NOTE — Progress Notes (Signed)
No egg or soy allergy known to patient  No issues known to pt with past sedation with any surgeries or procedures Patient denies ever being told they had issues or difficulty with intubation  No issues moving neck or head and no issues swallowing No FH of Malignant Hyperthermia Pt is not on diet pills Pt is not on  home 02  Pt is not on blood thinners  Pt denies issues with constipation  Pt is not on dialysis Pt denies any upcoming cardiac testing Pt encouraged to use to use Singlecare or Goodrx to reduce cost  Patient's chart reviewed by Osvaldo Angst CNRA prior to previsit and patient appropriate for the [place].  Previsit completed and red dot placed by patient's name on their procedure day (on provider's schedule).  . Visit by phone Instructions reviewed with pt and pt states understanding. Instructed to review again prior to procedure. Pt states they will.  Instructions sent by mail and my chart

## 2022-11-30 ENCOUNTER — Telehealth: Payer: Self-pay | Admitting: Internal Medicine

## 2022-11-30 NOTE — Telephone Encounter (Signed)
Patient states she is having a colonoscopy on 12/14/22. The office performing the procedure is not requesting clearance, but she would like to know for herself whether or not she is cleared from a cardiac standpoint due to having a defibrillator. Please advise.

## 2022-12-01 ENCOUNTER — Telehealth: Payer: Self-pay

## 2022-12-01 NOTE — Telephone Encounter (Signed)
Patient called back today and would like a call back for reassurance that having her colonoscopy is ok with her ICD

## 2022-12-01 NOTE — Telephone Encounter (Signed)
Patient is up to date with in office and remote checks.  She has normal device function. For colonoscopies our recommendation is to use a magnet for patient' with defibrillators. No programming changes are needed.   I have shared this with patient.  She is anxious and does not feel confident, even though she has been reassured that they are prepared for her procedure tomorrow.   Patient asks that I call their office ([facility] office) to make them aware.    I spoke with Lauren in procedure scheduling at [facility]. She says that patient is actually not scheduled until 12/11/22 for her colonoscopy and they will be faxing [place] over a clearance request beforehand. They were given the direct fax number to device office here to send the request.  Lauren was going to call patient now and clarify all information and reassure her about obtaining the clearance.

## 2022-12-01 NOTE — Telephone Encounter (Signed)
Received call from [facility] at the device clinic stating that pt has an ICD and usually they recommend a magnet if there is going to be cauterization or an electrical current. Leafy Ro said their fax number is [phone].  Pt called device clinic because she was concerned about having colonoscopy on 12/14/22. Called pt and let her know that I would let Dr. Bryan Lemma know. Pt was still very concerned. Let pt know I would give her a call back once Dr. Bryan Lemma gets back to me. I need to get clearance from cardiology right?

## 2022-12-02 ENCOUNTER — Telehealth: Payer: Self-pay

## 2022-12-02 NOTE — Telephone Encounter (Signed)
She was most recently seen in the cardiology clinic on 09/16/2022.  Was doing well.  Has ICD in place after VF arrest in 03/2020.  We typically minimize the use of electrocautery for elective, screening colonoscopy, but also have magnet available. Ok to get formal cardiology clearance to proceed with screening colonoscopy.

## 2022-12-02 NOTE — Telephone Encounter (Signed)
Spoke with pt and let pt know Dr. Vivia Ewing message. Pt verbalized understanding and had no other concerns at end of call. Cardiac clearance requested.

## 2022-12-02 NOTE — Telephone Encounter (Signed)
Left message for the patient to contact office.  Left message on spouse voicemail as well requesting the patient contact the office.

## 2022-12-02 NOTE — Telephone Encounter (Signed)
[facility] Pre-operative Risk Assessment     Request for surgical clearance:     Endoscopy Procedure  What type of surgery is being performed?     Colonoscopy   When is this surgery scheduled?    12/14/22  What type of clearance is required ? medical  Are there any medications that need to be held prior to surgery and how long? no  Practice name and name of physician performing surgery?      [facility]/ Dr. Bryan Lemma   What is your office phone and fax number?      Phone- [phone]  [phone]  Anesthesia type (None, local, MAC, general) ?       MAC

## 2022-12-02 NOTE — Telephone Encounter (Signed)
Spoke with the patient who is agreeable with telehealth appointment.   Med list reviewed and consent given. Patient voiced understanding of process and what to expect for telehealth appt.

## 2022-12-02 NOTE — Telephone Encounter (Signed)
   Name: Allison Cannon  DOB: Feb 11, 1956  MRN: [id number]  Primary Cardiologist: Cristopher Peru, MD   Preoperative team, please contact this patient and set up a phone call appointment for further preoperative risk assessment. Please obtain consent and complete medication review. Thank you for your help.  I confirm that guidance regarding antiplatelet and oral anticoagulation therapy has been completed and, if necessary, noted below.  No medications requested or indicated.   Mable Fill, Marissa Nestle, NP 12/02/2022, 12:17 PM [place]

## 2022-12-02 NOTE — Telephone Encounter (Signed)
  Patient Consent for Virtual Visit         Allison Cannon has provided verbal consent on 12/02/2022 for a virtual visit (video or telephone).   CONSENT FOR VIRTUAL VISIT FOR:  Allison Cannon  By participating in this virtual visit I agree to the following:  I hereby voluntarily request, consent and authorize [facility] and its employed or contracted physicians, physician assistants, nurse practitioners or other licensed health care professionals (the Practitioner), to provide me with telemedicine health care services (the "Services") as deemed necessary by the treating Practitioner. I acknowledge and consent to receive the Services by the Practitioner via telemedicine. I understand that the telemedicine visit will involve communicating with the Practitioner through live audiovisual communication technology and the disclosure of certain medical information by electronic transmission. I acknowledge that I have been given the opportunity to request an in-person assessment or other available alternative prior to the telemedicine visit and am voluntarily participating in the telemedicine visit.  I understand that I have the right to withhold or withdraw my consent to the use of telemedicine in the course of my care at any time, without affecting my right to future care or treatment, and that the Practitioner or I may terminate the telemedicine visit at any time. I understand that I have the right to inspect all information obtained and/or recorded in the course of the telemedicine visit and may receive copies of available information for a reasonable fee.  I understand that some of the potential risks of receiving the Services via telemedicine include:  Delay or interruption in medical evaluation due to technological equipment failure or disruption; Information transmitted may not be sufficient (e.g. poor resolution of images) to allow for appropriate medical decision making by the Practitioner;  and/or  In rare instances, security protocols could fail, causing a breach of personal health information.  Furthermore, I acknowledge that it is my responsibility to provide information about my medical history, conditions and care that is complete and accurate to the best of my ability. I acknowledge that Practitioner's advice, recommendations, and/or decision may be based on factors not within their control, such as incomplete or inaccurate data provided by me or distortions of diagnostic images or specimens that may result from electronic transmissions. I understand that the practice of medicine is not an exact science and that Practitioner makes no warranties or guarantees regarding treatment outcomes. I acknowledge that a copy of this consent can be made available to me via my patient portal ([facility]), or I can request a printed copy by calling the office of [facility].    I understand that my insurance will be billed for this visit.   I have read or had this consent read to me. I understand the contents of this consent, which adequately explains the benefits and risks of the Services being provided via telemedicine.  I have been provided ample opportunity to ask questions regarding this consent and the Services and have had my questions answered to my satisfaction. I give my informed consent for the services to be provided through the use of telemedicine in my medical care

## 2022-12-07 NOTE — Progress Notes (Unsigned)
Virtual Visit via Telephone Note   Because of Allison Cannon co-morbid illnesses, she is at least at moderate risk for complications without adequate follow up.  This format is felt to be most appropriate for this patient at this time.  The patient did not have access to video technology/had technical difficulties with video requiring transitioning to audio format only (telephone).  All issues noted in this document were discussed and addressed.  No physical exam could be performed with this format.  Please refer to the patient's chart for her consent to telehealth for [facility].  Evaluation Performed:  Preoperative cardiovascular risk assessment _____________   Date:  12/07/2022   Patient ID:  Allison Cannon, DOB [birth year], MRN [id number] Patient Location:  Home Provider location:   Office  Primary Care Provider:  [name], NP-C Primary Cardiologist:  Cristopher Peru, MD  Chief Complaint / Patient Profile   67 y.o. y/o female with a h/o cardiac arrest s/p ICM implant 03/2020 with presumed VT/V-fib with AED advised shock, normal coronary arteries on cath 03/2020 who is pending colonoscopy and presents today for telephonic preoperative cardiovascular risk assessment.  History of Present Illness    Allison Cannon is a 67 y.o. female who presents via audio/video conferencing for a telehealth visit today.  Pt was last seen in cardiology clinic on 09/16/2022 by Tommye Standard, PA.  At that time Chyanna Kuenzel was doing well.  The patient is now pending procedure as outlined above. Since her last visit, she *** denies chest pain, shortness of breath, lower extremity edema, fatigue, palpitations, melena, hematuria, hemoptysis, diaphoresis, weakness, presyncope, syncope, orthopnea, and PND.   Past Medical History    Past Medical History:  Diagnosis Date   AICD (automatic cardioverter/defibrillator) present    Arthritis of left hip    Myocardial infarction [name])    Cardiac arrest  6/21   Osteoporosis    PONV (postoperative nausea and vomiting)    Sleep apnea    Past Surgical History:  Procedure Laterality Date   CHOLECYSTECTOMY     ICD IMPLANT N/A 03/21/2020   Procedure: ICD IMPLANT;  Surgeon: Evans Lance, MD;  Location: [place] CV LAB;  Service: Cardiovascular;  Laterality: N/A;   KNEE ARTHROSCOPY     x 2   LEFT HEART CATH AND CORONARY ANGIOGRAPHY N/A 03/21/2020   Procedure: LEFT HEART CATH AND CORONARY ANGIOGRAPHY;  Surgeon: Troy Sine, MD;  Location: [place] CV LAB;  Service: Cardiovascular;  Laterality: N/A;   SP CHOLECYSTOMY     TOTAL HIP ARTHROPLASTY Left 01/30/2021   Procedure: TOTAL HIP ARTHROPLASTY ANTERIOR APPROACH;  Surgeon: Rod Can, MD;  Location: [facility];  Service: Orthopedics;  Laterality: Left;    Allergies  No Known Allergies  Home Medications    Prior to Admission medications   Medication Sig Start Date End Date Taking? Authorizing Provider  acetaminophen (TYLENOL) 325 MG tablet Take 1-2 tablets (325-650 mg total) by mouth every 6 (six) hours as needed for mild pain (pain score 1-3 or temp > 100.5). 01/31/21   Dorothyann Peng, PA-C  Ascorbic Acid (VITAMIN C) 500 MG CAPS Take 500 mg by mouth daily. 08/29/22   [provider]  Cholecalciferol (VITAMIN D) 50 MCG (2000 UT) tablet Take 2,000 Units by mouth daily. Per patient taking 1,000 units daily    [provider]  ibuprofen (ADVIL) 200 MG tablet Take 200 mg by mouth every 8 (eight) hours as needed for mild pain. 08/29/22  [provider]  Magnesium 250 MG TABS Take 240 mg by mouth 2 (two) times daily. Per patient taking 120 mg twice a day    [provider]  pyridOXINE (VITAMIN B6) 100 MG tablet Take 100 mg by mouth daily. 08/29/22   [provider]    Physical Exam    Vital Signs:  Allahna Merida does not have vital signs available for review today.***  Given telephonic nature of communication, physical exam is  limited. AAOx3. NAD. Normal affect.  Speech and respirations are unlabored.  Accessory Clinical Findings    None  Assessment & Plan    1.  Preoperative Cardiovascular Risk Assessment:  The patient was advised that if she develops new symptoms prior to surgery to contact our office to arrange for a follow-up visit, and she verbalized understanding.  (Reminder: Include SBE prophylaxis/Antiplatelet/Anticoag Instructions***)  A copy of this note will be routed to requesting surgeon.  Time:   Today, I have spent *** minutes with the patient with telehealth technology discussing medical history, symptoms, and management plan.     Emmaline Life, NP  12/07/2022, 7:06 AM

## 2022-12-08 ENCOUNTER — Ambulatory Visit: Payer: Medicare Other | Attending: Cardiovascular Disease | Admitting: Nurse Practitioner

## 2022-12-08 ENCOUNTER — Encounter: Payer: Self-pay | Admitting: Nurse Practitioner

## 2022-12-08 DIAGNOSIS — Z01810 Encounter for preprocedural cardiovascular examination: Secondary | ICD-10-CM

## 2022-12-09 NOTE — Telephone Encounter (Signed)
Per 12/08/22 telephone cardiology visit. Pt has been cleared for colonoscopy as scheduled.

## 2022-12-14 ENCOUNTER — Ambulatory Visit (AMBULATORY_SURGERY_CENTER): Payer: Medicare Other | Admitting: Gastroenterology

## 2022-12-14 ENCOUNTER — Encounter: Payer: Self-pay | Admitting: Gastroenterology

## 2022-12-14 VITALS — BP 115/69 | HR 74 | Temp 96.0°F | Resp 21 | Ht 66.5 in | Wt 137.5 lb

## 2022-12-14 DIAGNOSIS — K635 Polyp of colon: Secondary | ICD-10-CM

## 2022-12-14 DIAGNOSIS — Z1211 Encounter for screening for malignant neoplasm of colon: Secondary | ICD-10-CM | POA: Diagnosis not present

## 2022-12-14 DIAGNOSIS — D123 Benign neoplasm of transverse colon: Secondary | ICD-10-CM

## 2022-12-14 DIAGNOSIS — K5730 Diverticulosis of large intestine without perforation or abscess without bleeding: Secondary | ICD-10-CM

## 2022-12-14 DIAGNOSIS — D128 Benign neoplasm of rectum: Secondary | ICD-10-CM

## 2022-12-14 DIAGNOSIS — K621 Rectal polyp: Secondary | ICD-10-CM

## 2022-12-14 MED ORDER — SODIUM CHLORIDE 0.9 % IV SOLN: 500.0000 mL | Freq: Once | INTRAVENOUS | Status: DC

## 2022-12-14 NOTE — Progress Notes (Signed)
GASTROENTEROLOGY PROCEDURE H&P NOTE   Primary Care Physician: [name], NP-C    Reason for Procedure:  Colon Cancer screening  Plan:    Colonoscopy  Patient is appropriate for endoscopic procedure(s) in the ambulatory ([place]) setting.  The nature of the procedure, as well as the risks, benefits, and alternatives were carefully and thoroughly reviewed with the patient. Ample time for discussion and questions allowed. The patient understood, was satisfied, and agreed to proceed.     HPI: Allison Cannon is a 67 y.o. female who presents for colonoscopy for routine Colon Cancer screening.  Fhx notable for sister with Colon cancer (age 60), brother w colon polyps. Patient is otherwise without complaints or active issues today.  Past Medical History:  Diagnosis Date   AICD (automatic cardioverter/defibrillator) present    Arthritis of left hip    Myocardial infarction [facility])    Cardiac arrest 6/21   Osteoporosis    PONV (postoperative nausea and vomiting)    Sleep apnea     Past Surgical History:  Procedure Laterality Date   CHOLECYSTECTOMY     ICD IMPLANT N/A 03/21/2020   Procedure: ICD IMPLANT;  Surgeon: Evans Lance, MD;  Location: [place] CV LAB;  Service: Cardiovascular;  Laterality: N/A;   KNEE ARTHROSCOPY     x 2   LEFT HEART CATH AND CORONARY ANGIOGRAPHY N/A 03/21/2020   Procedure: LEFT HEART CATH AND CORONARY ANGIOGRAPHY;  Surgeon: Troy Sine, MD;  Location: [place] CV LAB;  Service: Cardiovascular;  Laterality: N/A;   SP CHOLECYSTOMY     TOTAL HIP ARTHROPLASTY Left 01/30/2021   Procedure: TOTAL HIP ARTHROPLASTY ANTERIOR APPROACH;  Surgeon: Rod Can, MD;  Location: [facility];  Service: Orthopedics;  Laterality: Left;    Prior to Admission medications   Medication Sig Start Date End Date Taking? Authorizing Provider  acetaminophen (TYLENOL) 325 MG tablet Take 1-2 tablets (325-650 mg total) by mouth every 6 (six) hours as needed for mild pain  (pain score 1-3 or temp > 100.5). 01/31/21  Yes Dorothyann Peng, PA-C  Ascorbic Acid (VITAMIN C) 500 MG CAPS Take 500 mg by mouth daily. 08/29/22  Yes [provider]  Cholecalciferol (VITAMIN D) 50 MCG (2000 UT) tablet Take 2,000 Units by mouth daily. Per patient taking 1,000 units daily   Yes [provider]  Magnesium 250 MG TABS Take 240 mg by mouth 2 (two) times daily. Per patient taking 120 mg twice a day   Yes [provider]  pyridOXINE (VITAMIN B6) 100 MG tablet Take 100 mg by mouth daily. 08/29/22  Yes [provider]  ibuprofen (ADVIL) 200 MG tablet Take 200 mg by mouth every 8 (eight) hours as needed for mild pain. 08/29/22   [provider]    Current Outpatient Medications  Medication Sig Dispense Refill   acetaminophen (TYLENOL) 325 MG tablet Take 1-2 tablets (325-650 mg total) by mouth every 6 (six) hours as needed for mild pain (pain score 1-3 or temp > 100.5). 100 tablet 0   Ascorbic Acid (VITAMIN C) 500 MG CAPS Take 500 mg by mouth daily.     Cholecalciferol (VITAMIN D) 50 MCG (2000 UT) tablet Take 2,000 Units by mouth daily. Per patient taking 1,000 units daily     Magnesium 250 MG TABS Take 240 mg by mouth 2 (two) times daily. Per patient taking 120 mg twice a day     pyridOXINE (VITAMIN B6) 100 MG tablet Take 100 mg by mouth  daily.     ibuprofen (ADVIL) 200 MG tablet Take 200 mg by mouth every 8 (eight) hours as needed for mild pain.     Current Facility-Administered Medications  Medication Dose Route Frequency Provider Last Rate Last Admin   0.9 %  sodium chloride infusion  500 mL Intravenous Once Shulem Mader V, DO        Allergies as of 12/14/2022   (No Known Allergies)    Family History  Problem Relation Age of Onset   Stroke Mother    Breast cancer Mother 39   Stroke Father    Colon cancer Sister 56   Colon polyps Brother    Cancer Brother    Colon polyps Brother    Esophageal cancer Neg Hx    Rectal  cancer Neg Hx    Stomach cancer Neg Hx     Social History   Socioeconomic History   Marital status: Married    Spouse name: Not on file   Number of children: 0   Years of education: Not on file   Highest education level: Not on file  Occupational History   Not on file  Tobacco Use   Smoking status: Never   Smokeless tobacco: Never  Vaping Use   Vaping Use: Never used  Substance and Sexual Activity   Alcohol use: Yes    Alcohol/week: 1.0 - 5.0 standard drink of alcohol    Types: 1 - 5 Glasses of wine per week    Comment: glass daily    Drug use: Never   Sexual activity: Not Currently  Other Topics Concern   Not on file  Social History Narrative   ** Merged History Encounter **       Social Determinants of Health   Financial Resource Strain: Low Risk  (08/18/2022)   Overall Financial Resource Strain (CARDIA)    Difficulty of Paying Living Expenses: Not hard at all  Food Insecurity: No Food Insecurity (09/01/2022)   Hunger Vital Sign    Worried About Running Out of Food in the Last Year: Never true    Ran Out of Food in the Last Year: Never true  Transportation Needs: No Transportation Needs (09/01/2022)   PRAPARE - Hydrologist (Medical): No    Lack of Transportation (Non-Medical): No  Physical Activity: Sufficiently Active (08/18/2022)   Exercise Vital Sign    Days of Exercise per Week: 5 days    Minutes of Exercise per Session: 60 min  Stress: No Stress Concern Present (08/18/2022)   [place]    Feeling of Stress : Not at all  Social Connections: Not on file  Intimate Partner Violence: Not At Risk (08/18/2022)   Humiliation, Afraid, Rape, and Kick questionnaire    Fear of Current or Ex-Partner: No    Emotionally Abused: No    Physically Abused: No    Sexually Abused: No    Physical Exam: Vital signs in last 24 hours: '@BP'$  133/71   Pulse 92   Temp (!) 96 F  (35.6 C)   Ht 5' 6.5" (1.689 m)   Wt 137 lb 8 oz (62.4 kg)   SpO2 97%   BMI 21.86 kg/m  GEN: NAD EYE: Sclerae anicteric ENT: MMM CV: Non-tachycardic Pulm: CTA b/l GI: Soft, NT/ND NEURO:  Alert & Oriented x 3   Gerrit Heck, DO [facility]   12/14/2022 8:51 AM \

## 2022-12-14 NOTE — Patient Instructions (Signed)
Resume previous diet and medications. Awaiting pathology results. Repeat Colonoscopy date to be determined based on pathology results.  Handouts provided [id number] polyps and Diverticulosis   YOU HAD AN ENDOSCOPIC PROCEDURE TODAY AT [place]:   Refer to the procedure report that was given to you for any specific questions about what was found during the examination.  If the procedure report does not answer your questions, please call your gastroenterologist to clarify.  If you requested that your care partner not be given the details of your procedure findings, then the procedure report has been included in a sealed envelope for you to review at your convenience later.  YOU SHOULD EXPECT: Some feelings of bloating in the abdomen. Passage of more gas than usual.  Walking can help get rid of the air that was put into your GI tract during the procedure and reduce the bloating. If you had a lower endoscopy (such as a colonoscopy or flexible sigmoidoscopy) you may notice spotting of blood in your stool or on the toilet paper. If you underwent a bowel prep for your procedure, you may not have a normal bowel movement for a few days.  Please Note:  You might notice some irritation and congestion in your nose or some drainage.  This is from the oxygen used during your procedure.  There is no need for concern and it should clear up in a day or so.  SYMPTOMS TO REPORT IMMEDIATELY:  Following lower endoscopy (colonoscopy or flexible sigmoidoscopy):  Excessive amounts of blood in the stool  Significant tenderness or worsening of abdominal pains  Swelling of the abdomen that is new, acute  Fever of 100F or higher  For urgent or emergent issues, a gastroenterologist can be reached at any hour by calling [phone]. Do not use MyChart messaging for urgent concerns.    DIET:  We do recommend a small meal at first, but then you may proceed to your regular diet.  Drink plenty of fluids but  you should avoid alcoholic beverages for 24 hours.  ACTIVITY:  You should plan to take it easy for the rest of today and you should NOT DRIVE or use heavy machinery until tomorrow (because of the sedation medicines used during the test).    FOLLOW UP: Our staff will call the number listed on your records the next business day following your procedure.  We will call around 7:15- 8:00 am to check on you and address any questions or concerns that you may have regarding the information given to you following your procedure. If we do not reach you, we will leave a message.     If any biopsies were taken you will be contacted by phone or by letter within the next 1-3 weeks.  Please call us at [phone] if you have not heard about the biopsies in 3 weeks.    SIGNATURES/CONFIDENTIALITY: You and/or your care partner have signed paperwork which will be entered into your electronic medical record.  These signatures attest to the fact that that the information above on your After Visit Summary has been reviewed and is understood.  Full responsibility of the confidentiality of this discharge information lies with you and/or your care-partner.

## 2022-12-14 NOTE — Progress Notes (Signed)
Called to room to assist during endoscopic procedure.  Patient ID and intended procedure confirmed with present staff. Received instructions for my participation in the procedure from the performing physician.  

## 2022-12-14 NOTE — Op Note (Signed)
[place] [name] Procedure Date: 12/14/2022 8:43 AM MRN: [id number] Endoscopist: Gerrit Heck , MD, [id number] Age: 67 Referring MD:  Date of Birth: May 06, 1956 Gender: Female Account #: [id number] Procedure:                Colonoscopy Indications:              Screening in patient at increased risk: Colorectal                            cancer in sister before age 73                           Last colonoscopy was 10 years ago. Otherwise, no                            recent GI symptoms. Medicines:                Monitored Anesthesia Care Procedure:                Pre-Anesthesia Assessment:                           - Prior to the procedure, a History and Physical                            was performed, and patient medications and                            allergies were reviewed. The patient's tolerance of                            previous anesthesia was also reviewed. The risks                            and benefits of the procedure and the sedation                            options and risks were discussed with the patient.                            All questions were answered, and informed consent                            was obtained. Prior Anticoagulants: The patient has                            taken no anticoagulant or antiplatelet agents. ASA                            Grade Assessment: III - A patient with severe                            systemic disease. After reviewing the risks and  benefits, the patient was deemed in satisfactory                            condition to undergo the procedure.                           After obtaining informed consent, the colonoscope                            was passed under direct vision. Throughout the                            procedure, the patient's blood pressure, pulse, and                            oxygen saturations were monitored continuously. The                             CF [id number] was introduced through the anus                            and advanced to the the cecum, identified by                            appendiceal orifice and ileocecal valve. The                            colonoscopy was technically difficult and complex                            due to a tortuous colon. Successful completion of                            the procedure was aided by applying abdominal                            pressure. The patient tolerated the procedure well.                            The quality of the bowel preparation was good. The                            ileocecal valve, appendiceal orifice, and rectum                            were photographed. Scope In: 8:59:17 AM Scope Out: 9:24:03 AM Scope Withdrawal Time: 0 hours 15 minutes 52 seconds  Total Procedure Duration: 0 hours 24 minutes 46 seconds  Findings:                 The perianal and digital rectal examinations were                            normal.  An 8 mm polyp was found in the transverse colon.                            The polyp was semi-sessile. The polyp was removed                            with a cold snare. Resection and retrieval were                            complete. Estimated blood loss was minimal.                           A 3 mm polyp was found in the rectum. The polyp was                            sessile. The polyp was removed with a cold snare.                            Resection and retrieval were complete. Estimated                            blood loss was minimal.                           Multiple medium-mouthed and small-mouthed                            diverticula were found in the sigmoid colon.                           The sigmoid colon was significantly tortuous.                            Advancing the scope required using manual pressure                            and water submersion.                            The retroflexed view of the distal rectum and anal                            verge was normal and showed no anal or rectal                            abnormalities. Complications:            No immediate complications. Estimated Blood Loss:     Estimated blood loss was minimal. Impression:               - One 8 mm polyp in the transverse colon, removed                            with a cold snare. Resected and retrieved.                           -  One 3 mm polyp in the rectum, removed with a cold                            snare. Resected and retrieved.                           - Diverticulosis in the sigmoid colon.                           - Tortuous colon.                           - The distal rectum and anal verge are normal on                            retroflexion view. Recommendation:           - Patient has a contact number available for                            emergencies. The signs and symptoms of potential                            delayed complications were discussed with the                            patient. Return to normal activities tomorrow.                            Written discharge instructions were provided to the                            patient.                           - Resume previous diet.                           - Continue present medications.                           - Await pathology results.                           - Repeat colonoscopy in 5 years for surveillance                            based on pathology results.                           - Return to GI office PRN. Gerrit Heck, MD 12/14/2022 9:29:34 AM

## 2022-12-14 NOTE — Progress Notes (Signed)
Sedate, gd SR, tolerated procedure well, VSS, report to RN 

## 2022-12-15 ENCOUNTER — Telehealth: Payer: Self-pay | Admitting: *Deleted

## 2022-12-15 NOTE — Telephone Encounter (Signed)
  Follow up Call-     12/14/2022    7:40 AM  Call back number  Post procedure Call Back phone  # [phone]  Permission to leave phone message Yes     Patient questions:  Do you have a fever, pain , or abdominal swelling? No. Pain Score  0 *  Have you tolerated food without any problems? Yes.    Have you been able to return to your normal activities? Yes.    Do you have any questions about your discharge instructions: Diet   No. Medications  No. Follow up visit  No.  Do you have questions or concerns about your Care? No.  Actions: * If pain score is 4 or above: No action needed, pain <4.

## 2022-12-21 ENCOUNTER — Encounter: Payer: Self-pay | Admitting: Gastroenterology

## 2022-12-21 ENCOUNTER — Ambulatory Visit: Payer: Medicare Other | Admitting: Internal Medicine

## 2022-12-22 ENCOUNTER — Ambulatory Visit (INDEPENDENT_AMBULATORY_CARE_PROVIDER_SITE_OTHER): Payer: Medicare Other

## 2022-12-22 DIAGNOSIS — I469 Cardiac arrest, cause unspecified: Secondary | ICD-10-CM

## 2022-12-24 LAB — CUP PACEART REMOTE DEVICE CHECK
Battery Remaining Longevity: 92 mo
Battery Remaining Percentage: 75 %
Battery Voltage: 2.99 V
Brady Statistic RV Percent Paced: 1 %
Date Time Interrogation Session: 20240306152004
HighPow Impedance: 79 Ohm
Implantable Lead Connection Status: 753985
Implantable Lead Implant Date: 20210603
Implantable Lead Location: 753860
Implantable Pulse Generator Implant Date: 20210603
Lead Channel Impedance Value: 450 Ohm
Lead Channel Pacing Threshold Amplitude: 0.75 V
Lead Channel Pacing Threshold Pulse Width: 0.5 ms
Lead Channel Sensing Intrinsic Amplitude: 12 mV
Lead Channel Setting Pacing Amplitude: 2.5 V
Lead Channel Setting Pacing Pulse Width: 0.5 ms
Lead Channel Setting Sensing Sensitivity: 0.5 mV
Pulse Gen Serial Number: 111007674

## 2022-12-30 ENCOUNTER — Encounter: Payer: Self-pay | Admitting: Family Medicine

## 2022-12-30 ENCOUNTER — Ambulatory Visit (INDEPENDENT_AMBULATORY_CARE_PROVIDER_SITE_OTHER): Payer: Medicare Other | Admitting: Family Medicine

## 2022-12-30 VITALS — BP 122/76 | HR 87 | Temp 97.6°F | Ht 66.5 in | Wt 136.0 lb

## 2022-12-30 DIAGNOSIS — L821 Other seborrheic keratosis: Secondary | ICD-10-CM

## 2022-12-30 DIAGNOSIS — Z8742 Personal history of other diseases of the female genital tract: Secondary | ICD-10-CM | POA: Diagnosis not present

## 2022-12-30 DIAGNOSIS — D2239 Melanocytic nevi of other parts of face: Secondary | ICD-10-CM | POA: Diagnosis not present

## 2022-12-30 DIAGNOSIS — E2839 Other primary ovarian failure: Secondary | ICD-10-CM | POA: Diagnosis not present

## 2022-12-30 DIAGNOSIS — M816 Localized osteoporosis [Lequesne]: Secondary | ICD-10-CM

## 2022-12-30 DIAGNOSIS — D1801 Hemangioma of skin and subcutaneous tissue: Secondary | ICD-10-CM | POA: Insufficient documentation

## 2022-12-30 HISTORY — DX: Other seborrheic keratosis: L82.1

## 2022-12-30 MED ORDER — IBANDRONATE SODIUM 150 MG PO TABS: 150.0000 mg | ORAL_TABLET | ORAL | 3 refills | Status: DC

## 2022-12-30 NOTE — Patient Instructions (Addendum)
  Start Boniva as discussed. Full glass of water on empty stomach. Sit upright for at least one hour.   Continue getting at least 1,200 mg of calcium in your diet. Take vitamin D 1,000 IUs daily (in the multivitamin) and continue weight bearing exercises.   Repeat DEXA every 2 years.   One A Day Women over 50 multi-vitamin or Centrum Silver  Magnesium   You will hear from [facility] and the gynecologist.
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
## 2023-01-29 NOTE — Progress Notes (Signed)
Remote ICD transmission.   

## 2023-03-18 ENCOUNTER — Encounter: Payer: Self-pay | Admitting: Family Medicine

## 2023-03-18 ENCOUNTER — Encounter: Payer: Self-pay | Admitting: Internal Medicine

## 2023-03-18 DIAGNOSIS — Z8742 Personal history of other diseases of the female genital tract: Secondary | ICD-10-CM

## 2023-03-18 NOTE — Progress Notes (Signed)
A user error has taken place.

## 2023-03-18 NOTE — Telephone Encounter (Signed)
Placed new gynecology referral.  Pt would like to know if you recommend for her to stay w her current dermatologist or move to [facility] to follow up since you recommended?

## 2023-03-23 ENCOUNTER — Ambulatory Visit (INDEPENDENT_AMBULATORY_CARE_PROVIDER_SITE_OTHER): Payer: Medicare Other

## 2023-03-23 DIAGNOSIS — I469 Cardiac arrest, cause unspecified: Secondary | ICD-10-CM

## 2023-03-24 LAB — CUP PACEART REMOTE DEVICE CHECK
Battery Remaining Longevity: 89 mo
Battery Remaining Percentage: 73 %
Battery Voltage: 2.98 V
Brady Statistic RV Percent Paced: 1 %
Date Time Interrogation Session: 20240604065332
HighPow Impedance: 74 Ohm
Implantable Lead Connection Status: 753985
Implantable Lead Implant Date: 20210603
Implantable Lead Location: 753860
Implantable Pulse Generator Implant Date: 20210603
Lead Channel Impedance Value: 450 Ohm
Lead Channel Pacing Threshold Amplitude: 0.75 V
Lead Channel Pacing Threshold Pulse Width: 0.5 ms
Lead Channel Sensing Intrinsic Amplitude: 12 mV
Lead Channel Setting Pacing Amplitude: 2.5 V
Lead Channel Setting Pacing Pulse Width: 0.5 ms
Lead Channel Setting Sensing Sensitivity: 0.5 mV
Pulse Gen Serial Number: 111007674

## 2023-04-07 ENCOUNTER — Ambulatory Visit: Payer: Medicare Other | Admitting: Dermatology

## 2023-04-15 NOTE — Progress Notes (Signed)
Remote ICD transmission.   

## 2023-06-22 ENCOUNTER — Ambulatory Visit (INDEPENDENT_AMBULATORY_CARE_PROVIDER_SITE_OTHER): Payer: Medicare Other

## 2023-06-22 DIAGNOSIS — I469 Cardiac arrest, cause unspecified: Secondary | ICD-10-CM

## 2023-06-23 LAB — CUP PACEART REMOTE DEVICE CHECK
Battery Remaining Longevity: 86 mo
Battery Remaining Percentage: 71 %
Battery Voltage: 2.98 V
Brady Statistic RV Percent Paced: 1 %
Date Time Interrogation Session: 20240903030834
HighPow Impedance: 68 Ohm
Implantable Lead Connection Status: 753985
Implantable Lead Implant Date: 20210603
Implantable Lead Location: 753860
Implantable Pulse Generator Implant Date: 20210603
Lead Channel Impedance Value: 430 Ohm
Lead Channel Pacing Threshold Amplitude: 0.75 V
Lead Channel Pacing Threshold Pulse Width: 0.5 ms
Lead Channel Sensing Intrinsic Amplitude: 12 mV
Lead Channel Setting Pacing Amplitude: 2.5 V
Lead Channel Setting Pacing Pulse Width: 0.5 ms
Lead Channel Setting Sensing Sensitivity: 0.5 mV
Pulse Gen Serial Number: 111007674

## 2023-07-05 NOTE — Progress Notes (Signed)
Remote ICD transmission.   

## 2023-07-22 ENCOUNTER — Ambulatory Visit: Payer: Medicare Other | Admitting: Family Medicine

## 2023-07-22 ENCOUNTER — Encounter: Payer: Self-pay | Admitting: Family Medicine

## 2023-07-22 VITALS — BP 116/72 | HR 72 | Temp 97.6°F | Ht 66.5 in | Wt 137.0 lb

## 2023-07-22 DIAGNOSIS — G44209 Tension-type headache, unspecified, not intractable: Secondary | ICD-10-CM | POA: Diagnosis not present

## 2023-07-22 DIAGNOSIS — H60501 Unspecified acute noninfective otitis externa, right ear: Secondary | ICD-10-CM | POA: Diagnosis not present

## 2023-07-22 MED ORDER — NEOMYCIN-POLYMYXIN-HC 1 % OT SOLN: 3.0000 [drp] | Freq: Four times a day (QID) | OTIC | 0 refills | Status: DC

## 2023-07-22 NOTE — Progress Notes (Signed)
Subjective:     Patient ID: Allison Cannon, female    DOB: 26-Oct-1955, 67 y.o.   MRN: [id number]  Chief Complaint  Patient presents with   Headache    Headaches throughout the day and night, wondering if it may be ear related. Have been taking tylenol which helps    Headache  Associated symptoms include ear pain. Pertinent negatives include no back pain, blurred vision, coughing, dizziness, eye pain, fever, nausea, neck pain, photophobia, sore throat, tingling or vomiting.    Discussed the use of AI scribe software for clinical note transcription with the patient, who gave verbal consent to proceed.  History of Present Illness          States she was waking up with headaches last week. Also having headaches during the day for the past couple of days. Headache on the right side near her ear. Also c/o right ear pain and maxillary sinus tenderness (improving).  Right jaw soreness.  Tylenol helps symptoms.   Denies fever, chills, dizziness, vision changes, ST, numbness, tingling or weakness. No rash.     Health Maintenance Due  Topic Date Due   DTaP/Tdap/Td (1 - Tdap) Never done   Pneumonia Vaccine 52+ Years old (1 of 1 - PCV) Never done   Medicare Annual Wellness (AWV)  08/19/2023    Past Medical History:  Diagnosis Date   AICD (automatic cardioverter/defibrillator) present    Arthritis of left hip    Myocardial infarction [facility])    Cardiac arrest 6/21   Osteoporosis    PONV (postoperative nausea and vomiting)    Seborrheic keratoses 12/30/2022   Sleep apnea     Past Surgical History:  Procedure Laterality Date   CHOLECYSTECTOMY     ICD IMPLANT N/A 03/21/2020   Procedure: ICD IMPLANT;  Surgeon: Marinus Maw, MD;  Location: [facility];  Service: Cardiovascular;  Laterality: N/A;   KNEE ARTHROSCOPY     x 2   LEFT HEART CATH AND CORONARY ANGIOGRAPHY N/A 03/21/2020   Procedure: LEFT HEART CATH AND CORONARY ANGIOGRAPHY;  Surgeon: Lennette Bihari, MD;  Location:  [facility];  Service: Cardiovascular;  Laterality: N/A;   SP CHOLECYSTOMY     TOTAL HIP ARTHROPLASTY Left 01/30/2021   Procedure: TOTAL HIP ARTHROPLASTY ANTERIOR APPROACH;  Surgeon: Samson Frederic, MD;  Location: [facility];  Service: Orthopedics;  Laterality: Left;    Family History  Problem Relation Age of Onset   Stroke Mother    Breast cancer Mother 72   Stroke Father    Colon cancer Sister 56   Colon polyps Brother    Cancer Brother    Colon polyps Brother    Esophageal cancer Neg Hx    Rectal cancer Neg Hx    Stomach cancer Neg Hx     Social History   Socioeconomic History   Marital status: Married    Spouse name: Not on file   Number of children: 0   Years of education: Not on file   Highest education level: Not on file  Occupational History   Not on file  Tobacco Use   Smoking status: Never   Smokeless tobacco: Never  Vaping Use   Vaping status: Never Used  Substance and Sexual Activity   Alcohol use: Yes    Alcohol/week: 1.0 - 5.0 standard drink of alcohol    Types: 1 - 5 Glasses of wine per week    Comment: glass daily    Drug use: Never  Sexual activity: Not Currently  Other Topics Concern   Not on file  Social History Narrative   ** Merged History Encounter **       Social Determinants of Health   Financial Resource Strain: Low Risk  (08/18/2022)   Overall Financial Resource Strain (CARDIA)    Difficulty of Paying Living Expenses: Not hard at all  Food Insecurity: No Food Insecurity (09/01/2022)   Hunger Vital Sign    Worried About Running Out of Food in the Last Year: Never true    Ran Out of Food in the Last Year: Never true  Transportation Needs: No Transportation Needs (09/01/2022)   PRAPARE - Administrator, Civil Service (Medical): No    Lack of Transportation (Non-Medical): No  Physical Activity: Sufficiently Active (08/18/2022)   Exercise Vital Sign    Days of Exercise per Week: 5 days    Minutes of Exercise per  Session: 60 min  Stress: No Stress Concern Present (08/18/2022)   [employer] of Occupational Health - Occupational Stress Questionnaire    Feeling of Stress : Not at all  Social Connections: Not on file  Intimate Partner Violence: Not At Risk (08/18/2022)   Humiliation, Afraid, Rape, and Kick questionnaire    Fear of Current or Ex-Partner: No    Emotionally Abused: No    Physically Abused: No    Sexually Abused: No    Outpatient Medications Prior to Visit  Medication Sig Dispense Refill   acetaminophen (TYLENOL) 325 MG tablet Take 1-2 tablets (325-650 mg total) by mouth every 6 (six) hours as needed for mild pain (pain score 1-3 or temp > 100.5). 100 tablet 0   Ascorbic Acid (VITAMIN C) 500 MG CAPS Take 500 mg by mouth daily.     Cholecalciferol (VITAMIN D) 50 MCG (2000 UT) tablet Take 2,000 Units by mouth daily. Per patient taking 1,000 units daily     ibandronate (BONIVA) 150 MG tablet Take 1 tablet (150 mg total) by mouth every 30 (thirty) days. Take in the morning with a full glass of water, on an empty stomach, and do not take anything else by mouth or lie down for the next 30 min. 3 tablet 3   ibuprofen (ADVIL) 200 MG tablet Take 200 mg by mouth every 8 (eight) hours as needed for mild pain.     Magnesium 250 MG TABS Take 240 mg by mouth 2 (two) times daily. Per patient taking 120 mg twice a day     pyridOXINE (VITAMIN B6) 100 MG tablet Take 100 mg by mouth daily.     No facility-administered medications prior to visit.    No Known Allergies  Review of Systems  Constitutional:  Negative for chills, fever and malaise/fatigue.  HENT:  Positive for ear pain. Negative for congestion and sore throat.   Eyes:  Negative for blurred vision, double vision, photophobia and pain.  Respiratory:  Negative for cough and shortness of breath.   Cardiovascular:  Negative for chest pain, palpitations and leg swelling.  Gastrointestinal:  Negative for nausea and vomiting.   Musculoskeletal:  Negative for back pain and neck pain.  Skin:  Negative for rash.  Neurological:  Positive for headaches. Negative for dizziness, tingling and focal weakness.       Objective:    Physical Exam Constitutional:      General: She is not in acute distress.    Appearance: She is not ill-appearing.  HENT:     Head:  Jaw: No tenderness or pain on movement.     Right Ear: External ear normal. No decreased hearing noted. Swelling and tenderness present. No mastoid tenderness.     Left Ear: Tympanic membrane and external ear normal. Decreased hearing noted.     Ears:     Comments: Right ear canal with edema, inflammation and TTP. Left ear canal with mild cerumen at opening, no impaction.  No temporal artery fullness or tenderness     Mouth/Throat:     Mouth: Mucous membranes are moist.     Pharynx: Oropharynx is clear.  Eyes:     Extraocular Movements: Extraocular movements intact.     Conjunctiva/sclera: Conjunctivae normal.     Pupils: Pupils are equal, round, and reactive to light.  Neck:     Thyroid: No thyroid tenderness.     Vascular: No JVD.     Trachea: Trachea and phonation normal.  Cardiovascular:     Rate and Rhythm: Normal rate and regular rhythm.  Pulmonary:     Effort: Pulmonary effort is normal.     Breath sounds: Normal breath sounds.  Musculoskeletal:     Cervical back: Normal range of motion and neck supple. No tenderness. No spinous process tenderness or muscular tenderness. Normal range of motion.     Right lower leg: No edema.     Left lower leg: No edema.  Lymphadenopathy:     Cervical: No cervical adenopathy.     Right cervical: No superficial cervical adenopathy.    Left cervical: No superficial cervical adenopathy.  Skin:    General: Skin is warm and dry.     Findings: No erythema or rash.  Neurological:     General: No focal deficit present.     Mental Status: She is alert and oriented to person, place, and time.     Cranial  Nerves: No cranial nerve deficit.     Sensory: No sensory deficit.     Motor: No weakness.     Coordination: Coordination normal.     Gait: Gait normal.  Psychiatric:        Mood and Affect: Mood normal.        Behavior: Behavior normal.        Thought Content: Thought content normal.      BP 116/72 (BP Location: Left Arm, Patient Position: Sitting, Cuff Size: Large)   Pulse 72   Temp 97.6 F (36.4 C) (Temporal)   Ht 5' 6.5" (1.689 m)   Wt 137 lb (62.1 kg)   SpO2 98%   BMI 21.78 kg/m  Wt Readings from Last 3 Encounters:  07/22/23 137 lb (62.1 kg)  12/30/22 136 lb (61.7 kg)  12/14/22 137 lb 8 oz (62.4 kg)       Assessment & Plan:   Problem List Items Addressed This Visit   None Visit Diagnoses     Acute otitis externa of right ear, unspecified type    -  Primary   Relevant Medications   NEOMYCIN-POLYMYXIN-HYDROCORTISONE (CORTISPORIN) 1 % SOLN OTIC solution   Acute non intractable tension-type headache          No red flag symptoms. Neurological exam benign. No sinusitis.  Cortisporin otic prescribed. Continue Tylenol or ibuprofen.  Stay hydrated, get regular sleep and pay attention whether she may be clenching or grinding teeth.   I am having Jason Nest start on NEOMYCIN-POLYMYXIN-HYDROCORTISONE. I am also having her maintain her Magnesium, Vitamin D, acetaminophen, pyridOXINE, Vitamin C, ibuprofen, and ibandronate.  Meds ordered  this encounter  Medications   NEOMYCIN-POLYMYXIN-HYDROCORTISONE (CORTISPORIN) 1 % SOLN OTIC solution    Sig: Place 3 drops into the right ear 4 (four) times daily.    Dispense:  10 mL    Refill:  0    Order Specific Question:   Supervising Provider    Answer:   Hillard Danker A [[id number]]

## 2023-07-22 NOTE — Patient Instructions (Signed)
Use the ear drops in your right ear. Continue Tylenol or ibuprofen as needed.   Let me know if your symptoms are not improving in the next 2-3 days or if you have any new symptoms.   If you develop a rash, let us know right away.   Make sure you are not clenching or grinding your teeth.

## 2023-08-05 ENCOUNTER — Ambulatory Visit: Payer: Medicare Other

## 2023-08-05 VITALS — Ht 66.5 in | Wt 137.0 lb

## 2023-08-05 DIAGNOSIS — Z0000 Encounter for general adult medical examination without abnormal findings: Secondary | ICD-10-CM | POA: Diagnosis not present

## 2023-08-05 NOTE — Patient Instructions (Signed)
Allison Cannon , Thank you for taking time to come for your Medicare Wellness Visit. I appreciate your ongoing commitment to your health goals. Please review the following plan we discussed and let me know if I can assist you in the future.   Referrals/Orders/Follow-Ups/Clinician Recommendations: You are due for a Tetanus vaccine.  Keep up the good work.    This is a list of the screening recommended for you and due dates:  Health Maintenance  Topic Date Due   DTaP/Tdap/Td vaccine (1 - Tdap) Never done   COVID-19 Vaccine (8 - 2023-24 season) 08/07/2023*   Flu Shot  01/17/2024*   Medicare Annual Wellness Visit  08/04/2024   Mammogram  11/20/2024   Colon Cancer Screening  12/15/2027   Pneumonia Vaccine  Completed   DEXA scan (bone density measurement)  Completed   Hepatitis C Screening  Completed   Zoster (Shingles) Vaccine  Completed   HPV Vaccine  Aged Out  *Topic was postponed. The date shown is not the original due date.    Advanced directives: (In Chart) A copy of your advanced directives are scanned into your chart should your provider ever need it.  Next Medicare Annual Wellness Visit scheduled for next year: Yes

## 2023-08-05 NOTE — Progress Notes (Signed)
Subjective:   Allison Cannon is a 67 y.o. female who presents for Medicare Annual (Subsequent) preventive examination.  Visit Complete: Virtual I connected with  Allison Cannon on 08/05/23 by a audio enabled telemedicine application and verified that I am speaking with the correct person using two identifiers.  Patient Location: Home  Provider Location: Office/Clinic  I discussed the limitations of evaluation and management by telemedicine. The patient expressed understanding and agreed to proceed.  Vital Signs: Because this visit was a virtual/telehealth visit, some criteria may be missing or patient reported. Any vitals not documented were not able to be obtained and vitals that have been documented are patient reported.    Cardiac Risk Factors include: advanced age (>29men, >68 women);Other (see comment), Risk factor comments: Cardiac arrest, OSA, Osteoporosis     Objective:    Today's Vitals   08/05/23 0900  Weight: 137 lb (62.1 kg)  Height: 5' 6.5" (1.689 m)   Body mass index is 21.78 kg/m.     08/05/2023    9:08 AM 08/18/2022    8:13 AM 07/29/2021    2:31 PM 01/30/2021   11:44 AM 01/21/2021    8:45 AM 03/19/2020    8:12 PM  Advanced Directives  Does Patient Have a Medical Advance Directive? Yes Yes Yes Yes Yes No  Type of Estate agent of [place];Living will Healthcare Power of [place];Living will Living will;Healthcare Power of [employer] Power of [place];Living will Healthcare Power of [place];Living will   Does patient want to make changes to medical advance directive? No - Patient declined No - Patient declined  No - Patient declined    Copy of Healthcare Power of Attorney in Chart? Yes - validated most recent copy scanned in chart (See row information) Yes - validated most recent copy scanned in chart (See row information) No - copy requested No - copy requested    Would patient like information on creating a medical advance directive?       No - Patient declined    Current Medications (verified) Outpatient Encounter Medications as of 08/05/2023  Medication Sig   acetaminophen (TYLENOL) 325 MG tablet Take 1-2 tablets (325-650 mg total) by mouth every 6 (six) hours as needed for mild pain (pain score 1-3 or temp > 100.5).   Ascorbic Acid (VITAMIN C) 500 MG CAPS Take 500 mg by mouth daily.   Cholecalciferol (VITAMIN D) 50 MCG (2000 UT) tablet Take 2,000 Units by mouth daily. Per patient taking 1,000 units daily   ibandronate (BONIVA) 150 MG tablet Take 1 tablet (150 mg total) by mouth every 30 (thirty) days. Take in the morning with a full glass of water, on an empty stomach, and do not take anything else by mouth or lie down for the next 30 min.   ibuprofen (ADVIL) 200 MG tablet Take 200 mg by mouth every 8 (eight) hours as needed for mild pain.   Magnesium 250 MG TABS Take 240 mg by mouth 2 (two) times daily. Per patient taking 120 mg twice a day   NEOMYCIN-POLYMYXIN-HYDROCORTISONE (CORTISPORIN) 1 % SOLN OTIC solution Place 3 drops into the right ear 4 (four) times daily.   pyridOXINE (VITAMIN B6) 100 MG tablet Take 100 mg by mouth daily.   No facility-administered encounter medications on file as of 08/05/2023.    Allergies (verified) Patient has no known allergies.   History: Past Medical History:  Diagnosis Date   AICD (automatic cardioverter/defibrillator) present    Arthritis of left hip  Myocardial infarction [facility])    Cardiac arrest 6/21   Osteoporosis    PONV (postoperative nausea and vomiting)    Seborrheic keratoses 12/30/2022   Sleep apnea    Past Surgical History:  Procedure Laterality Date   CHOLECYSTECTOMY     ICD IMPLANT N/A 03/21/2020   Procedure: ICD IMPLANT;  Surgeon: Marinus Maw, MD;  Location: [facility] INVASIVE CV LAB;  Service: Cardiovascular;  Laterality: N/A;   KNEE ARTHROSCOPY     x 2   LEFT HEART CATH AND CORONARY ANGIOGRAPHY N/A 03/21/2020   Procedure: LEFT HEART CATH AND CORONARY  ANGIOGRAPHY;  Surgeon: Lennette Bihari, MD;  Location: [facility] INVASIVE CV LAB;  Service: Cardiovascular;  Laterality: N/A;   SP CHOLECYSTOMY     TOTAL HIP ARTHROPLASTY Left 01/30/2021   Procedure: TOTAL HIP ARTHROPLASTY ANTERIOR APPROACH;  Surgeon: Samson Frederic, MD;  Location: [facility] ORS;  Service: Orthopedics;  Laterality: Left;   Family History  Problem Relation Age of Onset   Stroke Mother    Breast cancer Mother 86   Stroke Father    Colon cancer Sister 58   Colon polyps Brother    Cancer Brother    Colon polyps Brother    Esophageal cancer Neg Hx    Rectal cancer Neg Hx    Stomach cancer Neg Hx    Social History   Socioeconomic History   Marital status: Married    Spouse name: Molly Maduro   Number of children: 0   Years of education: Not on file   Highest education level: Not on file  Occupational History   Occupation: Retired  Tobacco Use   Smoking status: Never   Smokeless tobacco: Never  Vaping Use   Vaping status: Never Used  Substance and Sexual Activity   Alcohol use: Yes    Alcohol/week: 1.0 - 5.0 standard drink of alcohol    Types: 1 - 5 Glasses of wine per week    Comment: glass daily    Drug use: Never   Sexual activity: Not Currently  Other Topics Concern   Not on file  Social History Narrative   ** Merged History Encounter **    Lives with her husband. 1 cat name Lucy.   Social Determinants of Health   Financial Resource Strain: Low Risk  (08/05/2023)   Overall Financial Resource Strain (CARDIA)    Difficulty of Paying Living Expenses: Not hard at all  Food Insecurity: No Food Insecurity (08/05/2023)   Hunger Vital Sign    Worried About Running Out of Food in the Last Year: Never true    Ran Out of Food in the Last Year: Never true  Transportation Needs: No Transportation Needs (08/05/2023)   PRAPARE - Administrator, Civil Service (Medical): No    Lack of Transportation (Non-Medical): No  Physical Activity: Sufficiently Active (08/05/2023)    Exercise Vital Sign    Days of Exercise per Week: 5 days    Minutes of Exercise per Session: 60 min  Stress: No Stress Concern Present (08/05/2023)   [employer] of Occupational Health - Occupational Stress Questionnaire    Feeling of Stress : Not at all  Social Connections: Moderately Integrated (08/05/2023)   Social Connection and Isolation Panel [NHANES]    Frequency of Communication with Friends and Family: Three times a week    Frequency of Social Gatherings with Friends and Family: More than three times a week    Attends Religious Services: Never    Active Member of  Clubs or Organizations: Yes    Attends Banker Meetings: Never    Marital Status: Married    Tobacco Counseling Counseling given: Not Answered   Clinical Intake:  Pre-visit preparation completed: Yes  Pain : No/denies pain     BMI - recorded: 21.78 Nutritional Status: BMI of 19-24  Normal Nutritional Risks: None Diabetes: No  How often do you need to have someone help you when you read instructions, pamphlets, or other written materials from your doctor or pharmacy?: 1 - Never  Interpreter Needed?: No  Information entered by :: Lorilyn Laitinen, RMA   Activities of Daily Living    08/05/2023    9:32 AM 08/18/2022    8:14 AM  In your present state of health, do you have any difficulty performing the following activities:  Hearing? 0 0  Vision? 0 0  Difficulty concentrating or making decisions? 0 0  Walking or climbing stairs? 0 0  Dressing or bathing? 0 0  Doing errands, shopping? 0 0  Preparing Food and eating ? N N  Using the Toilet? N N  In the past six months, have you accidently leaked urine? N N  Do you have problems with loss of bowel control? N N  Managing your Medications? N N  Managing your Finances? N N  Housekeeping or managing your Housekeeping? N N    Patient Care Team: Avanell Shackleton, NP-C as PCP - General (Family Medicine) Marinus Maw, MD as PCP -  Cardiology (Cardiology) Patient, No Pcp Per (General Practice)  Indicate any recent Medical Services you may have received from other than [facility] providers in the past year (date may be approximate).     Assessment:   This is a routine wellness examination for Allison Cannon.  Hearing/Vision screen Hearing Screening - Comments:: Denies hearing difficulties   Vision Screening - Comments:: Denies any vision issues.   Goals Addressed               This Visit's Progress     Patient Stated (pt-stated)        To stay active as long as she can      Depression Screen    08/05/2023    9:15 AM 07/22/2023    1:00 PM 12/30/2022    9:44 AM 08/18/2022    8:13 AM 07/26/2020    2:05 PM 06/28/2020   10:57 AM 06/10/2016    1:40 PM  PHQ 2/9 Scores  PHQ - 2 Score 0 0 0 0 0 0 0  PHQ- 9 Score 0          Fall Risk    08/05/2023    9:09 AM 07/22/2023    1:00 PM 12/30/2022    9:44 AM 08/18/2022    8:14 AM 07/29/2021    2:29 PM  Fall Risk   Falls in the past year? 0 0 0 0 0  Number falls in past yr:  0 0 0 0  Injury with Fall?  0 0 0 0  Risk for fall due to :  No Fall Risks No Fall Risks No Fall Risks No Fall Risks  Follow up Falls evaluation completed;Falls prevention discussed Falls evaluation completed Falls evaluation completed Falls evaluation completed Falls evaluation completed    MEDICARE RISK AT HOME: Medicare Risk at Home Any stairs in or around the home?: Yes If so, are there any without handrails?: Yes Home free of loose throw rugs in walkways, pet beds, electrical cords, etc?: Yes Adequate lighting  in your home to reduce risk of falls?: Yes Life alert?: No Use of a cane, walker or w/c?: No Grab bars in the bathroom?: Yes Shower chair or bench in shower?: Yes Elevated toilet seat or a handicapped toilet?: Yes  TIMED UP AND GO:  Was the test performed?  No    Cognitive Function:        08/05/2023    9:11 AM  6CIT Screen  What Year? 0 points  What month? 0 points  What  time? 0 points  Count back from 20 0 points  Months in reverse 0 points  Repeat phrase 0 points  Total Score 0 points    Immunizations Immunization History  Administered Date(s) Administered   Influenza-Unspecified 06/27/2020, 07/25/2021, 07/15/2022   PFIZER(Purple Top)SARS-COV-2 Vaccination 01/09/2020, 01/30/2020, 09/04/2020   Pfizer Covid-19 Vaccine Bivalent Booster 1yrs & up 02/20/2021, 08/01/2021, 02/19/2022, 07/31/2022   Pneumococcal Conjugate-13 08/03/2020   Pneumococcal Polysaccharide-23 08/03/2020   Rsv, Bivalent, Protein Subunit Rsvpref,pf Verdis Frederickson) 08/11/2022   Zoster Recombinant(Shingrix) 08/01/2018, 10/17/2018    TDAP status: Due, Education has been provided regarding the importance of this vaccine. Advised may receive this vaccine at local pharmacy or Health Dept. Aware to provide a copy of the vaccination record if obtained from local pharmacy or Health Dept. Verbalized acceptance and understanding.  Flu Vaccine status: Due, Education has been provided regarding the importance of this vaccine. Advised may receive this vaccine at local pharmacy or Health Dept. Aware to provide a copy of the vaccination record if obtained from local pharmacy or Health Dept. Verbalized acceptance and understanding.  Pneumococcal vaccine status: Up to date  Covid-19 vaccine status: Information provided on how to obtain vaccines.   Qualifies for Shingles Vaccine? Yes   Zostavax completed Yes   Shingrix Completed?: Yes  Screening Tests Health Maintenance  Topic Date Due   DTaP/Tdap/Td (1 - Tdap) Never done   COVID-19 Vaccine (8 - 2023-24 season) 08/07/2023 (Originally 06/20/2023)   INFLUENZA VACCINE  01/17/2024 (Originally 05/20/2023)   Medicare Annual Wellness (AWV)  08/04/2024   MAMMOGRAM  11/20/2024   Colonoscopy  12/15/2027   Pneumonia Vaccine 40+ Years old  Completed   DEXA SCAN  Completed   Hepatitis C Screening  Completed   Zoster Vaccines- Shingrix  Completed   HPV VACCINES   Aged Out    Health Maintenance  Health Maintenance Due  Topic Date Due   DTaP/Tdap/Td (1 - Tdap) Never done    Colorectal cancer screening: Type of screening: Colonoscopy. Completed 12/14/2022. Repeat every 5 years  Mammogram status: Completed 11/20/2022. Repeat every year  Bone Density status: Completed 11/20/2022. Results reflect: Bone density results: OSTEOPOROSIS. Repeat every 2 years.  Lung Cancer Screening: (Low Dose CT Chest recommended if Age 31-80 years, 20 pack-year currently smoking OR have quit w/in 15years.) does not qualify.   Lung Cancer Screening Referral: N/A  Additional Screening:  Hepatitis C Screening: does qualify; Completed 06/28/2020  Vision Screening: Recommended annual ophthalmology exams for early detection of glaucoma and other disorders of the eye. Is the patient up to date with their annual eye exam?  Yes  Who is the provider or what is the name of the office in which the patient attends annual eye exams? [facility] If pt is not established with a provider, would they like to be referred to a provider to establish care? No .   Dental Screening: Recommended annual dental exams for proper oral hygiene   Community Resource Referral / Chronic Care Management: CRR required  this visit?  No   CCM required this visit?  No     Plan:     I have personally reviewed and noted the following in the patient's chart:   Medical and social history Use of alcohol, tobacco or illicit drugs  Current medications and supplements including opioid prescriptions. Patient is not currently taking opioid prescriptions. Functional ability and status Nutritional status Physical activity Advanced directives List of other physicians Hospitalizations, surgeries, and ER visits in previous 12 months Vitals Screenings to include cognitive, depression, and falls Referrals and appointments  In addition, I have reviewed and discussed with patient certain preventive  protocols, quality metrics, and best practice recommendations. A written personalized care plan for preventive services as well as general preventive health recommendations were provided to patient.     Allison Cannon, CMA   08/05/2023   After Visit Summary: (MyChart) Due to this being a telephonic visit, the after visit summary with patients personalized plan was offered to patient via MyChart   Nurse Notes: Patient is due for a Tdap vaccine.  She is up to date on all other health maintenance.  Patient had no other concerns to address today.

## 2023-08-24 ENCOUNTER — Ambulatory Visit: Payer: Medicare Other | Admitting: Family Medicine

## 2023-08-24 ENCOUNTER — Ambulatory Visit: Payer: Medicare Other

## 2023-08-24 ENCOUNTER — Encounter: Payer: Self-pay | Admitting: Family Medicine

## 2023-08-24 VITALS — BP 98/66 | HR 70 | Temp 97.6°F | Ht 66.5 in | Wt 138.0 lb

## 2023-08-24 DIAGNOSIS — M816 Localized osteoporosis [Lequesne]: Secondary | ICD-10-CM

## 2023-08-24 DIAGNOSIS — H9201 Otalgia, right ear: Secondary | ICD-10-CM | POA: Insufficient documentation

## 2023-08-24 DIAGNOSIS — E875 Hyperkalemia: Secondary | ICD-10-CM

## 2023-08-24 DIAGNOSIS — Z0001 Encounter for general adult medical examination with abnormal findings: Secondary | ICD-10-CM | POA: Diagnosis not present

## 2023-08-24 DIAGNOSIS — H6122 Impacted cerumen, left ear: Secondary | ICD-10-CM

## 2023-08-24 DIAGNOSIS — Z8674 Personal history of sudden cardiac arrest: Secondary | ICD-10-CM

## 2023-08-24 DIAGNOSIS — E2839 Other primary ovarian failure: Secondary | ICD-10-CM

## 2023-08-24 DIAGNOSIS — M2669 Other specified disorders of temporomandibular joint: Secondary | ICD-10-CM | POA: Diagnosis not present

## 2023-08-24 DIAGNOSIS — Z7185 Encounter for immunization safety counseling: Secondary | ICD-10-CM | POA: Diagnosis not present

## 2023-08-24 DIAGNOSIS — Z13220 Encounter for screening for lipoid disorders: Secondary | ICD-10-CM | POA: Diagnosis not present

## 2023-08-24 DIAGNOSIS — H6123 Impacted cerumen, bilateral: Secondary | ICD-10-CM | POA: Insufficient documentation

## 2023-08-24 DIAGNOSIS — E612 Magnesium deficiency: Secondary | ICD-10-CM

## 2023-08-24 LAB — CBC WITH DIFFERENTIAL/PLATELET
Basophils Absolute: 0.1 10*3/uL (ref 0.0–0.1)
Basophils Relative: 2.2 % (ref 0.0–3.0)
Eosinophils Absolute: 0.1 10*3/uL (ref 0.0–0.7)
Eosinophils Relative: 1.4 % (ref 0.0–5.0)
HCT: 43.3 % (ref 36.0–46.0)
Hemoglobin: 14 g/dL (ref 12.0–15.0)
Lymphocytes Relative: 29.2 % (ref 12.0–46.0)
Lymphs Abs: 1.9 10*3/uL (ref 0.7–4.0)
MCHC: 32.4 g/dL (ref 30.0–36.0)
MCV: 87.6 fL (ref 78.0–100.0)
Monocytes Absolute: 0.5 10*3/uL (ref 0.1–1.0)
Monocytes Relative: 6.9 % (ref 3.0–12.0)
Neutro Abs: 4 10*3/uL (ref 1.4–7.7)
Neutrophils Relative %: 60.3 % (ref 43.0–77.0)
Platelets: 238 10*3/uL (ref 150.0–400.0)
RBC: 4.94 Mil/uL (ref 3.87–5.11)
RDW: 13.6 % (ref 11.5–15.5)
WBC: 6.7 10*3/uL (ref 4.0–10.5)

## 2023-08-24 LAB — COMPREHENSIVE METABOLIC PANEL
ALT: 18 U/L (ref 0–35)
AST: 20 U/L (ref 0–37)
Albumin: 4.5 g/dL (ref 3.5–5.2)
Alkaline Phosphatase: 77 U/L (ref 39–117)
BUN: 13 mg/dL (ref 6–23)
CO2: 29 meq/L (ref 19–32)
Calcium: 9.8 mg/dL (ref 8.4–10.5)
Chloride: 103 meq/L (ref 96–112)
Creatinine, Ser: 0.72 mg/dL (ref 0.40–1.20)
GFR: 86.13 mL/min (ref >60.00)
Glucose, Bld: 89 mg/dL (ref 70–99)
Potassium: 5.4 meq/L — ABNORMAL HIGH (ref 3.5–5.1)
Sodium: 139 meq/L (ref 135–145)
Total Bilirubin: 0.6 mg/dL (ref 0.2–1.2)
Total Protein: 7.2 g/dL (ref 6.0–8.3)

## 2023-08-24 LAB — LIPID PANEL
Cholesterol: 213 mg/dL — ABNORMAL HIGH (ref 0–200)
HDL: 112.7 mg/dL (ref >39.00)
LDL Cholesterol: 88 mg/dL (ref 0–99)
NonHDL: 99.9
Total CHOL/HDL Ratio: 2
Triglycerides: 59 mg/dL (ref 0.0–149.0)
VLDL: 11.8 mg/dL (ref 0.0–40.0)

## 2023-08-24 LAB — MAGNESIUM: Magnesium: 1.9 mg/dL (ref 1.5–2.5)

## 2023-08-24 LAB — VITAMIN D 25 HYDROXY (VIT D DEFICIENCY, FRACTURES): VITD: 42.13 ng/mL (ref 30.00–100.00)

## 2023-08-24 NOTE — Assessment & Plan Note (Signed)
Recheck in 2-3 days.  °

## 2023-08-24 NOTE — Assessment & Plan Note (Signed)
Followed by cardiology. Feels well.

## 2023-08-24 NOTE — Assessment & Plan Note (Signed)
Non tender and no pain. Monitor for now.

## 2023-08-24 NOTE — Progress Notes (Signed)
Complete physical exam  Patient: Allison Cannon   DOB: 12-10-1955   67 y.o. Female  MRN: [id number]  Subjective:    Chief Complaint  Patient presents with   Annual Exam    fasting   She is here for a complete physical exam and to follow up on right ear and jaw pain.  Overall feeling better but continues having right ear ache, knot on right TMJ without pain. Does not recall the area being as large in the past.   Started on Boniva April 2024.  This is her first osteoporosis medication.   Taking OTC supplements including magnesium 240 mg, Women's One A Day over 50, Vitamin D3  Flu shot in September 2024 at [facility]. Covid booster 2 weeks ago.  [facility] on [address]    She saw dermatology specialists.       Health Maintenance  Topic Date Due   DTaP/Tdap/Td vaccine (1 - Tdap) Never done   COVID-19 Vaccine (8 - 2023-24 season) 09/09/2023*   Flu Shot  01/17/2024*   Medicare Annual Wellness Visit  08/04/2024   Mammogram  11/20/2024   Colon Cancer Screening  12/15/2027   Pneumonia Vaccine  Completed   DEXA scan (bone density measurement)  Completed   Hepatitis C Screening  Completed   Zoster (Shingles) Vaccine  Completed   HPV Vaccine  Aged Out  *Topic was postponed. The date shown is not the original due date.    Wears seatbelt always, uses sunscreen, smoke detectors in home and functioning, does not text while driving, feels safe in home environment.  Depression screening:    08/05/2023    9:15 AM 07/22/2023    1:00 PM 12/30/2022    9:44 AM  Depression screen PHQ 2/9  Decreased Interest 0 0 0  Down, Depressed, Hopeless 0 0 0  PHQ - 2 Score 0 0 0  Altered sleeping 0    Tired, decreased energy 0    Change in appetite 0    Trouble concentrating 0    Moving slowly or fidgety/restless 0    Suicidal thoughts 0    PHQ-9 Score 0    Difficult doing work/chores Not difficult at all     Anxiety Screening:     No data to display          Vision:no  issues with vision and Dental: No current dental problems and Receives regular dental care  Patient Active Problem List   Diagnosis Date Noted   Crepitus of right TMJ on opening of jaw 08/24/2023   Bilateral impacted cerumen 08/24/2023   Encounter for general adult medical examination with abnormal findings 08/24/2023   History of sudden cardiac arrest 08/24/2023   Hyperkalemia 08/24/2023   Right ear pain 08/24/2023   Angioma of skin 12/30/2022   Elevated alkaline phosphatase level 08/18/2022   OSA (obstructive sleep apnea) 05/21/2022   Localized osteoporosis without current pathological fracture 07/28/2020   Estrogen deficiency 07/28/2020   Osteoporosis 07/28/2020   ICD (implantable cardioverter-defibrillator) in place 06/27/2020   Cardiac arrest [facility])    LFT elevation    Primary osteoarthritis of left hip 11/07/2019   Osteoarthritis of left hip 11/07/2019   Past Medical History:  Diagnosis Date   AICD (automatic cardioverter/defibrillator) present    Arthritis of left hip    Myocardial infarction [facility])    Cardiac arrest 6/21   Osteoporosis    PONV (postoperative nausea and vomiting)    Seborrheic keratoses 12/30/2022   Sleep apnea  Past Surgical History:  Procedure Laterality Date   CHOLECYSTECTOMY     ICD IMPLANT N/A 03/21/2020   Procedure: ICD IMPLANT;  Surgeon: Marinus Maw, MD;  Location: [facility] INVASIVE CV LAB;  Service: Cardiovascular;  Laterality: N/A;   KNEE ARTHROSCOPY     x 2   LEFT HEART CATH AND CORONARY ANGIOGRAPHY N/A 03/21/2020   Procedure: LEFT HEART CATH AND CORONARY ANGIOGRAPHY;  Surgeon: Lennette Bihari, MD;  Location: [facility] INVASIVE CV LAB;  Service: Cardiovascular;  Laterality: N/A;   SP CHOLECYSTOMY     TOTAL HIP ARTHROPLASTY Left 01/30/2021   Procedure: TOTAL HIP ARTHROPLASTY ANTERIOR APPROACH;  Surgeon: Samson Frederic, MD;  Location: [facility] ORS;  Service: Orthopedics;  Laterality: Left;   Social History   Tobacco Use   Smoking status: Never    Smokeless tobacco: Never  Vaping Use   Vaping status: Never Used  Substance Use Topics   Alcohol use: Yes    Alcohol/week: 1.0 - 5.0 standard drink of alcohol    Types: 1 - 5 Glasses of wine per week    Comment: glass daily    Drug use: Never      Patient Care Team: Avanell Shackleton, NP-C as PCP - General (Family Medicine) Marinus Maw, MD as PCP - Cardiology (Cardiology) Patient, No Pcp Per (General Practice)   Outpatient Medications Prior to Visit  Medication Sig   acetaminophen (TYLENOL) 325 MG tablet Take 1-2 tablets (325-650 mg total) by mouth every 6 (six) hours as needed for mild pain (pain score 1-3 or temp > 100.5).   Ascorbic Acid (VITAMIN C) 500 MG CAPS Take 500 mg by mouth daily.   Cholecalciferol (VITAMIN D) 50 MCG (2000 UT) tablet Take 2,000 Units by mouth daily. Per patient taking 1,000 units daily   ibandronate (BONIVA) 150 MG tablet Take 1 tablet (150 mg total) by mouth every 30 (thirty) days. Take in the morning with a full glass of water, on an empty stomach, and do not take anything else by mouth or lie down for the next 30 min.   ibuprofen (ADVIL) 200 MG tablet Take 200 mg by mouth every 8 (eight) hours as needed for mild pain.   Magnesium 250 MG TABS Take 240 mg by mouth 2 (two) times daily. Per patient taking 120 mg twice a day   NEOMYCIN-POLYMYXIN-HYDROCORTISONE (CORTISPORIN) 1 % SOLN OTIC solution Place 3 drops into the right ear 4 (four) times daily.   pyridOXINE (VITAMIN B6) 100 MG tablet Take 100 mg by mouth daily.   No facility-administered medications prior to visit.    Review of Systems  Constitutional:  Negative for chills, fever, malaise/fatigue and weight loss.  HENT:  Positive for ear pain and hearing loss. Negative for congestion, sinus pain and sore throat.   Eyes:  Negative for blurred vision, double vision and pain.  Respiratory:  Negative for cough, shortness of breath and wheezing.   Cardiovascular:  Negative for chest pain, palpitations  and leg swelling.  Gastrointestinal:  Negative for abdominal pain, constipation, diarrhea, nausea and vomiting.  Genitourinary:  Negative for dysuria, frequency and urgency.  Musculoskeletal:  Negative for back pain, falls, joint pain and myalgias.  Skin:  Negative for rash.  Neurological:  Negative for dizziness, tingling, focal weakness and headaches.  Psychiatric/Behavioral:  Negative for depression, memory loss and suicidal ideas. The patient is not nervous/anxious.        Objective:    BP 98/66 (BP Location: Left Arm, Patient Position: Sitting, Cuff  Size: Large)   Pulse 70   Temp 97.6 F (36.4 C) (Temporal)   Ht 5' 6.5" (1.689 m)   Wt 138 lb (62.6 kg)   SpO2 99%   BMI 21.94 kg/m  BP Readings from Last 3 Encounters:  08/24/23 98/66  07/22/23 116/72  12/30/22 122/76   Wt Readings from Last 3 Encounters:  08/24/23 138 lb (62.6 kg)  08/05/23 137 lb (62.1 kg)  07/22/23 137 lb (62.1 kg)    Physical Exam Constitutional:      General: She is not in acute distress.    Appearance: She is not ill-appearing.  HENT:     Head:     Jaw: No tenderness or pain on movement.     Comments: Right TMJ with crepitus Firm enlarged area on right TMJ, non tender     Right Ear: Tenderness present. There is impacted cerumen.     Left Ear: Tympanic membrane normal. Decreased hearing noted. There is impacted cerumen.     Ears:     Comments: Cerumen impaction bilaterally.  Left ear canal and TM normal after ear lavage by CMA  Right ear canal with tenderness to speculum and ear lavage not attempted. Unable to visualize left TM.  No mastoid TTP. No lymphadenopathy     Nose: Nose normal.     Mouth/Throat:     Mouth: Mucous membranes are moist.     Pharynx: Oropharynx is clear.  Eyes:     Extraocular Movements: Extraocular movements intact.     Conjunctiva/sclera: Conjunctivae normal.     Pupils: Pupils are equal, round, and reactive to light.  Neck:     Thyroid: No thyroid mass,  thyromegaly or thyroid tenderness.  Cardiovascular:     Rate and Rhythm: Normal rate and regular rhythm.     Pulses: Normal pulses.     Heart sounds: Normal heart sounds.  Pulmonary:     Effort: Pulmonary effort is normal.     Breath sounds: Normal breath sounds.  Abdominal:     General: Bowel sounds are normal.     Palpations: Abdomen is soft.     Tenderness: There is no abdominal tenderness. There is no right CVA tenderness, left CVA tenderness, guarding or rebound.  Musculoskeletal:        General: Normal range of motion.     Cervical back: Normal range of motion and neck supple. No tenderness.     Right lower leg: No edema.     Left lower leg: No edema.  Lymphadenopathy:     Cervical: No cervical adenopathy.  Skin:    General: Skin is warm and dry.     Findings: No lesion or rash.  Neurological:     General: No focal deficit present.     Mental Status: She is alert and oriented to person, place, and time.     Cranial Nerves: No cranial nerve deficit.     Sensory: No sensory deficit.     Motor: No weakness.     Gait: Gait normal.  Psychiatric:        Mood and Affect: Mood normal.        Behavior: Behavior normal.        Thought Content: Thought content normal.      Results for orders placed or performed in visit on 08/24/23  Magnesium  Result Value Ref Range   Magnesium 1.9 1.5 - 2.5 mg/dL  VITAMIN D 25 Hydroxy (Vit-D Deficiency, Fractures)  Result Value Ref Range  VITD 42.13 30.00 - 100.00 ng/mL  Lipid panel  Result Value Ref Range   Cholesterol 213 (H) 0 - 200 mg/dL   Triglycerides 18.8 0.0 - 149.0 mg/dL   HDL 416.60 >63.01 mg/dL   VLDL 60.1 0.0 - 09.3 mg/dL   LDL Cholesterol 88 0 - 99 mg/dL   Total CHOL/HDL Ratio 2    NonHDL 99.90   Comprehensive metabolic panel  Result Value Ref Range   Sodium 139 135 - 145 mEq/L   Potassium 5.4 No hemolysis seen (H) 3.5 - 5.1 mEq/L   Chloride 103 96 - 112 mEq/L   CO2 29 19 - 32 mEq/L   Glucose, Bld 89 70 - 99 mg/dL    BUN 13 6 - 23 mg/dL   Creatinine, Ser 2.35 0.40 - 1.20 mg/dL   Total Bilirubin 0.6 0.2 - 1.2 mg/dL   Alkaline Phosphatase 77 39 - 117 U/L   AST 20 0 - 37 U/L   ALT 18 0 - 35 U/L   Total Protein 7.2 6.0 - 8.3 g/dL   Albumin 4.5 3.5 - 5.2 g/dL   GFR 57.32 >20.25 mL/min   Calcium 9.8 8.4 - 10.5 mg/dL  CBC with Differential/Platelet  Result Value Ref Range   WBC 6.7 4.0 - 10.5 K/uL   RBC 4.94 3.87 - 5.11 Mil/uL   Hemoglobin 14.0 12.0 - 15.0 g/dL   HCT 42.7 06.2 - 37.6 %   MCV 87.6 78.0 - 100.0 fl   MCHC 32.4 30.0 - 36.0 g/dL   RDW 28.3 15.1 - 76.1 %   Platelets 238.0 150.0 - 400.0 K/uL   Neutrophils Relative % 60.3 43.0 - 77.0 %   Lymphocytes Relative 29.2 12.0 - 46.0 %   Monocytes Relative 6.9 3.0 - 12.0 %   Eosinophils Relative 1.4 0.0 - 5.0 %   Basophils Relative 2.2 0.0 - 3.0 %   Neutro Abs 4.0 1.4 - 7.7 K/uL   Lymphs Abs 1.9 0.7 - 4.0 K/uL   Monocytes Absolute 0.5 0.1 - 1.0 K/uL   Eosinophils Absolute 0.1 0.0 - 0.7 K/uL   Basophils Absolute 0.1 0.0 - 0.1 K/uL      Assessment & Plan:    Routine Health Maintenance and Physical Exam  Problem List Items Addressed This Visit       Cardiovascular and Mediastinum   History of sudden cardiac arrest    Followed by cardiology. Feels well.       Relevant Orders   CBC with Differential/Platelet (Completed)   Comprehensive metabolic panel (Completed)   Lipid panel (Completed)   Magnesium (Completed)     Nervous and Auditory   Bilateral impacted cerumen    Left ear lavage performed and she tolerated this well. Normal appearing TM without improvement of hearing on left. Referral to ENT Right ear lavage not done due to patient discomfort. Unable to visualize TM. Treated with antibiotic therapy at last visit. Referral to ENT        Musculoskeletal and Integument   Localized osteoporosis without current pathological fracture    On Boniva without concerns. Due for repeat DEXA in 2026 Continue vitamin D3, calcium and  weight bearing exercises.       Relevant Orders   Comprehensive metabolic panel (Completed)   VITAMIN D 25 Hydroxy (Vit-D Deficiency, Fractures) (Completed)   DG Facial Bones Complete (Completed)     Other   Crepitus of right TMJ on opening of jaw    Non tender and no pain. Monitor for now.  Relevant Orders   DG Facial Bones Complete (Completed)   Encounter for general adult medical examination with abnormal findings - Primary    Preventive health care reviewed.  Appt with gynecologist. Colonoscopy UTD. Sees Dermatologist. Counseling on healthy lifestyle including diet and exercise.  Recommend regular dental and eye exams.  Immunizations reviewed.  Discussed safety.       Estrogen deficiency   Relevant Orders   VITAMIN D 25 Hydroxy (Vit-D Deficiency, Fractures) (Completed)   Hyperkalemia    Recheck in 2-3 days.       Relevant Orders   Basic metabolic panel   Right ear pain   Relevant Orders   Ambulatory referral to ENT   DG Facial Bones Complete (Completed)   Other Visit Diagnoses     Immunization counseling       Magnesium deficiency       Relevant Orders   Magnesium (Completed)   Screening for lipid disorders       Relevant Orders   Lipid panel (Completed)       Return for pending labs.     Hetty Blend, NP-C

## 2023-08-24 NOTE — Patient Instructions (Addendum)
Please ask your pharmacy about your vaccines and let me know dates.   Last Tdap?  Last pneumonia vaccines?   I will be in touch with your X ray results and recommendations.

## 2023-08-24 NOTE — Assessment & Plan Note (Signed)
On Boniva without concerns. Due for repeat DEXA in 2026 Continue vitamin D3, calcium and weight bearing exercises.

## 2023-08-24 NOTE — Assessment & Plan Note (Signed)
Left ear lavage performed and she tolerated this well. Normal appearing TM without improvement of hearing on left. Referral to ENT Right ear lavage not done due to patient discomfort. Unable to visualize TM. Treated with antibiotic therapy at last visit. Referral to ENT

## 2023-08-24 NOTE — Assessment & Plan Note (Signed)
Preventive health care reviewed.  Appt with gynecologist. Colonoscopy UTD. Sees Dermatologist. Counseling on healthy lifestyle including diet and exercise.  Recommend regular dental and eye exams.  Immunizations reviewed.  Discussed safety.

## 2023-08-24 NOTE — Progress Notes (Signed)
Please reach out to her regarding the following:  "Your potassium level is elevated and I would like to recheck this later this week. Please come in for a lab visit Thursday or Friday and you do not have to fast.  Your total cholesterol is high because your good cholesterol (the HDL) is high and that is ok. Your bad cholesterol (LDL) is in goal at 88 unless your cardiologist prefers for it to be lower. Please discuss at your follow up cardiologist visit".

## 2023-08-26 ENCOUNTER — Other Ambulatory Visit (INDEPENDENT_AMBULATORY_CARE_PROVIDER_SITE_OTHER): Payer: Medicare Other

## 2023-08-26 DIAGNOSIS — E875 Hyperkalemia: Secondary | ICD-10-CM | POA: Diagnosis not present

## 2023-08-26 LAB — BASIC METABOLIC PANEL
BUN: 14 mg/dL (ref 6–23)
CO2: 27 meq/L (ref 19–32)
Calcium: 9.6 mg/dL (ref 8.4–10.5)
Chloride: 101 meq/L (ref 96–112)
Creatinine, Ser: 0.71 mg/dL (ref 0.40–1.20)
GFR: 87.59 mL/min (ref >60.00)
Glucose, Bld: 103 mg/dL — ABNORMAL HIGH (ref 70–99)
Potassium: 4.8 meq/L (ref 3.5–5.1)
Sodium: 135 meq/L (ref 135–145)
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
## 2023-09-13 ENCOUNTER — Encounter: Payer: Self-pay | Admitting: Physician Assistant

## 2023-09-13 ENCOUNTER — Ambulatory Visit: Payer: Medicare Other | Attending: Physician Assistant | Admitting: Physician Assistant

## 2023-09-13 VITALS — BP 108/66 | HR 69 | Ht 67.0 in | Wt 139.0 lb

## 2023-09-13 DIAGNOSIS — Z95810 Presence of automatic (implantable) cardiac defibrillator: Secondary | ICD-10-CM

## 2023-09-13 DIAGNOSIS — I469 Cardiac arrest, cause unspecified: Secondary | ICD-10-CM | POA: Diagnosis present

## 2023-09-13 LAB — CUP PACEART INCLINIC DEVICE CHECK
Battery Remaining Longevity: 86 mo
Brady Statistic RV Percent Paced: 0 %
Date Time Interrogation Session: 20241125123923
HighPow Impedance: 72 Ohm
Implantable Lead Connection Status: 753985
Implantable Lead Implant Date: 20210603
Implantable Lead Location: 753860
Implantable Pulse Generator Implant Date: 20210603
Lead Channel Impedance Value: 425 Ohm
Lead Channel Pacing Threshold Amplitude: 0.75 V
Lead Channel Pacing Threshold Amplitude: 0.75 V
Lead Channel Pacing Threshold Pulse Width: 0.5 ms
Lead Channel Pacing Threshold Pulse Width: 0.5 ms
Lead Channel Sensing Intrinsic Amplitude: 12 mV
Lead Channel Setting Pacing Amplitude: 2.5 V
Lead Channel Setting Pacing Pulse Width: 0.5 ms
Lead Channel Setting Sensing Sensitivity: 0.5 mV
Pulse Gen Serial Number: 111007674

## 2023-09-13 NOTE — Patient Instructions (Signed)
Medication Instructions:  Your physician recommends that you continue on your current medications as directed. Please refer to the Current Medication list given to you today.  *If you need a refill on your cardiac medications before your next appointment, please call your pharmacy*   Lab Work: None ordered  If you have labs (blood work) drawn today and your tests are completely normal, you will receive your results only by: MyChart Message (if you have MyChart) OR A paper copy in the mail If you have any lab test that is abnormal or we need to change your treatment, we will call you to review the results.   Testing/Procedures: None ordered   Follow-Up: At [facility], you and your health needs are our priority.  As part of our continuing mission to provide you with exceptional heart care, we have created designated Provider Care Teams.  These Care Teams include your primary Cardiologist (physician) and Advanced Practice Providers (APPs -  Physician Assistants and Nurse Practitioners) who all work together to provide you with the care you need, when you need it.  We recommend signing up for the patient portal called "MyChart".  Sign up information is provided on this After Visit Summary.  MyChart is used to connect with patients for Virtual Visits (Telemedicine).  Patients are able to view lab/test results, encounter notes, upcoming appointments, etc.  Non-urgent messages can be sent to your provider as well.   To learn more about what you can do with MyChart, go to [email or web address].    Your next appointment:   12 month(s)  Provider:   Lewayne Bunting, MD or Francis Dowse, PA-C    Other Instructions

## 2023-09-14 ENCOUNTER — Ambulatory Visit (INDEPENDENT_AMBULATORY_CARE_PROVIDER_SITE_OTHER): Payer: Medicare Other | Admitting: Audiology

## 2023-09-14 ENCOUNTER — Encounter (INDEPENDENT_AMBULATORY_CARE_PROVIDER_SITE_OTHER): Payer: Self-pay

## 2023-09-14 ENCOUNTER — Ambulatory Visit (INDEPENDENT_AMBULATORY_CARE_PROVIDER_SITE_OTHER): Payer: Medicare Other | Admitting: Otolaryngology

## 2023-09-14 VITALS — Ht 67.0 in | Wt 139.0 lb

## 2023-09-14 DIAGNOSIS — H9201 Otalgia, right ear: Secondary | ICD-10-CM

## 2023-09-14 DIAGNOSIS — D333 Benign neoplasm of cranial nerves: Secondary | ICD-10-CM | POA: Diagnosis not present

## 2023-09-14 DIAGNOSIS — H903 Sensorineural hearing loss, bilateral: Secondary | ICD-10-CM

## 2023-09-14 DIAGNOSIS — H6123 Impacted cerumen, bilateral: Secondary | ICD-10-CM | POA: Diagnosis not present

## 2023-09-14 DIAGNOSIS — M26623 Arthralgia of bilateral temporomandibular joint: Secondary | ICD-10-CM | POA: Diagnosis not present

## 2023-09-14 MED ORDER — CIPROFLOXACIN-DEXAMETHASONE 0.3-0.1 % OT SUSP: 4.0000 [drp] | Freq: Two times a day (BID) | OTIC | 1 refills | Status: AC

## 2023-09-14 MED ORDER — IBUPROFEN 200 MG PO TABS: 400.0000 mg | ORAL_TABLET | Freq: Three times a day (TID) | ORAL | 0 refills | Status: AC | PRN

## 2023-09-14 NOTE — Patient Instructions (Addendum)
Take ibuprofen 400mg  two or 3 times per day Take ciprodex drops 4 drops twice daily for 10 days Warm compresses 3 times per day Stop gum chewing, hard foods. Soft foods x3 weeks 1 month follow up   I have ordered an imaging study for you to complete prior to your next visit. Please call [facility] Scheduling at [phone] to schedule your imaging if you have not received a call within 24 hours. If you are unable to complete your imaging study prior to your next scheduled visit please call our office to let us know.

## 2023-09-14 NOTE — Progress Notes (Signed)
Dear Dr. Suezanne Jacquet, Here is my assessment for our mutual patient, Allison Cannon. Thank you for allowing me the opportunity to care for your patient. Please do not hesitate to contact me should you have any other questions. Sincerely, Dr. Jovita Kussmaul  Otolaryngology Clinic Note Referring provider: Dr. Suezanne Jacquet HPI:  Allison Cannon is a 67 y.o. female kindly referred by Dr. Suezanne Jacquet for evaluation of right ear pain and headache.  Patient reports: close to 6 weeks ago, had some headache (points to temporalis) and right ear ache. She saw her PCP who prescribed cortisporin for two weeks and then stopped it. It seemed to help her discomfort, but did not resolve completely. Leveled off. She then saw her PCP for it for follow up about 3 weeks ago, wax cleaned on left, still ache on right. No more headaches around temporalis area. There is still a dull ache but it is intermittent. No antecedent event including URI, no jaw or dental pain, ear itching, no fevers/chills. Does have TMJ. She does report she eats a fair amount of nuts, she also reports she has been chewing more gum after meals.  Hears ok out of right ear, but left with some hearing loss. Of note, she reports that prior to her ICD placement in 2021, she had full body MRI which showed a left CPA angle mass. She has never seen an ENT for this. No prior audiograms  Patient denies: fullness, vertigo, imbalance, drainage, tinnitus, dizziness, facial numbness Patient additionally denies: deep pain in ear canal, eustachian tube symptoms such as popping, crackling, sensitivity to pressure changes. Patient also denies barotrauma, vestibular suppressant use, ototoxic medication use Prior ear surgery: no  H&N Surgery: no Personal or FHx of bleeding dz or anesthesia difficulty: no   GLP-1: no AP/AC: no  Tobacco: no. Occupation: CFO of a Company. Lives in [place], [place]  PMHx: Cardiac arrest (s/p ICD 3.5 years ago), OSA, Osteoporosis  Independent Review of  Additional Tests or Records:  Referral notes reviewed:  07/22/2023: "States she was waking up with headaches last week. Also having headaches during the day for the past couple of days. Headache on the right side near her ear. Also c/o right ear pain and maxillary sinus tenderness (improving). " 08/24/2023: "Left ear lavage performed and she tolerated this well. Normal appearing TM without improvement of hearing on left. Referral to ENT. Right ear lavage not done due to patient discomfort. Unable to visualize TM. Treated with antibiotic therapy at last visit. Referral to ENT"  MRI 2021: LEFT CPA angle mass, 1 x 1.6 x 0.9 cm; no mastoid effusion. No other lesions noted   09/14/2023 Audiogram was independently reviewed and interpreted by me and it reveals Right ear: normal downsloping to mod SNHL; 88% word interpretation at 70dB; type A tympanogram Left ear: normal downsloping to mod-severe/sever SNHL - asymmetric; 72% word interpretation at 80dB; type A tympanogram    SNHL= Sensorineural hearing loss   PMH/Meds/All/SocHx/FamHx/ROS:   Past Medical History:  Diagnosis Date   AICD (automatic cardioverter/defibrillator) present    Arthritis of left hip    Myocardial infarction [facility])    Cardiac arrest 6/21   Osteoporosis    PONV (postoperative nausea and vomiting)    Seborrheic keratoses 12/30/2022   Sleep apnea      Past Surgical History:  Procedure Laterality Date   CHOLECYSTECTOMY     ICD IMPLANT N/A 03/21/2020   Procedure: ICD IMPLANT;  Surgeon: Marinus Maw, MD;  Location: [facility] INVASIVE CV LAB;  Service:  Cardiovascular;  Laterality: N/A;   KNEE ARTHROSCOPY     x 2   LEFT HEART CATH AND CORONARY ANGIOGRAPHY N/A 03/21/2020   Procedure: LEFT HEART CATH AND CORONARY ANGIOGRAPHY;  Surgeon: Lennette Bihari, MD;  Location: [facility] INVASIVE CV LAB;  Service: Cardiovascular;  Laterality: N/A;   SP CHOLECYSTOMY     TOTAL HIP ARTHROPLASTY Left 01/30/2021   Procedure: TOTAL HIP ARTHROPLASTY  ANTERIOR APPROACH;  Surgeon: Samson Frederic, MD;  Location: [facility] OR[facility];  Service: Orthopedics;  Laterality: Left;    Family History  Problem Relation Age of Onset   Stroke Mother    Breast cancer Mother 52   Stroke Father    Colon cancer Sister 77   Colon polyps Brother    Cancer Brother    Colon polyps Brother    Esophageal cancer Neg Hx    Rectal cancer Neg Hx    Stomach cancer Neg Hx      Social Connections: Moderately Integrated (08/05/2023)   Social Connection and Isolation Panel [NHANES]    Frequency of Communication with Friends and Family: Three times a week    Frequency of Social Gatherings with Friends and Family: More than three times a week    Attends Religious Services: Never    Database administrator or Organizations: Yes    Attends Banker Meetings: Never    Marital Status: Married      Current Outpatient Medications:    acetaminophen (TYLENOL) 325 MG tablet, Take 1-2 tablets (325-650 mg total) by mouth every 6 (six) hours as needed for mild pain (pain score 1-3 or temp > 100.5)., Disp: 100 tablet, Rfl: 0   Ascorbic Acid (VITAMIN C) 500 MG CAPS, Take 500 mg by mouth daily., Disp: , Rfl:    ciprofloxacin-dexamethasone (CIPRODEX) OTIC suspension, Place 4 drops into the right ear 2 (two) times daily for 14 days., Disp: 7.5 mL, Rfl: 1   ibandronate (BONIVA) 150 MG tablet, Take 1 tablet (150 mg total) by mouth every 30 (thirty) days. Take in the morning with a full glass of water, on an empty stomach, and do not take anything else by mouth or lie down for the next 30 min., Disp: 3 tablet, Rfl: 3   Magnesium 250 MG TABS, Take 240 mg by mouth 2 (two) times daily. Per patient taking 120 mg twice a day, Disp: , Rfl:    ibuprofen (ADVIL) 200 MG tablet, Take 2 tablets (400 mg total) by mouth every 8 (eight) hours as needed for mild pain (pain score 1-3)., Disp: 30 tablet, Rfl: 0   Physical Exam:   Ht 5\' 7"  (1.702 m)   Wt 139 lb (63 kg)   BMI 21.77 kg/m    Salient findings:  CN II-XII intact - no facial numbness; HB 1/6 bilaterally; no facial numbness  Bilateral EAC with cerumen impaction; after clearance, clear and TM intact with well pneumatized middle ear spaces, slightly flakey skin right; canals somewhat narrow; she is NOT tender to manipulation of canal Clear audible bilateral TMJ clicking, not tender; right zygomatic arch slightly more prominent. No skin lesions or palpable parotid masses Weber 512: LEFT Rinne 512: AC > BC b/l  No gross nystagmus, gait normal Anterior rhinoscopy: Septum relatively midline; bilateral inferior turbinates without significant hypertrophy No lesions of oral cavity/oropharynx No obviously palpable neck masses/lymphadenopathy/thyromegaly No respiratory distress or stridor  Seprately Identifiable Procedures:  Procedure: Bilateral ear microscopy and cerumen removal using microscope (CPT 91478) - Mod 25 Pre-procedure diagnosis: Cerumen impaction  bilateral external ears Post-procedure diagnosis: same Indication: Bilateral cerumen impaction; given patient's otologic complaints and history as well as for improved and comprehensive examination of external ear and tympanic membrane, bilateral otologic examination using microscope was performed and impacted cerumen removed  Procedure: Patient was placed semi-recumbent. Both ear canals were examined using the microscope with findings above. Cerumen removed on left and on right using suction and currette with improvement in EAC examination and patency. Left: EAC was patent. TM was intact . Middle ear was aerated. Drainage: no Right: EAC was patent. TM was intact . Middle ear was aerated . Drainage: no Patient tolerated the procedure well.  Impression & Plans:  Mesha Fusselman is a 67 y.o. female with history of ICD placement 2/2 sudden cardiac arrest now with:  1. CPA (cerebellopontine angle) tumor (HCC)   2. Asymmetrical sensorineural hearing loss   3. Discomfort  of right ear   4. TMJ tenderness, bilateral   5. Bilateral impacted cerumen    - She clearly has a LEFT CPA angle mass -- this has not been worked up from ENT standpoint; corroborated by her audio showing asymmetric SNHL. She is not having any other symptoms or decline in hearing. - Will get follow up MRI IAC  Right ear discomfort and headache: appears to be on temporalis/masseter distribution; ear exam is currently benign otherwise, and I suspect that this maybe related to her TMJ. We will initiate: - Ibuprofen 400mg  q6h PRN - Warm compresses multiple times per day - Avoid gum chewing, nuts for 3-4 weeks - f/u in 4 weeks  Cerumen impaction: ciprodex drops 4 drops BID x14d  See below regarding exact medications prescribed this encounter including dosages and route: Meds ordered this encounter  Medications   ciprofloxacin-dexamethasone (CIPRODEX) OTIC suspension    Sig: Place 4 drops into the right ear 2 (two) times daily for 14 days.    Dispense:  7.5 mL    Refill:  1   ibuprofen (ADVIL) 200 MG tablet    Sig: Take 2 tablets (400 mg total) by mouth every 8 (eight) hours as needed for mild pain (pain score 1-3).    Dispense:  30 tablet    Refill:  0      Thank you for allowing me the opportunity to care for your patient. Please do not hesitate to contact me should you have any other questions.  Sincerely, Jovita Kussmaul, MD Otolarynoglogist (ENT), [facility] Phone: [phone] Fax: [phone]  09/14/2023, 4:39 PM   I have personally spent 64 minutes involved in face-to-face and non-face-to-face activities for this patient on the day of the visit.  Professional time spent includes the following activities, in addition to those noted in the documentation: preparing to see the patient (review of outside documentation and results - MRI), performing a medically appropriate examination and/or evaluation, counseling and educating the patient/family/caregiver, ordering  medications, performing procedures (cerumen removal), referring and communicating with other healthcare professionals, documenting clinical information in the electronic or other health record, independently interpreting results and communicating results with the patient.

## 2023-09-14 NOTE — Progress Notes (Signed)
  [address] [place], [place] [address] [phone]  Audiological Evaluation    Name: Allison Cannon     DOB:   August 20, 1956      MRN:   [id number]                                                                                     Service Date: 09/14/2023     Accompanied by: none   Patient comes today after Dr. Allena Katz, ENT sent a referral for a hearing evaluation due to concerns with hearing loss.   Symptoms Yes Details  Hearing loss  [x]  No previous audiogram on file. Patient perceives left sided hearing loss.  Tinnitus  []    Ear pain/ Ear infections  [x]  Sometimes in the right ear.  Balance problems  []  Reports that she had vertigo only once and it subsided.  Noise exposure  []    Previous ear surgeries  []    Family history  []    Amplification  []    Other  [x]  Reports some headaches. Also mentioned that she had an MRI done in 2022 that demonstrated a left sided vestibular schwannoma, but she has never had any ENT visit or hearing test after that incidental finding.     Otoscopy: Right ear: Clear external ear canals and notable landmarks visualized on the tympanic membrane. Left ear:  Clear external ear canals and notable landmarks visualized on the tympanic membrane.  Tympanometry: Right ear: Type A- Normal external ear canal volume with normal middle ear pressure and tympanic membrane compliance Left ear: Type Ad- Normal external ear canal volume with normal middle ear pressure and high tympanic membrane compliance  Pure tone Audiometry: Right ear- Normal to moderate sensorineural hearing loss from [phone] Hz.   Left ear-  Normal to severe sensorineural hearing loss from [phone] Hz.    The hearing test results were completed under headphones and re-checked with inserts and results are deemed to be of good reliability. Test technique:  conventional     Speech Audiometry: Right ear- Speech Reception Threshold (SRT) was obtained at 10 dBHL Left ear-Speech  Reception Threshold (SRT) was obtained at 20 dBHL   Word Recognition Score Tested using NU-6 (MLV) Right ear: 88% was obtained at a presentation level of 70 dBHL with contralateral masking which is deemed as  good . Left ear: 72% was obtained at a presentation level of 80 dBHL with contralateral masking which is deemed as  fair.    Impression: There is a significant difference in pure-tone thresholds between ears. Left ear is worse than right.   Recommendations: Follow up with ENT as scheduled for today. Return for a hearing evaluation if concerns with hearing changes arise or per MD recommendation. Consider a communication needs assessment after medical clearance for hearing aids is obtained, pending patient motivation.   [name], AUD

## 2023-09-21 ENCOUNTER — Ambulatory Visit (INDEPENDENT_AMBULATORY_CARE_PROVIDER_SITE_OTHER): Payer: Medicare Other

## 2023-09-21 DIAGNOSIS — I469 Cardiac arrest, cause unspecified: Secondary | ICD-10-CM | POA: Diagnosis not present

## 2023-09-22 LAB — CUP PACEART REMOTE DEVICE CHECK
Battery Remaining Longevity: 84 mo
Battery Remaining Percentage: 69 %
Battery Voltage: 2.98 V
Brady Statistic RV Percent Paced: 0 %
Date Time Interrogation Session: 20241203021459
HighPow Impedance: 70 Ohm
Implantable Lead Connection Status: 753985
Implantable Lead Implant Date: 20210603
Implantable Lead Location: 753860
Implantable Pulse Generator Implant Date: 20210603
Lead Channel Impedance Value: 410 Ohm
Lead Channel Pacing Threshold Amplitude: 0.75 V
Lead Channel Pacing Threshold Pulse Width: 0.5 ms
Lead Channel Sensing Intrinsic Amplitude: 12 mV
Lead Channel Setting Pacing Amplitude: 2.5 V
Lead Channel Setting Pacing Pulse Width: 0.5 ms
Lead Channel Setting Sensing Sensitivity: 0.5 mV
Pulse Gen Serial Number: 111007674

## 2023-09-23 ENCOUNTER — Ambulatory Visit: Payer: Medicare Other | Admitting: Obstetrics and Gynecology

## 2023-09-23 ENCOUNTER — Encounter: Payer: Self-pay | Admitting: Obstetrics and Gynecology

## 2023-09-23 VITALS — BP 124/80 | HR 80 | Wt 139.6 lb

## 2023-09-23 DIAGNOSIS — Z1331 Encounter for screening for depression: Secondary | ICD-10-CM | POA: Diagnosis not present

## 2023-09-23 DIAGNOSIS — N958 Other specified menopausal and perimenopausal disorders: Secondary | ICD-10-CM | POA: Diagnosis not present

## 2023-09-23 DIAGNOSIS — Z124 Encounter for screening for malignant neoplasm of cervix: Secondary | ICD-10-CM

## 2023-09-23 MED ORDER — ESTRADIOL 10 MCG VA TABS: 1.0000 | ORAL_TABLET | Freq: Every day | VAGINAL | 12 refills | Status: AC

## 2023-09-23 NOTE — Progress Notes (Signed)
NEW GYNECOLOGY PATIENT [name] MRN [id number]  Date of birth: [birth year] Chief Complaint:   Gynecologic Exam     History:  Allison Cannon is a 67 y.o. No obstetric history on file. being seen today for "abnormal pap".   2022 pap "unsatiafactory for evlauation" No hisotry of abnormal paps or needing cervical procedures for abnormal paps Had been on the pills for birth control for years and didn't really have periods so not exactly sure when here LMP was Late 39s likely menopause Has also been having vaginal dryness which has prevented sexual intercourse She is interested in some sort of lubricant for vaginal dryness, has purchased replense but has not used it     Gynecologic History No LMP recorded. Patient is postmenopausal. Contraception: post menopausal status Last Pap: 2022 unsatisfactory Last Mammogram:  11/2022 BIRADS 1 Last Colonoscopy:  2024  Obstetric History OB History  No obstetric history on file.    Past Medical History:  Diagnosis Date   AICD (automatic cardioverter/defibrillator) present    Arthritis of left hip    Myocardial infarction [facility])    Cardiac arrest 6/21   Osteoporosis    PONV (postoperative nausea and vomiting)    Seborrheic keratoses 12/30/2022   Sleep apnea     Past Surgical History:  Procedure Laterality Date   CHOLECYSTECTOMY     ICD IMPLANT N/A 03/21/2020   Procedure: ICD IMPLANT;  Surgeon: Marinus Maw, MD;  Location: [facility] INVASIVE CV LAB;  Service: Cardiovascular;  Laterality: N/A;   KNEE ARTHROSCOPY     x 2   LEFT HEART CATH AND CORONARY ANGIOGRAPHY N/A 03/21/2020   Procedure: LEFT HEART CATH AND CORONARY ANGIOGRAPHY;  Surgeon: Lennette Bihari, MD;  Location: [facility] INVASIVE CV LAB;  Service: Cardiovascular;  Laterality: N/A;   SP CHOLECYSTOMY     TOTAL HIP ARTHROPLASTY Left 01/30/2021   Procedure: TOTAL HIP ARTHROPLASTY ANTERIOR APPROACH;  Surgeon: Samson Frederic, MD;  Location: [facility] ORS;  Service: Orthopedics;   Laterality: Left;    Current Outpatient Medications on File Prior to Visit  Medication Sig Dispense Refill   acetaminophen (TYLENOL) 325 MG tablet Take 1-2 tablets (325-650 mg total) by mouth every 6 (six) hours as needed for mild pain (pain score 1-3 or temp > 100.5). 100 tablet 0   Ascorbic Acid (VITAMIN C) 500 MG CAPS Take 500 mg by mouth daily.     ciprofloxacin-dexamethasone (CIPRODEX) OTIC suspension Place 4 drops into the right ear 2 (two) times daily for 14 days. 7.5 mL 1   ibandronate (BONIVA) 150 MG tablet Take 1 tablet (150 mg total) by mouth every 30 (thirty) days. Take in the morning with a full glass of water, on an empty stomach, and do not take anything else by mouth or lie down for the next 30 min. 3 tablet 3   ibuprofen (ADVIL) 200 MG tablet Take 2 tablets (400 mg total) by mouth every 8 (eight) hours as needed for mild pain (pain score 1-3). 30 tablet 0   Magnesium 250 MG TABS Take 240 mg by mouth 2 (two) times daily. Per patient taking 120 mg twice a day     No current facility-administered medications on file prior to visit.    No Known Allergies  Social History:  reports that she has never smoked. She has never used smokeless tobacco. She reports current alcohol use of about 1.0 - 5.0 standard drink of alcohol per week. She reports that she does not use drugs.  Family History  Problem Relation Age of Onset   Stroke Mother    Breast cancer Mother 61   Stroke Father    Colon cancer Sister 55   Colon polyps Brother    Cancer Brother    Colon polyps Brother    Esophageal cancer Neg Hx    Rectal cancer Neg Hx    Stomach cancer Neg Hx     The following portions of the patient's history were reviewed and updated as appropriate: allergies, current medications, past family history, past medical history, past social history, past surgical history and problem list.  Review of Systems Pertinent items noted in HPI and remainder of comprehensive ROS otherwise  negative.  Physical Exam:  BP 124/80   Pulse 80   Wt 139 lb 9.6 oz (63.3 kg)   BMI 21.86 kg/m  Physical Exam Vitals and nursing note reviewed.  Constitutional:      Appearance: Normal appearance.  Pulmonary:     Effort: Pulmonary effort is normal.  Abdominal:     Palpations: Abdomen is soft.  Genitourinary:    General: Normal vulva.     Exam position: Lithotomy position.     Comments: No allodynia Atrophic changes of the vaginal introitus and urethral meatus No urethral caruncle  Neurological:     Mental Status: She is alert.        Assessment and Plan:   1. Genitourinary syndrome of menopause Discussed different options for vaginal dryness. Opts for vaginal estrogen. Noted safe to use with lubricants and should still use lubrication. May take up to 2-3 months to see improvement. Can message if she would like to change formulation. Encouraged to [employer] for refills as needed - Estradiol 10 MCG TABS vaginal tablet; Place 1 tablet (10 mcg total) vaginally at bedtime. Use every night for  2 weeks then twice a week afterwards  Dispense: 30 tablet; Refill: 12  2. Screening for cervical cancer Unsatisfactory pap likely due to postmenopausal status. Greater than 65, no need for continued paps if not immunocompromised or history of CIN2+ within the last 25 years.   Routine preventative health maintenance measures emphasized. Please refer to After Visit Summary for other counseling recommendations.   Follow-up: No follow-ups on file.      Lorriane Shire, MD Obstetrician & Gynecologist, [facility] [employer], [facility]

## 2023-10-14 ENCOUNTER — Ambulatory Visit (INDEPENDENT_AMBULATORY_CARE_PROVIDER_SITE_OTHER): Payer: Medicare Other

## 2023-11-09 ENCOUNTER — Ambulatory Visit (HOSPITAL_COMMUNITY)
Admission: RE | Admit: 2023-11-09 | Discharge: 2023-11-09 | Disposition: A | Discharge status: Home / Self Care | Payer: Medicare Other | Source: Ambulatory Visit | Attending: Otolaryngology | Admitting: Otolaryngology

## 2023-11-09 DIAGNOSIS — D333 Benign neoplasm of cranial nerves: Secondary | ICD-10-CM | POA: Insufficient documentation

## 2023-11-09 MED ORDER — GADOBUTROL 1 MMOL/ML IV SOLN
6.0000 mL | Freq: Once | INTRAVENOUS | Status: AC | PRN
  Administered 2023-11-09: 6 mL via INTRAVENOUS

## 2023-11-15 ENCOUNTER — Telehealth (INDEPENDENT_AMBULATORY_CARE_PROVIDER_SITE_OTHER): Payer: Self-pay | Admitting: Otolaryngology

## 2023-11-15 NOTE — Telephone Encounter (Signed)
Reminder Call: Date: 11/16/2023 Status: Sch  Time: 1:15 PM [address] [place], [place] [address]  Confirmed time and location w/patient.

## 2023-11-16 ENCOUNTER — Encounter (INDEPENDENT_AMBULATORY_CARE_PROVIDER_SITE_OTHER): Payer: Self-pay

## 2023-11-16 ENCOUNTER — Ambulatory Visit (INDEPENDENT_AMBULATORY_CARE_PROVIDER_SITE_OTHER): Payer: Medicare Other | Admitting: Otolaryngology

## 2023-11-16 VITALS — BP 124/72 | HR 77 | Resp 19 | Wt 139.0 lb

## 2023-11-16 DIAGNOSIS — H903 Sensorineural hearing loss, bilateral: Secondary | ICD-10-CM

## 2023-11-16 DIAGNOSIS — D333 Benign neoplasm of cranial nerves: Secondary | ICD-10-CM

## 2023-11-16 NOTE — Progress Notes (Signed)
Dear Dr. Suezanne Jacquet, Here is my assessment for our mutual patient, Allison Cannon. Thank you for allowing me the opportunity to care for your patient. Please do not hesitate to contact me should you have any other questions. Sincerely, Dr. Jovita Kussmaul  Otolaryngology Clinic Note Referring provider: Dr. Suezanne Jacquet HPI:  Allison Cannon is a 68 y.o. female kindly referred by Dr. Suezanne Jacquet for evaluation of right ear pain and headache.  Initial visit (09/14/2023): Patient reports: close to 6 weeks ago, had some headache (points to temporalis) and right ear ache. She saw her PCP who prescribed cortisporin for two weeks and then stopped it. It seemed to help her discomfort, but did not resolve completely. Leveled off. She then saw her PCP for it for follow up about 3 weeks ago, wax cleaned on left, still ache on right. No more headaches around temporalis area. There is still a dull ache but it is intermittent. No antecedent event including URI, no jaw or dental pain, ear itching, no fevers/chills. Does have TMJ. She does report she eats a fair amount of nuts, she also reports she has been chewing more gum after meals.  Hears ok out of right ear, but left with some hearing loss. Of note, she reports that prior to her ICD placement in 2021, she had full body MRI which showed a left CPA angle mass. She has never seen an ENT for this and no one has followed up with her about this. No prior audiograms  Patient denies: fullness, vertigo, imbalance, drainage, tinnitus, dizziness, facial numbness Patient additionally denies: deep pain in ear canal, eustachian tube symptoms such as popping, crackling, sensitivity to pressure changes. Patient also denies barotrauma, vestibular suppressant use, ototoxic medication use Prior ear surgery: no -------------------------------------------------------- We decided to get a repeat MRI and audio showed asymmetric HL 11/16/2023: Presents for f/u. She is still asymptomatic (essentially  besides HL) from ear standpoint. We discussed her MRI results and answered questions. Husband works in [employer]. ------------------------------------------------------------- H&N Surgery: no Personal or FHx of bleeding dz or anesthesia difficulty: no   GLP-1: no AP/AC: no  Tobacco: no. Occupation: CFO of a Company. Lives in [place], [place]  PMHx: Cardiac arrest (s/p ICD 3.5 years ago), OSA, Osteoporosis  Independent Review of Additional Tests or Records:  Referral notes reviewed:  07/22/2023: "States she was waking up with headaches last week. Also having headaches during the day for the past couple of days. Headache on the right side near her ear. Also c/o right ear pain and maxillary sinus tenderness (improving). " 08/24/2023: "Left ear lavage performed and she tolerated this well. Normal appearing TM without improvement of hearing on left. Referral to ENT. Right ear lavage not done due to patient discomfort. Unable to visualize TM. Treated with antibiotic therapy at last visit. Referral to ENT"  MRI 2021: LEFT CPA angle mass, 1 x 1.6 x 0.9 cm; no mastoid effusion. No other lesions noted   MRI IAC 11/09/2023: independently reviewed and interpreted, showing left CPA angle mass, now 1x5x2.1x1.4 compared to 1.5x1.1x0.9 cm in 2021. No mastoid effusion; no right CPA mass. Likely left schwannoma.  09/14/2023 Audiogram was independently reviewed and interpreted by me and it reveals Right ear: normal downsloping to mod SNHL; 88% word interpretation at 70dB; type A tympanogram Left ear: normal downsloping to mod-severe/sever SNHL - asymmetric; 72% word interpretation at 80dB; type A tympanogram    SNHL= Sensorineural hearing loss   PMH/Meds/All/SocHx/FamHx/ROS:   Past Medical History:  Diagnosis Date   AICD (automatic  cardioverter/defibrillator) present    Arthritis of left hip    Myocardial infarction [facility])    Cardiac arrest 6/21   Osteoporosis    PONV (postoperative nausea and  vomiting)    Seborrheic keratoses 12/30/2022   Sleep apnea      Past Surgical History:  Procedure Laterality Date   CHOLECYSTECTOMY     ICD IMPLANT N/A 03/21/2020   Procedure: ICD IMPLANT;  Surgeon: Marinus Maw, MD;  Location: [facility] INVASIVE CV LAB;  Service: Cardiovascular;  Laterality: N/A;   KNEE ARTHROSCOPY     x 2   LEFT HEART CATH AND CORONARY ANGIOGRAPHY N/A 03/21/2020   Procedure: LEFT HEART CATH AND CORONARY ANGIOGRAPHY;  Surgeon: Lennette Bihari, MD;  Location: [facility] INVASIVE CV LAB;  Service: Cardiovascular;  Laterality: N/A;   SP CHOLECYSTOMY     TOTAL HIP ARTHROPLASTY Left 01/30/2021   Procedure: TOTAL HIP ARTHROPLASTY ANTERIOR APPROACH;  Surgeon: Samson Frederic, MD;  Location: [facility] ORS;  Service: Orthopedics;  Laterality: Left;    Family History  Problem Relation Age of Onset   Stroke Mother    Breast cancer Mother 67   Stroke Father    Colon cancer Sister 48   Colon polyps Brother    Cancer Brother    Colon polyps Brother    Esophageal cancer Neg Hx    Rectal cancer Neg Hx    Stomach cancer Neg Hx      Social Connections: Moderately Integrated (08/05/2023)   Social Connection and Isolation Panel [NHANES]    Frequency of Communication with Friends and Family: Three times a week    Frequency of Social Gatherings with Friends and Family: More than three times a week    Attends Religious Services: Never    Database administrator or Organizations: Yes    Attends Banker Meetings: Never    Marital Status: Married      Current Outpatient Medications:    acetaminophen (TYLENOL) 325 MG tablet, Take 1-2 tablets (325-650 mg total) by mouth every 6 (six) hours as needed for mild pain (pain score 1-3 or temp > 100.5)., Disp: 100 tablet, Rfl: 0   Ascorbic Acid (VITAMIN C) 500 MG CAPS, Take 500 mg by mouth daily., Disp: , Rfl:    Estradiol 10 MCG TABS vaginal tablet, Place 1 tablet (10 mcg total) vaginally at bedtime. Use every night for  2 weeks then twice a week  afterwards, Disp: 30 tablet, Rfl: 12   ibandronate (BONIVA) 150 MG tablet, Take 1 tablet (150 mg total) by mouth every 30 (thirty) days. Take in the morning with a full glass of water, on an empty stomach, and do not take anything else by mouth or lie down for the next 30 min., Disp: 3 tablet, Rfl: 3   ibuprofen (ADVIL) 200 MG tablet, Take 2 tablets (400 mg total) by mouth every 8 (eight) hours as needed for mild pain (pain score 1-3)., Disp: 30 tablet, Rfl: 0   Magnesium 250 MG TABS, Take 240 mg by mouth 2 (two) times daily. Per patient taking 120 mg twice a day, Disp: , Rfl:    Physical Exam:   BP 124/72 (BP Location: Right Arm, Patient Position: Sitting, Cuff Size: Normal)   Pulse 77   Resp 19   Wt 139 lb (63 kg)   SpO2 94%   BMI 21.77 kg/m   Salient findings:  CN II-XII intact - no facial numbness; HB 1/6 bilaterally B/l EAC clear and TM intact with well  pneumatized middle ear spaces, canals somewhat narrow; she is NOT tender to manipulation of canal Clear audible bilateral TMJ clicking, not tender; right zygomatic arch slightly more prominent. No skin lesions or palpable parotid masses Weber 512: LEFT Rinne 512: AC > BC b/l  No gross nystagmus, gait normal Anterior rhinoscopy: Septum relatively midline; bilateral inferior turbinates without significant hypertrophy No lesions of oral cavity/oropharynx No obviously palpable neck masses/lymphadenopathy/thyromegaly No respiratory distress or stridor  Seprately Identifiable Procedures:  None today  Impression & Plans:  Allison Cannon is a 68 y.o. female with history of ICD placement 2/2 sudden cardiac arrest now with:  1. CPA (cerebellopontine angle) tumor (HCC)   2. Asymmetric SNHL (sensorineural hearing loss)   3. Sensorineural hearing loss, bilateral     Left CPA mass which was not followed by ENT prior; we got an MRI recently which shows fair amount of growth. Audio showing asymmetric SNHL. She is not having any other symptoms  or decline in hearing.  Given growth, discussed options. She would likely be a candidate for gamma knife but will refer her to [facility] for eval and treatment.   Follow up with me as needed    Thank you for allowing me the opportunity to care for your patient. Please do not hesitate to contact me should you have any other questions.  Sincerely, Jovita Kussmaul, MD Otolaryngologist Phone: [phone] Fax: [phone]  11/16/2023, 1:20 PM   MDM:  Level 4: 99215 Complexity/Problems addressed: high - skull base mass Data complexity: mod - independent interpretation of imaging (MRI) - Morbidity: high - possible surgical intervention, refer to [facility] - Prescription Drug prescribed or managed: no

## 2023-11-18 ENCOUNTER — Encounter (INDEPENDENT_AMBULATORY_CARE_PROVIDER_SITE_OTHER): Payer: Self-pay

## 2023-11-30 ENCOUNTER — Encounter: Payer: Self-pay | Admitting: Family Medicine

## 2023-11-30 DIAGNOSIS — M816 Localized osteoporosis [Lequesne]: Secondary | ICD-10-CM

## 2023-12-01 MED ORDER — IBANDRONATE SODIUM 150 MG PO TABS: 150.0000 mg | ORAL_TABLET | ORAL | 3 refills | Status: DC

## 2023-12-21 ENCOUNTER — Ambulatory Visit (INDEPENDENT_AMBULATORY_CARE_PROVIDER_SITE_OTHER): Payer: Medicare Other

## 2023-12-21 DIAGNOSIS — I469 Cardiac arrest, cause unspecified: Secondary | ICD-10-CM

## 2023-12-23 ENCOUNTER — Encounter: Payer: Self-pay | Admitting: Internal Medicine

## 2023-12-23 LAB — CUP PACEART REMOTE DEVICE CHECK
Battery Remaining Longevity: 81 mo
Battery Remaining Percentage: 67 %
Battery Voltage: 2.98 V
Brady Statistic RV Percent Paced: 1 %
Date Time Interrogation Session: 20250304020505
HighPow Impedance: 73 Ohm
Implantable Lead Connection Status: 753985
Implantable Lead Implant Date: 20210603
Implantable Lead Location: 753860
Implantable Pulse Generator Implant Date: 20210603
Lead Channel Impedance Value: 440 Ohm
Lead Channel Pacing Threshold Amplitude: 0.75 V
Lead Channel Pacing Threshold Pulse Width: 0.5 ms
Lead Channel Sensing Intrinsic Amplitude: 12 mV
Lead Channel Setting Pacing Amplitude: 2.5 V
Lead Channel Setting Pacing Pulse Width: 0.5 ms
Lead Channel Setting Sensing Sensitivity: 0.5 mV
Pulse Gen Serial Number: 111007674

## 2024-01-26 NOTE — Addendum Note (Signed)
 Addended by: Geralyn Flash D on: 01/26/2024 02:16 PM   Modules accepted: Orders

## 2024-01-26 NOTE — Progress Notes (Signed)
 Remote ICD transmission.

## 2024-03-21 ENCOUNTER — Ambulatory Visit (INDEPENDENT_AMBULATORY_CARE_PROVIDER_SITE_OTHER): Payer: Medicare Other

## 2024-03-21 DIAGNOSIS — I469 Cardiac arrest, cause unspecified: Secondary | ICD-10-CM | POA: Diagnosis not present

## 2024-03-21 LAB — CUP PACEART REMOTE DEVICE CHECK
Battery Remaining Longevity: 79 mo
Battery Remaining Percentage: 65 %
Battery Voltage: 2.98 V
Brady Statistic RV Percent Paced: 1 %
Date Time Interrogation Session: 20250603071208
HighPow Impedance: 68 Ohm
Implantable Lead Connection Status: 753985
Implantable Lead Implant Date: 20210603
Implantable Lead Location: 753860
Implantable Pulse Generator Implant Date: 20210603
Lead Channel Impedance Value: 410 Ohm
Lead Channel Pacing Threshold Amplitude: 0.75 V
Lead Channel Pacing Threshold Pulse Width: 0.5 ms
Lead Channel Sensing Intrinsic Amplitude: 12 mV
Lead Channel Setting Pacing Amplitude: 2.5 V
Lead Channel Setting Pacing Pulse Width: 0.5 ms
Lead Channel Setting Sensing Sensitivity: 0.5 mV
Pulse Gen Serial Number: 111007674

## 2024-03-23 ENCOUNTER — Ambulatory Visit: Payer: Self-pay | Admitting: Internal Medicine

## 2024-05-17 NOTE — Progress Notes (Signed)
 Remote ICD transmission.

## 2024-06-20 ENCOUNTER — Ambulatory Visit (INDEPENDENT_AMBULATORY_CARE_PROVIDER_SITE_OTHER)

## 2024-06-20 DIAGNOSIS — I469 Cardiac arrest, cause unspecified: Secondary | ICD-10-CM | POA: Diagnosis not present

## 2024-06-22 LAB — CUP PACEART REMOTE DEVICE CHECK
Battery Remaining Longevity: 77 mo
Battery Remaining Percentage: 63 %
Battery Voltage: 2.98 V
Brady Statistic RV Percent Paced: 1 %
Date Time Interrogation Session: 20250902020353
HighPow Impedance: 73 Ohm
Implantable Lead Connection Status: 753985
Implantable Lead Implant Date: 20210603
Implantable Lead Location: 753860
Implantable Pulse Generator Implant Date: 20210603
Lead Channel Impedance Value: 400 Ohm
Lead Channel Pacing Threshold Amplitude: 0.75 V
Lead Channel Pacing Threshold Pulse Width: 0.5 ms
Lead Channel Sensing Intrinsic Amplitude: 12 mV
Lead Channel Setting Pacing Amplitude: 2.5 V
Lead Channel Setting Pacing Pulse Width: 0.5 ms
Lead Channel Setting Sensing Sensitivity: 0.5 mV
Pulse Gen Serial Number: 111007674

## 2024-06-25 ENCOUNTER — Ambulatory Visit: Payer: Self-pay | Admitting: Internal Medicine

## 2024-06-27 NOTE — Progress Notes (Signed)
Remote ICD Transmission.

## 2024-07-17 ENCOUNTER — Telehealth: Payer: Self-pay

## 2024-07-17 NOTE — Telephone Encounter (Signed)
 Copied from CRM #[id number]. Topic: Clinical - Request for Lab/Test Order >> Jul 17, 2024  2:46 PM Carlyon D wrote:  Reason for CRM: Pt would like to get the Pneumococcal (Pneumovax) vaccine please reach out to pt when order is entered to schedule.

## 2024-07-17 NOTE — Telephone Encounter (Signed)
 Please advise if appropriate, if so which should she get?

## 2024-07-18 NOTE — Telephone Encounter (Signed)
 LM for pt

## 2024-08-10 ENCOUNTER — Ambulatory Visit (INDEPENDENT_AMBULATORY_CARE_PROVIDER_SITE_OTHER): Payer: Medicare Other

## 2024-08-10 VITALS — BP 100/78 | HR 77 | Ht 67.0 in | Wt 136.6 lb

## 2024-08-10 DIAGNOSIS — Z0000 Encounter for general adult medical examination without abnormal findings: Secondary | ICD-10-CM

## 2024-08-10 NOTE — Patient Instructions (Addendum)
 Allison Cannon,  Thank you for taking the time for your Medicare Wellness Visit. I appreciate your continued commitment to your health goals. Please review the care plan we discussed, and feel free to reach out if I can assist you further.  Medicare recommends these wellness visits once per year to help you and your care team stay ahead of potential health issues. These visits are designed to focus on prevention, allowing your provider to concentrate on managing your acute and chronic conditions during your regular appointments.  Please note that Annual Wellness Visits do not include a physical exam. Some assessments may be limited, especially if the visit was conducted virtually. If needed, we may recommend a separate in-person follow-up with your provider.  Ongoing Care Seeing your primary care provider every 3 to 6 months helps us  monitor your health and provide consistent, personalized care. Next office visit on 08/31/2024.  Keep up the good work.  Referrals If a referral was made during today's visit and you haven't received any updates within two weeks, please contact the referred provider directly to check on the status.  Recommended Screenings:  Health Maintenance  Topic Date Due   DTaP/Tdap/Td vaccine (1 - Tdap) Never done   Flu Shot  05/19/2024   COVID-19 Vaccine (8 - 2025-26 season) 06/19/2024   Medicare Annual Wellness Visit  08/04/2024   Breast Cancer Screening  11/20/2024   Colon Cancer Screening  12/15/2027   Pneumococcal Vaccine for age over 93  Completed   DEXA scan (bone density measurement)  Completed   Hepatitis C Screening  Completed   Zoster (Shingles) Vaccine  Completed   Meningitis B Vaccine  Aged Out       08/05/2023    9:08 AM  Advanced Directives  Does Patient Have a Medical Advance Directive? Yes  Type of Estate agent of [place];Living will  Does patient want to make changes to medical advance directive? No - Patient declined  Copy  of Healthcare Power of Attorney in Chart? Yes - validated most recent copy scanned in chart (See row information)   Advance Care Planning is important because it: Ensures you receive medical care that aligns with your values, goals, and preferences. Provides guidance to your family and loved ones, reducing the emotional burden of decision-making during critical moments.  Vision: Annual vision screenings are recommended for early detection of glaucoma, cataracts, and diabetic retinopathy. These exams can also reveal signs of chronic conditions such as diabetes and high blood pressure.  Dental: Annual dental screenings help detect early signs of oral cancer, gum disease, and other conditions linked to overall health, including heart disease and diabetes.  Please see the attached documents for additional preventive care recommendations.

## 2024-08-10 NOTE — Progress Notes (Signed)
 Subjective:   Allison Cannon is a 68 y.o. who presents for a Medicare Wellness preventive visit.  As a reminder, Annual Wellness Visits don't include a physical exam, and some assessments may be limited, especially if this visit is performed virtually. We may recommend an in-person follow-up visit with your provider if needed.  Visit Complete: In person  Persons Participating in Visit: Patient.  AWV Questionnaire: No: Patient Medicare AWV questionnaire was not completed prior to this visit.  Cardiac Risk Factors include: advanced age (>15men, >34 women);Other (see comment), Risk factor comments: OSA, cardiac arrest     Objective:    Today's Vitals   08/10/24 0842  BP: 100/78  Pulse: 77  SpO2: 99%  Weight: 136 lb 9.6 oz (62 kg)  Height: 5' 7 (1.702 m)   Body mass index is 21.39 kg/m.     08/10/2024    8:46 AM 08/05/2023    9:08 AM 08/18/2022    8:13 AM 07/29/2021    2:31 PM 01/30/2021   11:44 AM 01/21/2021    8:45 AM 03/19/2020    8:12 PM  Advanced Directives  Does Patient Have a Medical Advance Directive? Yes Yes Yes Yes Yes Yes No  Type of Estate agent of [place];Living will Healthcare Power of [place];Living will Healthcare Power of [place];Living will Living will;Healthcare Power of [employer] Power of [place];Living will Healthcare Power of [place];Living will   Does patient want to make changes to medical advance directive? No - Patient declined No - Patient declined No - Patient declined  No - Patient declined    Copy of Healthcare Power of Attorney in Chart? Yes - validated most recent copy scanned in chart (See row information) Yes - validated most recent copy scanned in chart (See row information) Yes - validated most recent copy scanned in chart (See row information) No - copy requested No - copy requested    Would patient like information on creating a medical advance directive?       No - Patient declined    Current Medications  (verified) Outpatient Encounter Medications as of 08/10/2024  Medication Sig   acetaminophen  (TYLENOL ) 325 MG tablet Take 1-2 tablets (325-650 mg total) by mouth every 6 (six) hours as needed for mild pain (pain score 1-3 or temp > 100.5).   Ascorbic Acid (VITAMIN C) 500 MG CAPS Take 500 mg by mouth daily.   Estradiol  10 MCG TABS vaginal tablet Place 1 tablet (10 mcg total) vaginally at bedtime. Use every night for  2 weeks then twice a week afterwards   ibandronate  (BONIVA ) 150 MG tablet Take 1 tablet (150 mg total) by mouth every 30 (thirty) days. Take in the morning with a full glass of water , on an empty stomach, and do not take anything else by mouth or lie down for the next 30 min.   ibuprofen  (ADVIL ) 200 MG tablet Take 2 tablets (400 mg total) by mouth every 8 (eight) hours as needed for mild pain (pain score 1-3).   Magnesium  250 MG TABS Take 240 mg by mouth 2 (two) times daily. Per patient taking 120 mg twice a day   No facility-administered encounter medications on file as of 08/10/2024.    Allergies (verified) Patient has no known allergies.   History: Past Medical History:  Diagnosis Date   AICD (automatic cardioverter/defibrillator) present    Arthritis of left hip    Myocardial infarction [facility])    Cardiac arrest 6/21   Osteoporosis  PONV (postoperative nausea and vomiting)    Seborrheic keratoses 12/30/2022   Sleep apnea    Past Surgical History:  Procedure Laterality Date   CHOLECYSTECTOMY     ICD IMPLANT N/A 03/21/2020   Procedure: ICD IMPLANT;  Surgeon: Waddell Danelle ORN, MD;  Location[facility] INVASIVE CV LAB;  Service: Cardiovascular;  Laterality: N/A;   KNEE ARTHROSCOPY     x 2   LEFT HEART CATH AND CORONARY ANGIOGRAPHY N/A 03/21/2020   Procedure: LEFT HEART CATH AND CORONARY ANGIOGRAPHY;  Surgeon: Burnard Debby LABOR, MD;  Location: [facility] INVASIVE CV LAB;  Service: Cardiovascular;  Laterality: N/A;   SP CHOLECYSTOMY     TOTAL HIP ARTHROPLASTY Left 01/30/2021   Procedure:  TOTAL HIP ARTHROPLASTY ANTERIOR APPROACH;  Surgeon: Fidel Rogue, MD;  Location: [facility] ORS;  Service: Orthopedics;  Laterality: Left;   Family History  Problem Relation Age of Onset   Stroke Mother    Breast cancer Mother 48   Stroke Father    Colon cancer Sister 27   Colon polyps Brother    Cancer Brother    Colon polyps Brother    Esophageal cancer Neg Hx    Rectal cancer Neg Hx    Stomach cancer Neg Hx    Social History   Socioeconomic History   Marital status: Married    Spouse name: Allison Cannon   Number of children: 0   Years of education: Not on file   Highest education level: Not on file  Occupational History   Occupation: Retired  Tobacco Use   Smoking status: Never   Smokeless tobacco: Never  Vaping Use   Vaping status: Never Used  Substance and Sexual Activity   Alcohol  use: Yes    Alcohol /week: 1.0 - 5.0 standard drink of alcohol     Types: 1 - 5 Glasses of wine per week    Comment: glass daily    Drug use: Never   Sexual activity: Not Currently  Other Topics Concern   Not on file  Social History Narrative   ** Merged History Encounter **    Lives with her husband. 1 cat name Lucy./2025   Social Drivers of Health   Financial Resource Strain: Low Risk  (08/10/2024)   Overall Financial Resource Strain (CARDIA)    Difficulty of Paying Living Expenses: Not hard at all  Food Insecurity: No Food Insecurity (08/10/2024)   Hunger Vital Sign    Worried About Running Out of Food in the Last Year: Never true    Ran Out of Food in the Last Year: Never true  Transportation Needs: No Transportation Needs (08/10/2024)   PRAPARE - Administrator, Civil Service (Medical): No    Lack of Transportation (Non-Medical): No  Physical Activity: Sufficiently Active (08/10/2024)   Exercise Vital Sign    Days of Exercise per Week: 7 days    Minutes of Exercise per Session: 40 min  Stress: No Stress Concern Present (08/10/2024)   [employer] of Occupational  Health - Occupational Stress Questionnaire    Feeling of Stress: Not at all  Social Connections: Moderately Integrated (08/10/2024)   Social Connection and Isolation Panel    Frequency of Communication with Friends and Family: More than three times a week    Frequency of Social Gatherings with Friends and Family: More than three times a week    Attends Religious Services: Never    Database administrator or Organizations: Yes    Attends Banker Meetings: Never  Marital Status: Married    Tobacco Counseling Counseling given: Not Answered    Clinical Intake:     Pain : No/denies pain     BMI - recorded: 21.39 Nutritional Status: BMI of 19-24  Normal Nutritional Risks: None Diabetes: No  Lab Results  Component Value Date   HGBA1C 5.3 01/14/2021   HGBA1C 5.5 03/19/2020     How often do you need to have someone help you when you read instructions, pamphlets, or other written materials from your doctor or pharmacy?: 1 - Never     Information entered by :: Apolonio Cutting, RMA   Activities of Daily Living     08/10/2024    8:36 AM  In your present state of health, do you have any difficulty performing the following activities:  Hearing? 1  Comment has some  difficulty in lt ear  Vision? 0  Difficulty concentrating or making decisions? 0  Walking or climbing stairs? 0  Dressing or bathing? 0  Doing errands, shopping? 0  Preparing Food and eating ? N  Using the Toilet? N  In the past six months, have you accidently leaked urine? N  Do you have problems with loss of bowel control? N  Managing your Medications? N  Managing your Finances? N  Housekeeping or managing your Housekeeping? N    Patient Care Team: Lendia Boby CROME, NP-C as PCP - General (Family Medicine) Waddell Danelle ORN, MD as PCP - Cardiology (Cardiology) Patient, No Pcp Per (General Practice)  I have updated your Care Teams any recent Medical Services you may have received from other  providers in the past year.     Assessment:   This is a routine wellness examination for [place].  Hearing/Vision screen Hearing Screening - Comments:: has some difficulty in lt ear Vision Screening - Comments:: [facility]/not up to date   Goals Addressed               This Visit's Progress     Patient Stated (pt-stated)   On track     To stay active as long as she can       Depression Screen     08/10/2024    8:50 AM 09/23/2023   10:30 AM 08/05/2023    9:15 AM 07/22/2023    1:00 PM 12/30/2022    9:44 AM 08/18/2022    8:13 AM 07/26/2020    2:05 PM  PHQ 2/9 Scores  PHQ - 2 Score 0 0 0 0 0 0 0  PHQ- 9 Score 1 0 0        Fall Risk     08/10/2024    8:46 AM 08/05/2023    9:09 AM 07/22/2023    1:00 PM 12/30/2022    9:44 AM 08/18/2022    8:14 AM  Fall Risk   Falls in the past year? 0 0 0 0 0  Number falls in past yr: 0  0 0 0  Injury with Fall? 0  0 0 0  Risk for fall due to :   No Fall Risks No Fall Risks No Fall Risks  Follow up Falls evaluation completed;Falls prevention discussed Falls evaluation completed;Falls prevention discussed Falls evaluation completed Falls evaluation completed Falls evaluation completed      Data saved with a previous flowsheet row definition    MEDICARE RISK AT HOME:  Medicare Risk at Home Any stairs in or around the home?: Yes (basement) If so, are there any without handrails?: No Home free of loose  throw rugs in walkways, pet beds, electrical cords, etc?: Yes Adequate lighting in your home to reduce risk of falls?: Yes Life alert?: No Use of a cane, walker or w/c?: No Grab bars in the bathroom?: Yes Shower chair or bench in shower?: Yes Elevated toilet seat or a handicapped toilet?: Yes  TIMED UP AND GO:  Was the test performed?  Yes  Length of time to ambulate 10 feet: 10 sec Gait steady and fast without use of assistive device  Cognitive Function: 6CIT completed        08/10/2024    8:47 AM 08/05/2023    9:11 AM  6CIT  Screen  What Year? 0 points 0 points  What month? 0 points 0 points  What time? 0 points 0 points  Count back from 20 0 points 0 points  Months in reverse 0 points 0 points  Repeat phrase 0 points 0 points  Total Score 0 points 0 points    Immunizations Immunization History  Administered Date(s) Administered    sv, Bivalent, Protein Subunit Rsvpref,pf (Abrysvo) 08/11/2022   Influenza-Unspecified 06/27/2020, 07/25/2021, 07/15/2022   PFIZER(Purple Top)SARS-COV-2 Vaccination 01/09/2020, 01/30/2020, 09/04/2020   Pfizer Covid-19 Vaccine Bivalent Booster 49yrs & up 02/20/2021, 08/01/2021, 02/19/2022, 07/31/2022   Pneumococcal Conjugate-13 08/03/2020   Pneumococcal Polysaccharide-23 08/03/2020   Zoster Recombinant(Shingrix) 08/01/2018, 10/17/2018    Screening Tests Health Maintenance  Topic Date Due   DTaP/Tdap/Td (1 - Tdap) Never done   Influenza Vaccine  05/19/2024   COVID-19 Vaccine (8 - 2025-26 season) 06/19/2024   Mammogram  11/20/2024   Medicare Annual Wellness (AWV)  08/10/2025   Colonoscopy  12/15/2027   Pneumococcal Vaccine: 50+ Years  Completed   DEXA SCAN  Completed   Hepatitis C Screening  Completed   Zoster Vaccines- Shingrix  Completed   Meningococcal B Vaccine  Aged Out    Health Maintenance Items Addressed: Vaccines Due: Tdap, See Nurse Notes at the end of this note  Additional Screening:  Vision Screening: Recommended annual ophthalmology exams for early detection of glaucoma and other disorders of the eye. Is the patient up to date with their annual eye exam?  No  Who is the provider or what is the name of the office in which the patient attends annual eye exams? [facility]/patient not up to date.  Dental Screening: Recommended annual dental exams for proper oral hygiene  Community Resource Referral / Chronic Care Management: CRR required this visit?  No   CCM required this visit?  No   Plan:    I have personally reviewed and noted the following in the  patient's chart:   Medical and social history Use of alcohol , tobacco or illicit drugs  Current medications and supplements including opioid prescriptions. Patient is not currently taking opioid prescriptions. Functional ability and status Nutritional status Physical activity Advanced directives List of other physicians Hospitalizations, surgeries, and ER visits in previous 12 months Vitals Screenings to include cognitive, depression, and falls Referrals and appointments  In addition, I have reviewed and discussed with patient certain preventive protocols, quality metrics, and best practice recommendations. A written personalized care plan for preventive services as well as general preventive health recommendations were provided to patient.   Azaleah Usman L Cherry Wittwer, CMA   08/10/2024   After Visit Summary: (MyChart) Due to this being a telephonic visit, the after visit summary with patients personalized plan was offered to patient via MyChart   Notes: Patient is due for a Tdap.  She stated that she had her flu  vaccine given on 07/17/2024, however it has not been documented in [place] as of yet. Patient will schedule this visit for next year at the same time for her CPE next year. Patient is up to date on all other health maintenance with no concerns to address today.

## 2024-08-31 ENCOUNTER — Encounter: Payer: Self-pay | Admitting: Family Medicine

## 2024-08-31 ENCOUNTER — Ambulatory Visit (INDEPENDENT_AMBULATORY_CARE_PROVIDER_SITE_OTHER): Admitting: Family Medicine

## 2024-08-31 VITALS — BP 122/76 | HR 85 | Temp 97.8°F | Ht 67.0 in | Wt 135.0 lb

## 2024-08-31 DIAGNOSIS — H9192 Unspecified hearing loss, left ear: Secondary | ICD-10-CM | POA: Diagnosis not present

## 2024-08-31 DIAGNOSIS — Z0184 Encounter for antibody response examination: Secondary | ICD-10-CM

## 2024-08-31 DIAGNOSIS — M816 Localized osteoporosis [Lequesne]: Secondary | ICD-10-CM

## 2024-08-31 DIAGNOSIS — Z8674 Personal history of sudden cardiac arrest: Secondary | ICD-10-CM

## 2024-08-31 DIAGNOSIS — Z0001 Encounter for general adult medical examination with abnormal findings: Secondary | ICD-10-CM

## 2024-08-31 DIAGNOSIS — E7800 Pure hypercholesterolemia, unspecified: Secondary | ICD-10-CM

## 2024-08-31 DIAGNOSIS — E559 Vitamin D deficiency, unspecified: Secondary | ICD-10-CM

## 2024-08-31 DIAGNOSIS — E612 Magnesium deficiency: Secondary | ICD-10-CM

## 2024-08-31 DIAGNOSIS — E2839 Other primary ovarian failure: Secondary | ICD-10-CM

## 2024-08-31 LAB — CBC WITH DIFFERENTIAL/PLATELET
Basophils Absolute: 0 K/uL (ref 0.0–0.1)
Basophils Relative: 0.6 % (ref 0.0–3.0)
Eosinophils Absolute: 0.1 K/uL (ref 0.0–0.7)
Eosinophils Relative: 1.1 % (ref 0.0–5.0)
HCT: 42.9 % (ref 36.0–46.0)
Hemoglobin: 14.4 g/dL (ref 12.0–15.0)
Lymphocytes Relative: 21.7 % (ref 12.0–46.0)
Lymphs Abs: 1.5 K/uL (ref 0.7–4.0)
MCHC: 33.5 g/dL (ref 30.0–36.0)
MCV: 86.7 fl (ref 78.0–100.0)
Monocytes Absolute: 0.3 K/uL (ref 0.1–1.0)
Monocytes Relative: 5 % (ref 3.0–12.0)
Neutro Abs: 4.9 K/uL (ref 1.4–7.7)
Neutrophils Relative %: 71.6 % (ref 43.0–77.0)
Platelets: 247 K/uL (ref 150.0–400.0)
RBC: 4.95 Mil/uL (ref 3.87–5.11)
RDW: 13.6 % (ref 11.5–15.5)
WBC: 6.9 K/uL (ref 4.0–10.5)

## 2024-08-31 LAB — LIPID PANEL
Cholesterol: 198 mg/dL (ref 0–200)
HDL: 103 mg/dL (ref >39.00)
LDL Cholesterol: 80 mg/dL (ref 0–99)
NonHDL: 95.33
Total CHOL/HDL Ratio: 2
Triglycerides: 76 mg/dL (ref 0.0–149.0)
VLDL: 15.2 mg/dL (ref 0.0–40.0)

## 2024-08-31 LAB — COMPREHENSIVE METABOLIC PANEL WITH GFR
ALT: 17 U/L (ref 0–35)
AST: 19 U/L (ref 0–37)
Albumin: 4.6 g/dL (ref 3.5–5.2)
Alkaline Phosphatase: 69 U/L (ref 39–117)
BUN: 13 mg/dL (ref 6–23)
CO2: 27 meq/L (ref 19–32)
Calcium: 9.6 mg/dL (ref 8.4–10.5)
Chloride: 102 meq/L (ref 96–112)
Creatinine, Ser: 0.72 mg/dL (ref 0.40–1.20)
GFR: 85.52 mL/min (ref >60.00)
Glucose, Bld: 106 mg/dL — ABNORMAL HIGH (ref 70–99)
Potassium: 4 meq/L (ref 3.5–5.1)
Sodium: 139 meq/L (ref 135–145)
Total Bilirubin: 0.6 mg/dL (ref 0.2–1.2)
Total Protein: 7.3 g/dL (ref 6.0–8.3)

## 2024-08-31 LAB — VITAMIN D 25 HYDROXY (VIT D DEFICIENCY, FRACTURES): VITD: 47.26 ng/mL (ref 30.00–100.00)

## 2024-08-31 LAB — MAGNESIUM: Magnesium: 1.9 mg/dL (ref 1.5–2.5)

## 2024-08-31 MED ORDER — IBANDRONATE SODIUM 150 MG PO TABS: 150.0000 mg | ORAL_TABLET | ORAL | 3 refills | Status: AC

## 2024-08-31 NOTE — Patient Instructions (Signed)
 Schedule your bone density for February   Check with your cardiology office about following up  Schedule follow up with gynecology   Remember to get an eye exam

## 2024-08-31 NOTE — Progress Notes (Signed)
 "  Complete physical exam  Patient: Allison Cannon   DOB: 1956-03-11   68 y.o. Female  MRN: [id number]  Subjective:    Chief Complaint  Patient presents with   Annual Exam    fasting   She is here for a complete physical exam.   Discussed the use of AI scribe software for clinical note transcription with the patient, who gave verbal consent to proceed.  History of Present Illness Allison Cannon is a 68 year old female who presents for an annual physical exam and follow-up on chronic health conditions.  Auditory dysfunction - Complete hearing loss in left ear following gamma knife procedure - Diagnosis confirmed by MRI and consultations with ENT and audiologist - Plans to trial special hearing aids in December  Bone health - On Boniva  for bone health without side effects - Last bone density test in February 2024  Cardiovascular status - Remote cardiology follow-up every quarter - Last EKG in November 2024 - No chest pain, palpitations, or shortness of breath  Preventive health maintenance - Up to date with gynecological exams, colonoscopy, and bone density tests - Last gynecological exam one year ago - Received all recommended vaccinations including pneumonia, RSV, and latest COVID booster - Considering checking MMR titer for immunity  Dermatologic surveillance - Dermatology follow-up every 15 months - No concerns from last dermatology visit in September  Ophthalmologic status - Plans to see eye doctor - No vision issues  Gastrointestinal and musculoskeletal status - No gastrointestinal symptoms - No joint issues - No recent falls - Active with no issues in chewing or swallowing  Vestibular dysfunction - Past course of steroids resolved balance issue related to dizziness  Recent illnesses - Had a cold two weeks ago - No other recent illnesses        Health Maintenance  Topic Date Due   DTaP/Tdap/Td vaccine (1 - Tdap) Never done   Breast Cancer Screening   11/20/2024   COVID-19 Vaccine (13 - Pfizer risk 2025-26 season) 02/01/2025   Medicare Annual Wellness Visit  08/10/2025   Colon Cancer Screening  12/15/2027   Pneumococcal Vaccine for age over 74  Completed   Flu Shot  Completed   DEXA scan (bone density measurement)  Completed   Hepatitis C Screening  Completed   Zoster (Shingles) Vaccine  Completed   Meningitis B Vaccine  Aged Out    Wears seatbelt always, uses sunscreen, smoke detectors in home and functioning, does not text while driving, feels safe in home environment.  Depression screening:    08/31/2024    9:32 AM 08/10/2024    8:50 AM 09/23/2023   10:30 AM  Depression screen PHQ 2/9  Decreased Interest 0 0 0  Down, Depressed, Hopeless 0 0 0  PHQ - 2 Score 0 0 0  Altered sleeping  1 0  Tired, decreased energy  0 0  Change in appetite  0 0  Feeling bad or failure about yourself   0 0  Trouble concentrating  0 0  Moving slowly or fidgety/restless  0 0  Suicidal thoughts  0 0  PHQ-9 Score  1  0   Difficult doing work/chores  Not difficult at all      Data saved with a previous flowsheet row definition   Anxiety Screening:    09/23/2023   10:30 AM  GAD 7 : Generalized Anxiety Score  Nervous, Anxious, on Edge 0  Control/stop worrying 0  Worry too much - different things 0  Trouble  relaxing 0  Restless 0  Easily annoyed or irritable 0  Afraid - awful might happen 0  Total GAD 7 Score 0     Patient Care Team: Lendia Boby CROME, NP-C as PCP - General (Family Medicine) Waddell Danelle ORN, MD as PCP - Cardiology (Cardiology) Patient, No Pcp Per (General Practice)   Outpatient Medications Prior to Visit  Medication Sig   acetaminophen  (TYLENOL ) 325 MG tablet Take 1-2 tablets (325-650 mg total) by mouth every 6 (six) hours as needed for mild pain (pain score 1-3 or temp > 100.5).   Estradiol  10 MCG TABS vaginal tablet Place 1 tablet (10 mcg total) vaginally at bedtime. Use every night for  2 weeks then twice a week  afterwards   ibuprofen  (ADVIL ) 200 MG tablet Take 2 tablets (400 mg total) by mouth every 8 (eight) hours as needed for mild pain (pain score 1-3).   Magnesium  250 MG TABS Take 240 mg by mouth 2 (two) times daily. Per patient taking 120 mg twice a day   [DISCONTINUED] ibandronate  (BONIVA ) 150 MG tablet Take 1 tablet (150 mg total) by mouth every 30 (thirty) days. Take in the morning with a full glass of water , on an empty stomach, and do not take anything else by mouth or lie down for the next 30 min.   [DISCONTINUED] Ascorbic Acid (VITAMIN C) 500 MG CAPS Take 500 mg by mouth daily.   No facility-administered medications prior to visit.    ROS Per HPI    Objective:    BP 122/76   Pulse 85   Temp 97.8 F (36.6 C) (Temporal)   Ht 5' 7 (1.702 m)   Wt 135 lb (61.2 kg)   SpO2 97%   BMI 21.14 kg/m  BP Readings from Last 3 Encounters:  08/31/24 122/76  08/10/24 100/78  11/16/23 124/72   Wt Readings from Last 3 Encounters:  08/31/24 135 lb (61.2 kg)  08/10/24 136 lb 9.6 oz (62 kg)  11/16/23 139 lb (63 kg)    Physical Exam Constitutional:      General: She is not in acute distress.    Appearance: She is not ill-appearing.  HENT:     Right Ear: Tympanic membrane, ear canal and external ear normal.     Left Ear: Tympanic membrane, ear canal and external ear normal.     Ears:     Comments: Small amount of cerumen at edge of right ear canal    Nose: Nose normal.     Mouth/Throat:     Mouth: Mucous membranes are moist.     Pharynx: Oropharynx is clear.  Eyes:     Extraocular Movements: Extraocular movements intact.     Conjunctiva/sclera: Conjunctivae normal.     Pupils: Pupils are equal, round, and reactive to light.  Neck:     Thyroid: No thyroid mass, thyromegaly or thyroid tenderness.  Cardiovascular:     Rate and Rhythm: Normal rate and regular rhythm.     Pulses: Normal pulses.     Heart sounds: Normal heart sounds.  Pulmonary:     Effort: Pulmonary effort is  normal.     Breath sounds: Normal breath sounds.  Abdominal:     General: Bowel sounds are normal.     Palpations: Abdomen is soft.     Tenderness: There is no abdominal tenderness. There is no right CVA tenderness, left CVA tenderness, guarding or rebound.  Musculoskeletal:        General: Normal range of motion.  Cervical back: Normal range of motion and neck supple. No tenderness.     Right lower leg: No edema.     Left lower leg: No edema.  Lymphadenopathy:     Cervical: No cervical adenopathy.  Skin:    General: Skin is warm and dry.     Findings: No lesion or rash.  Neurological:     General: No focal deficit present.     Mental Status: She is alert and oriented to person, place, and time.     Cranial Nerves: No cranial nerve deficit.     Sensory: No sensory deficit.     Motor: No weakness.     Gait: Gait normal.  Psychiatric:        Mood and Affect: Mood normal.        Behavior: Behavior normal.        Thought Content: Thought content normal.      Results for orders placed or performed in visit on 08/31/24  VITAMIN D  25 Hydroxy (Vit-D Deficiency, Fractures)  Result Value Ref Range   VITD 47.26 30.00 - 100.00 ng/mL  Magnesium   Result Value Ref Range   Magnesium  1.9 1.5 - 2.5 mg/dL  Lipid panel  Result Value Ref Range   Cholesterol 198 0 - 200 mg/dL   Triglycerides 23.9 0.0 - 149.0 mg/dL   HDL 896.99 >60.99 mg/dL   VLDL 84.7 0.0 - 59.9 mg/dL   LDL Cholesterol 80 0 - 99 mg/dL   Total CHOL/HDL Ratio 2    NonHDL 95.33   Comprehensive metabolic panel with GFR  Result Value Ref Range   Sodium 139 135 - 145 mEq/L   Potassium 4.0 3.5 - 5.1 mEq/L   Chloride 102 96 - 112 mEq/L   CO2 27 19 - 32 mEq/L   Glucose, Bld 106 (H) 70 - 99 mg/dL   BUN 13 6 - 23 mg/dL   Creatinine, Ser 9.27 0.40 - 1.20 mg/dL   Total Bilirubin 0.6 0.2 - 1.2 mg/dL   Alkaline Phosphatase 69 39 - 117 U/L   AST 19 0 - 37 U/L   ALT 17 0 - 35 U/L   Total Protein 7.3 6.0 - 8.3 g/dL   Albumin  4.6 3.5 - 5.2 g/dL   GFR 14.47 >39.99 mL/min   Calcium 9.6 8.4 - 10.5 mg/dL  CBC with Differential/Platelet  Result Value Ref Range   WBC 6.9 4.0 - 10.5 K/uL   RBC 4.95 3.87 - 5.11 Mil/uL   Hemoglobin 14.4 12.0 - 15.0 g/dL   HCT 57.0 63.9 - 53.9 %   MCV 86.7 78.0 - 100.0 fl   MCHC 33.5 30.0 - 36.0 g/dL   RDW 86.3 88.4 - 84.4 %   Platelets 247.0 150.0 - 400.0 K/uL   Neutrophils Relative % 71.6 43.0 - 77.0 %   Lymphocytes Relative 21.7 12.0 - 46.0 %   Monocytes Relative 5.0 3.0 - 12.0 %   Eosinophils Relative 1.1 0.0 - 5.0 %   Basophils Relative 0.6 0.0 - 3.0 %   Neutro Abs 4.9 1.4 - 7.7 K/uL   Lymphs Abs 1.5 0.7 - 4.0 K/uL   Monocytes Absolute 0.3 0.1 - 1.0 K/uL   Eosinophils Absolute 0.1 0.0 - 0.7 K/uL   Basophils Absolute 0.0 0.0 - 0.1 K/uL      Assessment & Plan:    Routine Health Maintenance and Physical Exam Problem List Items Addressed This Visit     Encounter for general adult medical examination with abnormal findings -  Primary   Estrogen deficiency   History of sudden cardiac arrest   Relevant Orders   CBC with Differential/Platelet (Completed)   Comprehensive metabolic panel with GFR (Completed)   Lipid panel (Completed)   Magnesium  (Completed)   Localized osteoporosis without current pathological fracture   Relevant Medications   ibandronate  (BONIVA ) 150 MG tablet   Other Relevant Orders   CBC with Differential/Platelet (Completed)   Comprehensive metabolic panel with GFR (Completed)   DG Bone Density   Other Visit Diagnoses       Hearing loss of left ear, unspecified hearing loss type         Magnesium  deficiency       Relevant Orders   Magnesium  (Completed)     Immunity status testing       Relevant Orders   Measles/Mumps/Rubella Immunity     Vitamin D  insufficiency       Relevant Orders   VITAMIN D  25 Hydroxy (Vit-D Deficiency, Fractures) (Completed)     Serum cholesterol elevated       Relevant Orders   Lipid panel (Completed)        Assessment and Plan Assessment & Plan Adult Wellness Visit Annual physical exam conducted. Preventive health care is up to date. Discussed the importance of regular gynecological exams due to risk of skin cancer and other conditions. Encouraged continuation of regular screenings for breast and pelvic exams. Discussed the importance of regular eye exams and dental check-ups. Reviewed the importance of staying active and maintaining a healthy lifestyle. - Ordered bone density scan and mammogram in three months - Encouraged scheduling gynecological exam - Encouraged scheduling eye exam - Encouraged scheduling dental check-up  Complete hearing loss, left ear Complete hearing loss in the left ear following gamma knife treatment. Audiologist confirmed expected outcome. Discussed options for hearing aids, including a hearing aid for the right ear or a device that magnifies sound in the left ear to be processed by the right ear. - Follow up with [facility] in December to try hearing aid options  Osteoporosis Currently on Boniva  with no side effects reported. She is a third of the way through a five-year course. Discussed the importance of continuing treatment to manage osteoporosis. - Refilled Boniva  prescription  History of cardiac arrest No current issues with chest pain, palpitations, or shortness of breath. Cardiologist monitoring remotely with no recent need for in-person visits. Last EKG was in November of last year. - Follow up with cardiology to determine if in-person visit is needed  Total serum cholesterol elevated - check lipid panel   Magnesium  deficiency - continue Mg supplement and check Mg level      Return in about 6 months (around 02/28/2025) for chronic health conditions.     Boby Mackintosh, NP-C   "

## 2024-09-01 ENCOUNTER — Ambulatory Visit: Payer: Self-pay | Admitting: Family Medicine

## 2024-09-01 LAB — MEASLES/MUMPS/RUBELLA IMMUNITY
Mumps IgG: 84.9 [AU]/ml
Rubella: 4.19 {index}
Rubeola IgG: >300 [AU]/ml

## 2024-09-04 NOTE — Telephone Encounter (Signed)
 fyi

## 2024-09-05 ENCOUNTER — Other Ambulatory Visit: Payer: Self-pay | Admitting: Family Medicine

## 2024-09-05 DIAGNOSIS — Z8674 Personal history of sudden cardiac arrest: Secondary | ICD-10-CM

## 2024-09-05 DIAGNOSIS — Z136 Encounter for screening for cardiovascular disorders: Secondary | ICD-10-CM

## 2024-09-11 ENCOUNTER — Ambulatory Visit (INDEPENDENT_AMBULATORY_CARE_PROVIDER_SITE_OTHER)

## 2024-09-11 DIAGNOSIS — R739 Hyperglycemia, unspecified: Secondary | ICD-10-CM | POA: Diagnosis not present

## 2024-09-11 LAB — POCT GLYCOSYLATED HEMOGLOBIN (HGB A1C)
HbA1c POC (<> result, manual entry): 5.3 % (ref 4.0–5.6)
Hemoglobin A1C: 5.3 % (ref 4.0–5.6)

## 2024-09-11 NOTE — Progress Notes (Unsigned)
 Pt came in for A1C per provider request. A1C was completed, result 5.3.

## 2024-09-19 ENCOUNTER — Ambulatory Visit

## 2024-09-19 DIAGNOSIS — I469 Cardiac arrest, cause unspecified: Secondary | ICD-10-CM | POA: Diagnosis not present

## 2024-09-20 LAB — CUP PACEART REMOTE DEVICE CHECK
Battery Remaining Longevity: 74 mo
Battery Remaining Percentage: 61 %
Battery Voltage: 2.98 V
Brady Statistic RV Percent Paced: 1 %
Date Time Interrogation Session: 20251202020322
HighPow Impedance: 72 Ohm
Implantable Lead Connection Status: 753985
Implantable Lead Implant Date: 20210603
Implantable Lead Location: 753860
Implantable Pulse Generator Implant Date: 20210603
Lead Channel Impedance Value: 400 Ohm
Lead Channel Pacing Threshold Amplitude: 0.75 V
Lead Channel Pacing Threshold Pulse Width: 0.5 ms
Lead Channel Sensing Intrinsic Amplitude: 12 mV
Lead Channel Setting Pacing Amplitude: 2.5 V
Lead Channel Setting Pacing Pulse Width: 0.5 ms
Lead Channel Setting Sensing Sensitivity: 0.5 mV
Pulse Gen Serial Number: 111007674

## 2024-09-22 ENCOUNTER — Ambulatory Visit: Payer: Self-pay | Admitting: Internal Medicine

## 2024-09-22 NOTE — Progress Notes (Signed)
 Remote ICD Transmission

## 2024-10-01 NOTE — Progress Notes (Unsigned)
 Cardiology Office Note Date:  10/01/2024  Patient ID:  Allison Cannon, Allison Cannon 11-26-1955, MRN [id number] PCP:  Lendia Boby CROME, NP-C  Cardiologist:  Dr. Verlin Electrophysiologist: Dr. Waddell   Chief Complaint:    *** annual visit  History of Present Illness: Allison Cannon is a 68 y.o. female with no known PMHx >>> until June 2021 suffered cardiac arrest   Admitted 03/19/21 Allison Cannon was at the [facility] exercising when she was witnessed to collapse.  EMS record reviewed On their arrival FD was on scene, [t was conscious though with snoring type respirations Reportedly she had immediate CPR started and once AED applied was advised shock and she was given one shock  By AED, afterwards she became conscious though with body jerking, throwing her head back, incontinent of urine and spastic type movements.  She had Left gaze preference, displayed lip smacking, sticking tongue out. She was found in SR and maintained SR through their tracings, rates low 100's, and stable BP. Code stroke was activated, neurology and cardiology consulted. Neurology note from today is incomplete though appears they feel most likely the patient suffered from a syncopal episode secondary to an acute arrhythmia, followed by syncopal seizure. EEG is normal, which militates against epilepsy as the primary etiology for her presentation.   MRI brain is negative for stroke. 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  LVEF 70-75%, hyperdynamic C.MRI findings c/w hypertensive heart disease Cath with NOD Underwent ICD implant  ---------------  Seeing Dr. Algie annually since then Most recently I saw her Nov 2024 She continues to do very well Very active, goes to the [employer]/participates in [employer] classes. No CP, palpitations or cardiac awareness No SOB, DOE No dizzy spells, near syncope or syncope Sees her PMD, labs done there  Following  with ENT/audiology with a dx of Vestibular schwannoma >>gamma knife procedure on 02/11/24, moderately-severe sensorineural hearing loss   TODAY  *** symptoms, syncope *** arrhythmia?   Device information SJM single chamber ICD implanted 03/21/2020 Secondary prevention   Past Medical History:  Diagnosis Date   AICD (automatic cardioverter/defibrillator) present    Arthritis of left hip    Myocardial infarction [facility])    Cardiac arrest 6/21   Osteoporosis    PONV (postoperative nausea and vomiting)    Seborrheic keratoses 12/30/2022   Sleep apnea     Past Surgical History:  Procedure Laterality Date   CHOLECYSTECTOMY     ICD IMPLANT N/A 03/21/2020   Procedure: ICD IMPLANT;  Surgeon: Waddell Danelle ORN, MD;  Location: [facility] INVASIVE CV LAB;  Service: Cardiovascular;  Laterality: N/A;   KNEE ARTHROSCOPY     x 2   LEFT HEART CATH AND CORONARY ANGIOGRAPHY N/A 03/21/2020   Procedure: LEFT HEART CATH AND CORONARY ANGIOGRAPHY;  Surgeon: Burnard Debby LABOR, MD;  Location: [facility] INVASIVE CV LAB;  Service: Cardiovascular;  Laterality: N/A;   SP CHOLECYSTOMY     TOTAL HIP ARTHROPLASTY Left 01/30/2021   Procedure: TOTAL HIP ARTHROPLASTY ANTERIOR APPROACH;  Surgeon: Fidel Rogue, MD;  Location: [facility] ORS;  Service: Orthopedics;  Laterality: Left;    Current Outpatient Medications  Medication Sig Dispense Refill   acetaminophen  (TYLENOL ) 325 MG tablet Take 1-2 tablets (325-650 mg total) by mouth every 6 (six) hours as needed for mild pain (pain score 1-3 or temp > 100.5). 100 tablet 0   Estradiol  10 MCG TABS vaginal tablet Place 1 tablet (10 mcg total) vaginally  at bedtime. Use every night for  2 weeks then twice a week afterwards 30 tablet 12   ibandronate  (BONIVA ) 150 MG tablet Take 1 tablet (150 mg total) by mouth every 30 (thirty) days. Take in the morning with a full glass of water , on an empty stomach, and do not take anything else by mouth or lie down for the next 30 min. 3 tablet 3   ibuprofen  (ADVIL )  200 MG tablet Take 2 tablets (400 mg total) by mouth every 8 (eight) hours as needed for mild pain (pain score 1-3). 30 tablet 0   Magnesium  250 MG TABS Take 240 mg by mouth 2 (two) times daily. Per patient taking 120 mg twice a day     No current facility-administered medications for this visit.    Allergies:   Patient has no known allergies.   Social History:  The patient  reports that she has never smoked. She has never used smokeless tobacco. She reports current alcohol  use of about 1.0 - 5.0 standard drink of alcohol  per week. She reports that she does not use drugs.   Family History:  The patient's family history includes Breast cancer (age of onset: 74) in her mother; Cancer in her brother; Colon cancer (age of onset: 75) in her sister; Colon polyps in her brother and brother; Stroke in her father and mother.  ROS:  Please see the history of present illness.    All other systems are reviewed and otherwise negative.   PHYSICAL EXAM:  VS:  There were no vitals taken for this visit. BMI: There is no height or weight on file to calculate BMI. Well nourished, well developed, in no acute distress HEENT: normocephalic, atraumatic Neck: no JVD, carotid bruits or masses Cardiac: *** RRR; no significant murmurs, no rubs, or gallops Lungs: *** CTA b/l, no wheezing, rhonchi or rales Abd: soft, nontender MS: L forearm hard cast Ext: *** no edema Skin: warm and dry, no rash Neuro:  No gross deficits appreciated Psych: euthymic mood, full affect  *** ICD site is stable, no tethering or discomfort   EKG:  done today and reviewed by myself ***  Device interrogation done today and reviewed by myself:  *** Battery and lead measurements are good *** No arrythmias     03/21/2020: LHC  Normal coronary arteries with a very dominant right coronary system.   LV EDP 9 mmHg.   RECOMMENDATION: Catheterization results were reviewed with Dr. Cathlyn Birmingham.  With normal coronary arteries, the  patient will be transported to the EP lab for placement of ICD.   03/21/2020: c.MRI IMPRESSION: 1. Normal left ventricular size with moderate basal septal hypertrophy and mild hypertrophy of the remaining myocardium and hyperdynamic systolic function (LVEF = 74%). There are no regional wall motion abnormalities and no late gadolinium enhancement in the left ventricular myocardium. Extracellular volume is increased at 33% (normal < 25%).   2. Normal right ventricular size, thickness and systolic function (LVEF = 65%). There are no regional wall motion abnormalities.   3. Mildly dilated left atrium. normal right atrial size.   4. Normal size of the aortic root, ascending aorta and pulmonary artery.   5. Mild mitral regurgitation.   6. Normal pericardium.  Trivial pericardial effusion.   These findings are consistent with hypertensive heart disease. There is no evidence for arrhythmogenic right ventricular cardiomyopathy or infiltrative cardiomyopathy.     03/19/2020: TTE IMPRESSIONS  1. Left ventricular ejection fraction, by estimation, is 70 to 75%. The  left ventricle has hyperdynamic function. The left ventricle has no  regional wall motion abnormalities. Left ventricular diastolic parameters  were normal.   2. Right ventricular systolic function is normal. The right ventricular  size is normal.   3. Left atrial size was mildly dilated.   4. Right atrial size was mildly dilated.   5. The mitral valve is normal in structure. Trivial mitral valve  regurgitation. No evidence of mitral stenosis.   6. The aortic valve is tricuspid. Aortic valve regurgitation is not  visualized. No aortic stenosis is present.   7. Aortic dilatation noted. There is borderline dilatation of the  ascending aorta measuring 37 mm.   8. The inferior vena cava is normal in size with <50% respiratory  variability, suggesting right atrial pressure of 8 mmHg.    Recent Labs: 08/31/2024: ALT 17; BUN  13; Creatinine, Ser 0.72; Hemoglobin 14.4; Magnesium  1.9; Platelets 247.0; Potassium 4.0; Sodium 139  08/31/2024: Cholesterol 198; HDL 103.00; LDL Cholesterol 80; Total CHOL/HDL Ratio 2; Triglycerides 76.0; VLDL 15.2   CrCl cannot be calculated (Patient's most recent lab result is older than the maximum 21 days allowed.).   Wt Readings from Last 3 Encounters:  08/31/24 135 lb (61.2 kg)  08/10/24 136 lb 9.6 oz (62 kg)  11/16/23 139 lb (63 kg)     Other studies reviewed: Additional studies/records reviewed today include: summarized above  ASSESSMENT AND PLAN:  1. Cardiac arrest hx     Collapsed at the gym, presumed VT/VF with AED advised shock      *** No recurrent syncope *** No VT  2. ICD     *** Intact function     *** no programming changes made     Disposition: remotes as usual, back in clinic in ***, sooner if needed  Current medicines are reviewed at length with the patient today.  The patient did not have any concerns regarding medicines.  Bonney Charlies Arthur, PA-C 10/01/2024 2:55 PM     [facility] [address] [place] [address] [phone] (office)  [phone] (fax)

## 2024-10-03 ENCOUNTER — Ambulatory Visit: Attending: Physician Assistant | Admitting: Physician Assistant

## 2024-10-03 VITALS — BP 112/62 | Ht 67.0 in | Wt 135.0 lb

## 2024-10-03 DIAGNOSIS — Z95810 Presence of automatic (implantable) cardiac defibrillator: Secondary | ICD-10-CM

## 2024-10-03 DIAGNOSIS — I469 Cardiac arrest, cause unspecified: Secondary | ICD-10-CM | POA: Insufficient documentation

## 2024-10-03 LAB — CUP PACEART INCLINIC DEVICE CHECK
Battery Remaining Longevity: 75 mo
Brady Statistic RV Percent Paced: 0 %
Date Time Interrogation Session: 20251216082858
HighPow Impedance: 69.75 Ohm
Implantable Lead Connection Status: 753985
Implantable Lead Implant Date: 20210603
Implantable Lead Location: 753860
Implantable Pulse Generator Implant Date: 20210603
Lead Channel Impedance Value: 450 Ohm
Lead Channel Pacing Threshold Amplitude: 0.75 V
Lead Channel Pacing Threshold Amplitude: 0.75 V
Lead Channel Pacing Threshold Pulse Width: 0.5 ms
Lead Channel Pacing Threshold Pulse Width: 0.5 ms
Lead Channel Sensing Intrinsic Amplitude: 12 mV
Lead Channel Setting Pacing Amplitude: 2.5 V
Lead Channel Setting Pacing Pulse Width: 0.5 ms
Lead Channel Setting Sensing Sensitivity: 0.5 mV
Pulse Gen Serial Number: 111007674

## 2024-10-03 NOTE — Patient Instructions (Signed)
 Medication Instructions:   Your physician recommends that you continue on your current medications as directed. Please refer to the Current Medication list given to you today.  *If you need a refill on your cardiac medications before your next appointment, please call your pharmacy*  Lab Work: NONE ORDERED  TODAY   If you have labs (blood work) drawn today and your tests are completely normal, you will receive your results only by: MyChart Message (if you have MyChart) OR A paper copy in the mail If you have any lab test that is abnormal or we need to change your treatment, we will call you to review the results.  Testing/Procedures: NONE ORDERED  TODAY    Follow-Up: At [facility] you and your health needs are our priority.  As part of our continuing mission to provide you with exceptional heart care, our providers are all part of one team.  This team includes your primary Cardiologist (physician) and Advanced Practice Providers or APPs (Physician Assistants and Nurse Practitioners) who all work together to provide you with the care you need, when you need it.  Your next appointment:   1 year(s)    Provider:   Agatha Horsfall, MD    We recommend signing up for the patient portal called "MyChart".  Sign up information is provided on this After Visit Summary.  MyChart is used to connect with patients for Virtual Visits (Telemedicine).  Patients are able to view lab/test results, encounter notes, upcoming appointments, etc.  Non-urgent messages can be sent to your provider as well.   To learn more about what you can do with MyChart, go to [email or web address].   Other Instructions

## 2024-10-17 ENCOUNTER — Ambulatory Visit: Payer: Self-pay | Admitting: Cardiology

## 2024-12-15 ENCOUNTER — Other Ambulatory Visit

## 2024-12-19 ENCOUNTER — Encounter

## 2025-02-28 ENCOUNTER — Ambulatory Visit: Admitting: Family Medicine

## 2025-03-20 ENCOUNTER — Encounter

## 2025-06-19 ENCOUNTER — Encounter

## 2025-09-18 ENCOUNTER — Encounter

## 2025-12-18 ENCOUNTER — Encounter

## 2026-03-19 ENCOUNTER — Encounter
